# Patient Record
Sex: Male | Born: 1960 | Race: Black or African American | Hispanic: No | Marital: Single | Smoking: Current every day smoker
Health system: Southern US, Community
[De-identification: ages and names within clinical notes are randomized; demographics above are authoritative.]

## PROBLEM LIST (undated history)

## (undated) ENCOUNTER — Emergency Department (HOSPITAL_COMMUNITY): Payer: Medicare Other | Source: Home / Self Care

## (undated) DIAGNOSIS — D75829 Heparin-induced thrombocytopenia, unspecified: Secondary | ICD-10-CM

## (undated) DIAGNOSIS — M623 Immobility syndrome (paraplegic): Secondary | ICD-10-CM

## (undated) DIAGNOSIS — N319 Neuromuscular dysfunction of bladder, unspecified: Secondary | ICD-10-CM

## (undated) DIAGNOSIS — J1520 Pneumonia due to staphylococcus, unspecified: Secondary | ICD-10-CM

## (undated) DIAGNOSIS — F29 Unspecified psychosis not due to a substance or known physiological condition: Secondary | ICD-10-CM

## (undated) DIAGNOSIS — D61818 Other pancytopenia: Secondary | ICD-10-CM

## (undated) DIAGNOSIS — G839 Paralytic syndrome, unspecified: Secondary | ICD-10-CM

## (undated) DIAGNOSIS — D7582 Heparin induced thrombocytopenia (HIT): Secondary | ICD-10-CM

## (undated) DIAGNOSIS — L89159 Pressure ulcer of sacral region, unspecified stage: Secondary | ICD-10-CM

## (undated) DIAGNOSIS — M869 Osteomyelitis, unspecified: Secondary | ICD-10-CM

## (undated) HISTORY — PX: SMALL INTESTINE SURGERY: SHX150

## (undated) HISTORY — PX: HIP ARTHROTOMY: SUR105

---

## 2011-01-24 ENCOUNTER — Inpatient Hospital Stay (HOSPITAL_COMMUNITY)
Admission: EM | Admit: 2011-01-24 | Discharge: 2011-01-27 | DRG: 592 | Disposition: A | Discharge status: Home Health | Payer: Medicare Other | Attending: Internal Medicine | Admitting: Internal Medicine

## 2011-01-24 ENCOUNTER — Emergency Department (HOSPITAL_COMMUNITY): Payer: Medicare Other

## 2011-01-24 ENCOUNTER — Encounter: Payer: Self-pay | Admitting: Internal Medicine

## 2011-01-24 DIAGNOSIS — L8995 Pressure ulcer of unspecified site, unstageable: Secondary | ICD-10-CM | POA: Diagnosis present

## 2011-01-24 DIAGNOSIS — Z87828 Personal history of other (healed) physical injury and trauma: Secondary | ICD-10-CM

## 2011-01-24 DIAGNOSIS — G8929 Other chronic pain: Secondary | ICD-10-CM | POA: Diagnosis present

## 2011-01-24 DIAGNOSIS — Z228 Carrier of other infectious diseases: Secondary | ICD-10-CM

## 2011-01-24 DIAGNOSIS — M62838 Other muscle spasm: Secondary | ICD-10-CM | POA: Diagnosis present

## 2011-01-24 DIAGNOSIS — T45515A Adverse effect of anticoagulants, initial encounter: Secondary | ICD-10-CM | POA: Diagnosis present

## 2011-01-24 DIAGNOSIS — L89609 Pressure ulcer of unspecified heel, unspecified stage: Secondary | ICD-10-CM | POA: Diagnosis present

## 2011-01-24 DIAGNOSIS — F29 Unspecified psychosis not due to a substance or known physiological condition: Secondary | ICD-10-CM | POA: Diagnosis present

## 2011-01-24 DIAGNOSIS — L8994 Pressure ulcer of unspecified site, stage 4: Secondary | ICD-10-CM | POA: Diagnosis present

## 2011-01-24 DIAGNOSIS — G8220 Paraplegia, unspecified: Secondary | ICD-10-CM | POA: Diagnosis present

## 2011-01-24 DIAGNOSIS — L89109 Pressure ulcer of unspecified part of back, unspecified stage: Principal | ICD-10-CM | POA: Diagnosis present

## 2011-01-24 DIAGNOSIS — L89209 Pressure ulcer of unspecified hip, unspecified stage: Secondary | ICD-10-CM | POA: Diagnosis present

## 2011-01-24 DIAGNOSIS — N390 Urinary tract infection, site not specified: Secondary | ICD-10-CM

## 2011-01-24 LAB — DIFFERENTIAL
Basophils Absolute: 0 10*3/uL (ref 0.0–0.1)
Basophils Relative: 0 % (ref 0–1)
Eosinophils Absolute: 0.1 10*3/uL (ref 0.0–0.7)
Eosinophils Relative: 2 % (ref 0–5)
Lymphs Abs: 1.1 10*3/uL (ref 0.7–4.0)
Neutrophils Relative %: 66 % (ref 43–77)

## 2011-01-24 LAB — URINALYSIS, ROUTINE W REFLEX MICROSCOPIC
Glucose, UA: NEGATIVE mg/dL
Nitrite: POSITIVE — AB
Protein, ur: NEGATIVE mg/dL
pH: 7 (ref 5.0–8.0)

## 2011-01-24 LAB — CBC
Platelets: 111 10*3/uL — ABNORMAL LOW (ref 150–400)
RBC: 3.61 MIL/uL — ABNORMAL LOW (ref 4.22–5.81)
RDW: 18.4 % — ABNORMAL HIGH (ref 11.5–15.5)
WBC: 4.3 10*3/uL (ref 4.0–10.5)

## 2011-01-24 LAB — BASIC METABOLIC PANEL
CO2: 25 mEq/L (ref 19–32)
Chloride: 105 mEq/L (ref 96–112)
Potassium: 3.3 mEq/L — ABNORMAL LOW (ref 3.5–5.1)
Sodium: 139 mEq/L (ref 135–145)

## 2011-01-24 LAB — URINE MICROSCOPIC-ADD ON
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
## 2011-01-25 DIAGNOSIS — L89609 Pressure ulcer of unspecified heel, unspecified stage: Secondary | ICD-10-CM

## 2011-01-25 DIAGNOSIS — L89209 Pressure ulcer of unspecified hip, unspecified stage: Secondary | ICD-10-CM

## 2011-01-25 DIAGNOSIS — L8990 Pressure ulcer of unspecified site, unspecified stage: Secondary | ICD-10-CM

## 2011-01-25 DIAGNOSIS — R509 Fever, unspecified: Secondary | ICD-10-CM

## 2011-01-26 ENCOUNTER — Inpatient Hospital Stay (HOSPITAL_COMMUNITY): Payer: Medicare Other

## 2011-01-26 LAB — COMPREHENSIVE METABOLIC PANEL
ALT: 10 U/L (ref 0–53)
AST: 15 U/L (ref 0–37)
Albumin: 2.3 g/dL — ABNORMAL LOW (ref 3.5–5.2)
Alkaline Phosphatase: 131 U/L — ABNORMAL HIGH (ref 39–117)
Chloride: 105 mEq/L (ref 96–112)
Potassium: 4.5 mEq/L (ref 3.5–5.1)
Sodium: 137 mEq/L (ref 135–145)
Total Bilirubin: 0.3 mg/dL (ref 0.3–1.2)
Total Protein: 6.6 g/dL (ref 6.0–8.3)

## 2011-01-26 LAB — CBC
HCT: 28.7 % — ABNORMAL LOW (ref 39.0–52.0)
MCHC: 32.8 g/dL (ref 30.0–36.0)
Platelets: 101 10*3/uL — ABNORMAL LOW (ref 150–400)
RDW: 18.8 % — ABNORMAL HIGH (ref 11.5–15.5)
WBC: 4.8 10*3/uL (ref 4.0–10.5)

## 2011-01-26 LAB — PREALBUMIN: Prealbumin: 12.2 mg/dL — ABNORMAL LOW (ref 17.0–34.0)

## 2011-01-26 LAB — URINE CULTURE: Colony Count: >=100000

## 2011-01-27 ENCOUNTER — Inpatient Hospital Stay (HOSPITAL_COMMUNITY): Payer: Medicare Other

## 2011-01-27 DIAGNOSIS — N390 Urinary tract infection, site not specified: Secondary | ICD-10-CM

## 2011-01-27 LAB — BASIC METABOLIC PANEL
BUN: 14 mg/dL (ref 6–23)
Calcium: 9 mg/dL (ref 8.4–10.5)
Chloride: 104 mEq/L (ref 96–112)
Creatinine, Ser: <0.47 mg/dL — ABNORMAL LOW (ref 0.50–1.35)

## 2011-01-27 LAB — CBC
HCT: 28.6 % — ABNORMAL LOW (ref 39.0–52.0)
MCH: 26.5 pg (ref 26.0–34.0)
MCHC: 32.5 g/dL (ref 30.0–36.0)
MCV: 81.5 fL (ref 78.0–100.0)
Platelets: 133 10*3/uL — ABNORMAL LOW (ref 150–400)
RDW: 18.6 % — ABNORMAL HIGH (ref 11.5–15.5)
WBC: 4.6 10*3/uL (ref 4.0–10.5)

## 2011-01-30 LAB — CULTURE, BLOOD (ROUTINE X 2)
Culture  Setup Time: 201208101639
Culture  Setup Time: 201208101639
Culture: NO GROWTH
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
## 2013-04-09 ENCOUNTER — Encounter (HOSPITAL_COMMUNITY): Payer: Self-pay | Admitting: Emergency Medicine

## 2013-04-09 ENCOUNTER — Emergency Department (HOSPITAL_COMMUNITY)
Admission: EM | Admit: 2013-04-09 | Discharge: 2013-04-09 | Disposition: A | Discharge status: Skilled Nursing Facility | Payer: Medicare Other | Attending: Emergency Medicine | Admitting: Emergency Medicine

## 2013-04-09 DIAGNOSIS — T83010A Breakdown (mechanical) of cystostomy catheter, initial encounter: Secondary | ICD-10-CM

## 2013-04-09 DIAGNOSIS — Z880 Allergy status to penicillin: Secondary | ICD-10-CM | POA: Insufficient documentation

## 2013-04-09 DIAGNOSIS — F17200 Nicotine dependence, unspecified, uncomplicated: Secondary | ICD-10-CM | POA: Insufficient documentation

## 2013-04-09 DIAGNOSIS — Z466 Encounter for fitting and adjustment of urinary device: Secondary | ICD-10-CM | POA: Insufficient documentation

## 2013-04-09 DIAGNOSIS — M6289 Other specified disorders of muscle: Secondary | ICD-10-CM | POA: Insufficient documentation

## 2013-04-09 HISTORY — DX: Immobility syndrome (paraplegic): M62.3

## 2013-04-09 NOTE — ED Notes (Signed)
PT[facility] called  

## 2013-04-09 NOTE — ED Notes (Signed)
Pt SP catheter replaced by Dr Judd Lien. ([id number]).

## 2013-04-09 NOTE — ED Notes (Signed)
Pt from [facility], c/o needing his SP catheter changed. Pt reports that he has a UTI that he is being tx for at this time. Pt adds that he is having L side pain from the UTI. Pt requests that his SP catheter be changed. Pt is A&O and in NAD

## 2013-04-09 NOTE — ED Notes (Signed)
Per [facility], pt from [facility], c/o urination from penis, with suprapubic catheter in place. Pt currently being tx for UTI. Pt is a paraplegic pt. Pt  Is A&O and in NAD

## 2013-04-09 NOTE — ED Notes (Signed)
Bed: WA21 Expected date:  Expected time:  Means of arrival:  Comments: EMS Flank Pain with sp catheter
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
## 2014-05-23 ENCOUNTER — Encounter (HOSPITAL_COMMUNITY): Payer: Self-pay | Admitting: Emergency Medicine

## 2014-05-23 ENCOUNTER — Emergency Department (HOSPITAL_COMMUNITY)
Admission: EM | Admit: 2014-05-23 | Discharge: 2014-05-24 | Disposition: A | Discharge status: Home / Self Care | Payer: Medicare Other | Attending: Emergency Medicine | Admitting: Emergency Medicine

## 2014-05-23 DIAGNOSIS — Z880 Allergy status to penicillin: Secondary | ICD-10-CM | POA: Insufficient documentation

## 2014-05-23 DIAGNOSIS — Z8669 Personal history of other diseases of the nervous system and sense organs: Secondary | ICD-10-CM | POA: Diagnosis not present

## 2014-05-23 DIAGNOSIS — R109 Unspecified abdominal pain: Secondary | ICD-10-CM

## 2014-05-23 DIAGNOSIS — Z720 Tobacco use: Secondary | ICD-10-CM | POA: Diagnosis not present

## 2014-05-23 DIAGNOSIS — K922 Gastrointestinal hemorrhage, unspecified: Secondary | ICD-10-CM | POA: Insufficient documentation

## 2014-05-23 DIAGNOSIS — Z792 Long term (current) use of antibiotics: Secondary | ICD-10-CM | POA: Diagnosis not present

## 2014-05-23 DIAGNOSIS — Z79899 Other long term (current) drug therapy: Secondary | ICD-10-CM | POA: Insufficient documentation

## 2014-05-23 HISTORY — DX: Paralytic syndrome, unspecified: G83.9

## 2014-05-23 LAB — COMPREHENSIVE METABOLIC PANEL
ALT: 15 U/L (ref 0–53)
AST: 18 U/L (ref 0–37)
Albumin: 3.2 g/dL — ABNORMAL LOW (ref 3.5–5.2)
Alkaline Phosphatase: 110 U/L (ref 39–117)
Anion gap: 16 — ABNORMAL HIGH (ref 5–15)
BUN: 14 mg/dL (ref 6–23)
CALCIUM: 9.8 mg/dL (ref 8.4–10.5)
CO2: 22 meq/L (ref 19–32)
CREATININE: 0.58 mg/dL (ref 0.50–1.35)
Chloride: 97 mEq/L (ref 96–112)
GFR calc Af Amer: >90 mL/min (ref >90)
GLUCOSE: 99 mg/dL (ref 70–99)
Potassium: 3.5 mEq/L — ABNORMAL LOW (ref 3.7–5.3)
Sodium: 135 mEq/L — ABNORMAL LOW (ref 137–147)
Total Bilirubin: 0.6 mg/dL (ref 0.3–1.2)
Total Protein: 7.7 g/dL (ref 6.0–8.3)

## 2014-05-23 LAB — CBC WITH DIFFERENTIAL/PLATELET
Basophils Absolute: 0 10*3/uL (ref 0.0–0.1)
Basophils Relative: 0 % (ref 0–1)
EOS PCT: 1 % (ref 0–5)
Eosinophils Absolute: 0.1 10*3/uL (ref 0.0–0.7)
HEMATOCRIT: 38.3 % — AB (ref 39.0–52.0)
Hemoglobin: 12.6 g/dL — ABNORMAL LOW (ref 13.0–17.0)
LYMPHS ABS: 0.9 10*3/uL (ref 0.7–4.0)
Lymphocytes Relative: 11 % — ABNORMAL LOW (ref 12–46)
MCH: 26.6 pg (ref 26.0–34.0)
MCHC: 32.9 g/dL (ref 30.0–36.0)
MCV: 80.8 fL (ref 78.0–100.0)
MONOS PCT: 5 % (ref 3–12)
Monocytes Absolute: 0.4 10*3/uL (ref 0.1–1.0)
Neutro Abs: 6.7 10*3/uL (ref 1.7–7.7)
Neutrophils Relative %: 83 % — ABNORMAL HIGH (ref 43–77)
Platelets: 122 10*3/uL — ABNORMAL LOW (ref 150–400)
RBC: 4.74 MIL/uL (ref 4.22–5.81)
RDW: 15.8 % — ABNORMAL HIGH (ref 11.5–15.5)
WBC: 8.1 10*3/uL (ref 4.0–10.5)

## 2014-05-23 LAB — URINALYSIS, ROUTINE W REFLEX MICROSCOPIC
Bilirubin Urine: NEGATIVE
Glucose, UA: NEGATIVE mg/dL
Ketones, ur: NEGATIVE mg/dL
Nitrite: POSITIVE — AB
PH: 5.5 (ref 5.0–8.0)
PROTEIN: NEGATIVE mg/dL
Specific Gravity, Urine: 1.013 (ref 1.005–1.030)
Urobilinogen, UA: 0.2 mg/dL (ref 0.0–1.0)

## 2014-05-23 LAB — I-STAT CG4 LACTIC ACID, ED: LACTIC ACID, VENOUS: 1.15 mmol/L (ref 0.5–2.2)

## 2014-05-23 LAB — URINE MICROSCOPIC-ADD ON

## 2014-05-23 LAB — TYPE AND SCREEN
ABO/RH(D): A NEG
Antibody Screen: NEGATIVE

## 2014-05-23 MED ORDER — FENTANYL CITRATE 0.05 MG/ML IJ SOLN
50.0000 ug | Freq: Once | INTRAMUSCULAR | Status: AC
  Administered 2014-05-23: 50 ug via INTRAVENOUS
  Filled 2014-05-23: qty 2

## 2014-05-23 MED ORDER — SODIUM CHLORIDE 0.9 % IV SOLN
1000.0000 mL | Freq: Once | INTRAVENOUS | Status: AC
  Administered 2014-05-23: 1000 mL via INTRAVENOUS

## 2014-05-23 MED ORDER — FENTANYL CITRATE 0.05 MG/ML IJ SOLN
50.0000 ug | Freq: Once | INTRAMUSCULAR | Status: AC
  Administered 2014-05-23: 50 ug via INTRAVENOUS

## 2014-05-23 MED ORDER — ONDANSETRON HCL 4 MG/2ML IJ SOLN
4.0000 mg | Freq: Once | INTRAMUSCULAR | Status: AC
  Administered 2014-05-23: 4 mg via INTRAVENOUS
  Filled 2014-05-23: qty 2

## 2014-05-23 MED ORDER — FENTANYL CITRATE 0.05 MG/ML IJ SOLN
100.0000 ug | Freq: Once | INTRAMUSCULAR | Status: DC
  Filled 2014-05-23: qty 2

## 2014-05-23 NOTE — ED Notes (Signed)
Report to tiffany, rn.  Pt care transferred 

## 2014-05-23 NOTE — ED Notes (Signed)
Per ems, called to patient facility for multiple episodes of coffee ground emesis since 5pm. With abdominal pain.  Pt arrives awake, alert, oriented, c/o abdominal pain
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
## 2014-05-24 ENCOUNTER — Telehealth: Payer: Self-pay | Admitting: *Deleted

## 2014-05-24 ENCOUNTER — Encounter (HOSPITAL_COMMUNITY): Payer: Self-pay

## 2014-05-24 ENCOUNTER — Emergency Department (HOSPITAL_COMMUNITY): Payer: Medicare Other

## 2014-05-24 DIAGNOSIS — K922 Gastrointestinal hemorrhage, unspecified: Secondary | ICD-10-CM | POA: Diagnosis not present

## 2014-05-24 LAB — ABO/RH: ABO/RH(D): A NEG

## 2014-05-24 MED ORDER — SODIUM CHLORIDE 0.9 % IV SOLN
8.0000 mg/h | INTRAVENOUS | Status: DC
  Administered 2014-05-24: 8 mg/h via INTRAVENOUS
  Filled 2014-05-24 (×2): qty 80

## 2014-05-24 MED ORDER — PANTOPRAZOLE SODIUM 40 MG IV SOLR: 40.0000 mg | Freq: Two times a day (BID) | INTRAVENOUS | Status: DC

## 2014-05-24 MED ORDER — MORPHINE SULFATE 4 MG/ML IJ SOLN
4.0000 mg | Freq: Once | INTRAMUSCULAR | Status: AC
  Administered 2014-05-24: 4 mg via INTRAVENOUS
  Filled 2014-05-24: qty 1

## 2014-05-24 MED ORDER — IOHEXOL 300 MG/ML  SOLN
25.0000 mL | Freq: Once | INTRAMUSCULAR | Status: AC | PRN
  Administered 2014-05-24: 25 mL via ORAL

## 2014-05-24 MED ORDER — SODIUM CHLORIDE 0.9 % IV SOLN
80.0000 mg | Freq: Once | INTRAVENOUS | Status: AC
  Administered 2014-05-24: 80 mg via INTRAVENOUS
  Filled 2014-05-24: qty 80

## 2014-05-24 MED ORDER — IOHEXOL 300 MG/ML  SOLN
100.0000 mL | Freq: Once | INTRAMUSCULAR | Status: AC | PRN
  Administered 2014-05-24: 100 mL via INTRAVENOUS

## 2014-05-24 MED ORDER — ONDANSETRON HCL 4 MG/2ML IJ SOLN
4.0000 mg | Freq: Once | INTRAMUSCULAR | Status: AC
  Administered 2014-05-24: 4 mg via INTRAVENOUS
  Filled 2014-05-24: qty 2

## 2014-05-24 MED ORDER — POTASSIUM CHLORIDE IN NACL 20-0.9 MEQ/L-% IV SOLN
Freq: Once | INTRAVENOUS | Status: AC
  Administered 2014-05-24: 01:00:00 via INTRAVENOUS
  Filled 2014-05-24: qty 1000

## 2014-05-24 NOTE — ED Notes (Signed)
Dr. Glick at bedside.  

## 2014-05-24 NOTE — Discharge Instructions (Signed)
Return if symptoms are getting worse.   Abdominal Pain Many things can cause abdominal pain. Usually, abdominal pain is not caused by a disease and will improve without treatment. It can often be observed and treated at home. Your health care provider will do a physical exam and possibly order blood tests and X-rays to help determine the seriousness of your pain. However, in many cases, more time must pass before a clear cause of the pain can be found. Before that point, your health care provider may not know if you need more testing or further treatment. HOME CARE INSTRUCTIONS  Monitor your abdominal pain for any changes. The following actions may help to alleviate any discomfort you are experiencing:  Only take over-the-counter or prescription medicines as directed by your health care provider.  Do not take laxatives unless directed to do so by your health care provider.  Try a clear liquid diet (broth, tea, or water) as directed by your health care provider. Slowly move to a bland diet as tolerated. SEEK MEDICAL CARE IF:  You have unexplained abdominal pain.  You have abdominal pain associated with nausea or diarrhea.  You have pain when you urinate or have a bowel movement.  You experience abdominal pain that wakes you in the night.  You have abdominal pain that is worsened or improved by eating food.  You have abdominal pain that is worsened with eating fatty foods.  You have a fever. SEEK IMMEDIATE MEDICAL CARE IF:   Your pain does not go away within 2 hours.  You keep throwing up (vomiting).  Your pain is felt only in portions of the abdomen, such as the right side or the left lower portion of the abdomen.  You pass bloody or black tarry stools. MAKE SURE YOU:  Understand these instructions.   Will watch your condition.   Will get help right away if you are not doing well or get worse.  Document Released: 03/12/2005 Document Revised: 06/07/2013 Document Reviewed:  02/09/2013 [facility] Patient Information 2015 [employer], [place]. This information is not intended to replace advice given to you by your health care provider. Make sure you discuss any questions you have with your health care provider.

## 2014-05-24 NOTE — Telephone Encounter (Signed)
LPN requesting results of CT exam and medication changes if any.  NCM reviewed chart, relayed CT exam unremarkable and no medication changes noted.

## 2014-10-05 ENCOUNTER — Inpatient Hospital Stay (HOSPITAL_COMMUNITY)
Admission: EM | Admit: 2014-10-05 | Discharge: 2014-10-10 | DRG: 178 | Disposition: A | Discharge status: Skilled Nursing Facility | Payer: Medicare Other | Attending: Family Medicine | Admitting: Family Medicine

## 2014-10-05 ENCOUNTER — Emergency Department (HOSPITAL_COMMUNITY): Payer: Medicare Other

## 2014-10-05 ENCOUNTER — Encounter (HOSPITAL_COMMUNITY): Payer: Self-pay | Admitting: Emergency Medicine

## 2014-10-05 ENCOUNTER — Other Ambulatory Visit (HOSPITAL_COMMUNITY): Payer: Self-pay

## 2014-10-05 DIAGNOSIS — Y95 Nosocomial condition: Secondary | ICD-10-CM | POA: Diagnosis present

## 2014-10-05 DIAGNOSIS — G8220 Paraplegia, unspecified: Secondary | ICD-10-CM | POA: Diagnosis present

## 2014-10-05 DIAGNOSIS — R05 Cough: Secondary | ICD-10-CM | POA: Diagnosis present

## 2014-10-05 DIAGNOSIS — R042 Hemoptysis: Secondary | ICD-10-CM | POA: Diagnosis not present

## 2014-10-05 DIAGNOSIS — L89159 Pressure ulcer of sacral region, unspecified stage: Secondary | ICD-10-CM | POA: Diagnosis not present

## 2014-10-05 DIAGNOSIS — Z79899 Other long term (current) drug therapy: Secondary | ICD-10-CM

## 2014-10-05 DIAGNOSIS — J80 Acute respiratory distress syndrome: Secondary | ICD-10-CM | POA: Diagnosis present

## 2014-10-05 DIAGNOSIS — E876 Hypokalemia: Secondary | ICD-10-CM | POA: Diagnosis present

## 2014-10-05 DIAGNOSIS — F17210 Nicotine dependence, cigarettes, uncomplicated: Secondary | ICD-10-CM | POA: Diagnosis present

## 2014-10-05 DIAGNOSIS — E860 Dehydration: Secondary | ICD-10-CM | POA: Diagnosis present

## 2014-10-05 DIAGNOSIS — G8929 Other chronic pain: Secondary | ICD-10-CM | POA: Diagnosis present

## 2014-10-05 DIAGNOSIS — J1520 Pneumonia due to staphylococcus, unspecified: Secondary | ICD-10-CM | POA: Diagnosis not present

## 2014-10-05 DIAGNOSIS — J189 Pneumonia, unspecified organism: Secondary | ICD-10-CM | POA: Diagnosis present

## 2014-10-05 DIAGNOSIS — R0600 Dyspnea, unspecified: Secondary | ICD-10-CM

## 2014-10-05 DIAGNOSIS — R059 Cough, unspecified: Secondary | ICD-10-CM | POA: Diagnosis present

## 2014-10-05 DIAGNOSIS — J15212 Pneumonia due to Methicillin resistant Staphylococcus aureus: Principal | ICD-10-CM | POA: Diagnosis present

## 2014-10-05 HISTORY — DX: Pressure ulcer of sacral region, unspecified stage: L89.159

## 2014-10-05 LAB — CBC
HCT: 37.5 % — ABNORMAL LOW (ref 39.0–52.0)
Hemoglobin: 12.6 g/dL — ABNORMAL LOW (ref 13.0–17.0)
MCH: 27.3 pg (ref 26.0–34.0)
MCHC: 33.6 g/dL (ref 30.0–36.0)
MCV: 81.2 fL (ref 78.0–100.0)
PLATELETS: 116 10*3/uL — AB (ref 150–400)
RBC: 4.62 MIL/uL (ref 4.22–5.81)
RDW: 14.1 % (ref 11.5–15.5)
WBC: 4.7 10*3/uL (ref 4.0–10.5)

## 2014-10-05 LAB — BASIC METABOLIC PANEL
ANION GAP: 8 (ref 5–15)
BUN: 5 mg/dL — ABNORMAL LOW (ref 6–23)
CALCIUM: 8.2 mg/dL — AB (ref 8.4–10.5)
CO2: 21 mmol/L (ref 19–32)
CREATININE: 0.43 mg/dL — AB (ref 0.50–1.35)
Chloride: 106 mmol/L (ref 96–112)
GFR calc Af Amer: >90 mL/min (ref >90)
Glucose, Bld: 111 mg/dL — ABNORMAL HIGH (ref 70–99)
POTASSIUM: 3.5 mmol/L (ref 3.5–5.1)
Sodium: 135 mmol/L (ref 135–145)

## 2014-10-05 LAB — URINALYSIS, ROUTINE W REFLEX MICROSCOPIC
GLUCOSE, UA: NEGATIVE mg/dL
Ketones, ur: 15 mg/dL — AB
Nitrite: POSITIVE — AB
PROTEIN: NEGATIVE mg/dL
SPECIFIC GRAVITY, URINE: 1.015 (ref 1.005–1.030)
pH: 6.5 (ref 5.0–8.0)

## 2014-10-05 LAB — URINE MICROSCOPIC-ADD ON

## 2014-10-05 LAB — I-STAT TROPONIN, ED: Troponin i, poc: 0 ng/mL (ref 0.00–0.08)

## 2014-10-05 LAB — I-STAT CG4 LACTIC ACID, ED: Lactic Acid, Venous: 0.71 mmol/L (ref 0.5–2.0)

## 2014-10-05 MED ORDER — ACETAMINOPHEN 325 MG PO TABS: 650.0000 mg | ORAL_TABLET | ORAL | Status: DC | PRN

## 2014-10-05 MED ORDER — FENTANYL 50 MCG/HR TD PT72
50.0000 ug | MEDICATED_PATCH | TRANSDERMAL | Status: DC
  Administered 2014-10-06 – 2014-10-09 (×2): 50 ug via TRANSDERMAL
  Filled 2014-10-05 (×2): qty 1

## 2014-10-05 MED ORDER — OXYBUTYNIN CHLORIDE 5 MG PO TABS
5.0000 mg | ORAL_TABLET | Freq: Four times a day (QID) | ORAL | Status: DC
  Administered 2014-10-05 – 2014-10-07 (×8): 5 mg via ORAL
  Filled 2014-10-05 (×16): qty 1

## 2014-10-05 MED ORDER — LORAZEPAM 0.5 MG PO TABS
0.5000 mg | ORAL_TABLET | Freq: Four times a day (QID) | ORAL | Status: DC | PRN
  Administered 2014-10-06 – 2014-10-08 (×2): 0.5 mg via ORAL
  Filled 2014-10-05 (×3): qty 1

## 2014-10-05 MED ORDER — LORATADINE 10 MG PO TABS
10.0000 mg | ORAL_TABLET | Freq: Every day | ORAL | Status: DC
  Administered 2014-10-05 – 2014-10-07 (×3): 10 mg via ORAL
  Filled 2014-10-05 (×4): qty 1

## 2014-10-05 MED ORDER — LINACLOTIDE 145 MCG PO CAPS
145.0000 ug | ORAL_CAPSULE | Freq: Every day | ORAL | Status: DC
  Administered 2014-10-06 – 2014-10-10 (×5): 145 ug via ORAL
  Filled 2014-10-05 (×6): qty 1

## 2014-10-05 MED ORDER — DEXTROSE 5 % IV SOLN
1.0000 g | Freq: Three times a day (TID) | INTRAVENOUS | Status: DC
  Administered 2014-10-05 – 2014-10-10 (×15): 1 g via INTRAVENOUS
  Filled 2014-10-05 (×17): qty 1

## 2014-10-05 MED ORDER — FLEET ENEMA 7-19 GM/118ML RE ENEM: 1.0000 | ENEMA | RECTAL | Status: DC | PRN

## 2014-10-05 MED ORDER — OXYCODONE HCL 5 MG PO TABS
15.0000 mg | ORAL_TABLET | ORAL | Status: DC | PRN
  Administered 2014-10-05: 15 mg via ORAL
  Filled 2014-10-05: qty 3

## 2014-10-05 MED ORDER — SODIUM CHLORIDE 0.9 % IV SOLN
INTRAVENOUS | Status: AC
  Administered 2014-10-05 – 2014-10-06 (×2): via INTRAVENOUS

## 2014-10-05 MED ORDER — PREGABALIN 100 MG PO CAPS
100.0000 mg | ORAL_CAPSULE | Freq: Two times a day (BID) | ORAL | Status: DC
  Administered 2014-10-05 – 2014-10-07 (×5): 100 mg via ORAL
  Filled 2014-10-05 (×2): qty 1
  Filled 2014-10-05: qty 2
  Filled 2014-10-05 (×3): qty 1

## 2014-10-05 MED ORDER — OXYCODONE HCL 5 MG PO TABS
15.0000 mg | ORAL_TABLET | ORAL | Status: DC | PRN
  Administered 2014-10-05 – 2014-10-10 (×17): 15 mg via ORAL
  Filled 2014-10-05 (×18): qty 3

## 2014-10-05 MED ORDER — POLYETHYLENE GLYCOL 3350 17 G PO PACK
17.0000 g | PACK | Freq: Every day | ORAL | Status: DC | PRN
Start: 2014-10-05 — End: 2014-10-10

## 2014-10-05 MED ORDER — ROPINIROLE HCL 0.5 MG PO TABS
0.5000 mg | ORAL_TABLET | Freq: Every day | ORAL | Status: DC
  Administered 2014-10-05 – 2014-10-09 (×5): 0.5 mg via ORAL
  Filled 2014-10-05 (×6): qty 1

## 2014-10-05 MED ORDER — BACLOFEN 10 MG PO TABS
10.0000 mg | ORAL_TABLET | Freq: Three times a day (TID) | ORAL | Status: DC
  Administered 2014-10-05 – 2014-10-07 (×8): 10 mg via ORAL
  Filled 2014-10-05 (×12): qty 1

## 2014-10-05 MED ORDER — POTASSIUM CHLORIDE CRYS ER 10 MEQ PO TBCR
10.0000 meq | EXTENDED_RELEASE_TABLET | Freq: Two times a day (BID) | ORAL | Status: DC
  Administered 2014-10-05 – 2014-10-07 (×5): 10 meq via ORAL
  Filled 2014-10-05 (×8): qty 1

## 2014-10-05 MED ORDER — DOXYCYCLINE HYCLATE 100 MG IV SOLR
100.0000 mg | Freq: Two times a day (BID) | INTRAVENOUS | Status: DC
  Administered 2014-10-05 – 2014-10-06 (×3): 100 mg via INTRAVENOUS
  Filled 2014-10-05 (×3): qty 100

## 2014-10-05 MED ORDER — BISACODYL 10 MG RE SUPP: 10.0000 mg | Freq: Every day | RECTAL | Status: DC | PRN

## 2014-10-05 MED ORDER — ONDANSETRON HCL 4 MG/2ML IJ SOLN: 4.0000 mg | Freq: Four times a day (QID) | INTRAMUSCULAR | Status: DC | PRN

## 2014-10-05 MED ORDER — FENTANYL 75 MCG/HR TD PT72
75.0000 ug | MEDICATED_PATCH | TRANSDERMAL | Status: DC
  Administered 2014-10-06 – 2014-10-09 (×2): 75 ug via TRANSDERMAL
  Filled 2014-10-05 (×2): qty 1

## 2014-10-05 MED ORDER — DIPHENHYDRAMINE HCL 25 MG PO CAPS
25.0000 mg | ORAL_CAPSULE | ORAL | Status: DC | PRN
Start: 2014-10-05 — End: 2014-10-10
  Filled 2014-10-05: qty 1

## 2014-10-05 MED ORDER — ENOXAPARIN SODIUM 40 MG/0.4ML ~~LOC~~ SOLN
40.0000 mg | SUBCUTANEOUS | Status: DC
  Administered 2014-10-05 – 2014-10-09 (×5): 40 mg via SUBCUTANEOUS
  Filled 2014-10-05 (×6): qty 0.4

## 2014-10-05 MED ORDER — ONDANSETRON HCL 4 MG PO TABS: 4.0000 mg | ORAL_TABLET | Freq: Four times a day (QID) | ORAL | Status: DC | PRN

## 2014-10-05 NOTE — ED Notes (Signed)
Hospitalist at bedside. Will reassess pt vital signs with completion of exam.
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
## 2014-10-05 NOTE — ED Notes (Signed)
Alan Warner at bedside attempt IV insertion and second set of blood cultures.

## 2014-10-05 NOTE — ED Notes (Signed)
Two unsuccessful IV attempts by this RN; able to draw one set of blood cultures from a site; otherwise unsuccessful.

## 2014-10-05 NOTE — ED Notes (Signed)
Patient has suprapubic cath. Will get urine from that
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
## 2014-10-05 NOTE — ED Notes (Signed)
Per EMS pt complaint of SOB for a week; chest DG result 2 days ago unremarkable per staff.

## 2014-10-05 NOTE — ED Notes (Signed)
Upon further assessment pt reports productive cough and worsening SOB. Pt reports ONLY pain at present time is chronic right hip pain related to not receiving morning pain medication yet.

## 2014-10-05 NOTE — ED Notes (Signed)
Pt transported to DG.  

## 2014-10-05 NOTE — ED Notes (Signed)
Bed: [id number] Expected date:  Expected time:  Means of arrival:  Comments: Ems nursing home sob

## 2014-10-05 NOTE — ED Notes (Signed)
Pt resting with eyes closed at present time

## 2014-10-05 NOTE — H&P (Signed)
[facility] History and Physical  MARISSA LOWREY DXI:[id number] DOB: [birth year] DOA: 10/05/2014   PCP: Alan Brothers, MD  Specialists: Wound care center  Chief Complaint: Cough for the past few days  HPI: Alan Warner is a 54 y.o. male with a past medical history of paraplegia, chronic pain, sacral decubitus, which has healed, who comes into the hospital with complaints of cough for the last 2-3 days. He had a chest x-ray a few days ago, which did not show any pneumonia. He has been coughing up greenish brownish expectoration. He lives in a skilled nursing facility. He denies any fever, chills. This morning he was placed on oxygen. And he was then set sent over to the hospital for further management. He denies any nausea, vomiting, but has had dry heaves. No abdominal pain. No diarrhea. Denies any nasal drainage. He denies any chest pain. Has had a few episodes of shortness of breath, but none currently. His cough appears to be improved over the course of the last fewhours. He had a low-grade fever in the hospital. He was saturating 90-92 percent on 2 L by nasal cannula.  Home Medications: Prior to Admission medications   Medication Sig Start Date End Date Taking? Authorizing Provider  acetaminophen (TYLENOL) 325 MG tablet Take 650 mg by mouth every 4 (four) hours as needed for pain or fever.   Yes Historical Provider, MD  baclofen (LIORESAL) 10 MG tablet Take 10 mg by mouth 3 (three) times daily.   Yes Historical Provider, MD  bisacodyl (DULCOLAX) 10 MG suppository Place 10 mg rectally daily as needed for constipation.   Yes Historical Provider, MD  diphenhydrAMINE (BENADRYL) 25 MG tablet Take 25 mg by mouth every 4 (four) hours as needed for itching.   Yes Historical Provider, MD  fentaNYL (DURAGESIC - DOSED MCG/HR) 50 MCG/HR Place 1 patch onto the skin every 3 (three) days. APPLY WITH 75 MCG PATCH FOR A TOTAL OF 125 MCG   Yes Historical Provider, MD  fentaNYL (DURAGESIC - DOSED MCG/HR) 75  MCG/HR Place 1 patch onto the skin every 3 (three) days. APPLY WITH 50 MCG PATCH FOR A TOTAL OF 125 MCG   Yes Historical Provider, MD  Linaclotide (LINZESS) 145 MCG CAPS capsule Take 145 mcg by mouth daily with breakfast.   Yes Historical Provider, MD  loratadine (CLARITIN) 10 MG tablet Take 10 mg by mouth daily.   Yes Historical Provider, MD  LORazepam (ATIVAN) 0.5 MG tablet Take 0.5 mg by mouth every 6 (six) hours as needed for anxiety.   Yes Historical Provider, MD  oxybutynin (DITROPAN) 5 MG tablet Take 5 mg by mouth 4 (four) times daily.   Yes Historical Provider, MD  oxyCODONE (ROXICODONE) 15 MG immediate release tablet Take 15 mg by mouth every 4 (four) hours as needed for pain.   Yes Historical Provider, MD  polyethylene glycol (MIRALAX / GLYCOLAX) packet Take 17 g by mouth daily as needed for mild constipation. Takes scheduled every day, may also have 1 more packet a day if needed   Yes Historical Provider, MD  potassium chloride (K-DUR,KLOR-CON) 10 MEQ tablet Take 10 mEq by mouth 2 (two) times daily.   Yes Historical Provider, MD  pregabalin (LYRICA) 100 MG capsule Take 100 mg by mouth 2 (two) times daily.   Yes Historical Provider, MD  pseudoephedrine (SUDAFED) 60 MG tablet Take 60 mg by mouth every 6 (six) hours as needed for congestion.   Yes Historical Provider, MD  rOPINIRole (REQUIP) 0.5  MG tablet Take 0.5 mg by mouth at bedtime.   Yes Historical Provider, MD  sodium phosphate (FLEET) enema Place 1 enema rectally as needed (for constipation). follow package directions   Yes Historical Provider, MD    Allergies:  Allergies  Allergen Reactions  . Levaquin [Levofloxacin In D5w]     PER MEDICATION MAR  . Penicillins     PER MEDICATION MAR  . Vancomycin Rash    PER MEDICATION MAR    Past Medical History: Past Medical History  Diagnosis Date  . Immobile, syndrome paraplegic   . 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 Hip arthrotomy Right    . Small intestine surgery      Social History: Lives in a skilled nursing facility. Smokes 3-4 cigarettes every day. No alcohol use. No illicit drug use. He is paraplegic  Family History:  Family History  Problem Relation Age of Onset  . Sinusitis Sister      Review of Systems - History obtained from the patient General ROS: positive for  - fatigue Psychological ROS: negative Ophthalmic ROS: negative ENT ROS: negative Allergy and Immunology ROS: negative Hematological and Lymphatic ROS: negative Endocrine ROS: negative Respiratory ROS: as in hpi Cardiovascular ROS: as in hpi Gastrointestinal ROS: no abdominal pain, change in bowel habits, or black or bloody stools Genito-Urinary ROS: chronic suprapubic catheter Musculoskeletal ROS: negative Neurological ROS: paraplegic Dermatological ROS: negative  Physical Examination  Filed Vitals:   10/05/14 1130 10/05/14 1200 10/05/14 1322 10/05/14 1428  BP: 142/97 141/85 130/77 98/54  Pulse: 70 79 83 80  Temp:    99.6 F (37.6 C)  TempSrc:   Rectal Oral  Resp: _0 Height:    _1  (1.88 m)  Weight:    79.379 kg (175 lb)  SpO2: 97% 96% 97% 98%    BP 98/54 mmHg  Pulse 80  Temp(Src) 99.6 F (37.6 C) (Oral)  Resp 16  Ht _2  (1.88 m)  Wt 79.379 kg (175 lb)  BMI 22.46 kg/m2  SpO2 98%  General appearance: alert, cooperative, appears stated age and no distress Head: Normocephalic, without obvious abnormality, atraumatic Eyes: conjunctivae/corneas clear. PERRL, EOM's intact.  Throat: Dry mucous membrane. No obvious lesions. Neck: no adenopathy, no carotid bruit, no JVD, supple, symmetrical, trachea midline and thyroid not enlarged, symmetric, no tenderness/mass/nodules Back: symmetric, no curvature. ROM normal. No CVA tenderness. Resp: Decreased air entry at the bases with coarse breath sounds. No obvious wheezing or rhonchi. No definite crackles. Cardio: regular rate and rhythm, S1, S2 normal, no murmur, click, rub  or gallop GI: soft, non-tender; bowel sounds normal; no masses,  no organomegaly Extremities: extremities normal, atraumatic, no cyanosis or edema Pulses: 2+ and symmetric Skin: Healed sacral decubitus Lymph nodes: Cervical, supraclavicular, and axillary nodes normal. Neurologic: Awake and alert. Oriented 3. Paraplegic.  Laboratory Data: Results for orders placed or performed during the hospital encounter of 10/05/14 (from the past 48 hour(s))  Basic metabolic panel    (if pt has PMH of COPD)     Status: Abnormal   Collection Time: 10/05/14  9:56 AM  Result Value Ref Range   Sodium 135 135 - 145 mmol/L   Potassium 3.5 3.5 - 5.1 mmol/L   Chloride 106 96 - 112 mmol/L   CO2 21 19 - 32 mmol/L   Glucose, Bld 111 (H) 70 - 99 mg/dL   BUN 5 (L) 6 - 23 mg/dL  Creatinine, Ser 0.43 (L) 0.50 - 1.35 mg/dL   Calcium 8.2 (L) 8.4 - 10.5 mg/dL   GFR calc non Af Amer >90 >90 mL/min   GFR calc Af Amer >90 >90 mL/min    Comment: (NOTE) The eGFR has been calculated using the CKD EPI equation. This calculation has not been validated in all clinical situations. eGFR's persistently <90 mL/min signify possible Chronic Kidney Disease.    Anion gap 8 5 - 15  CBC     (if pt has PMH of COPD)     Status: Abnormal   Collection Time: 10/05/14  9:56 AM  Result Value Ref Range   WBC 4.7 4.0 - 10.5 K/uL   RBC 4.62 4.22 - 5.81 MIL/uL   Hemoglobin 12.6 (L) 13.0 - 17.0 g/dL   HCT 37.5 (L) 39.0 - 52.0 %   MCV 81.2 78.0 - 100.0 fL   MCH 27.3 26.0 - 34.0 pg   MCHC 33.6 30.0 - 36.0 g/dL   RDW 14.1 11.5 - 15.5 %   Platelets 116 (L) 150 - 400 K/uL    Comment: SPECIMEN CHECKED FOR CLOTS REPEATED TO VERIFY PLATELET COUNT CONFIRMED BY SMEAR   I-Stat CG4 Lactic Acid, ED     Status: None   Collection Time: 10/05/14 10:03 AM  Result Value Ref Range   Lactic Acid, Venous 0.71 0.5 - 2.0 mmol/L  I-stat troponin, ED (if patient has history of COPD)     Status: None   Collection Time: 10/05/14 10:04 AM  Result Value  Ref Range   Troponin i, poc 0.00 0.00 - 0.08 ng/mL   Comment 3            Comment: Due to the release kinetics of cTnI, a negative result within the first hours of the onset of symptoms does not rule out myocardial infarction with certainty. If myocardial infarction is still suspected, repeat the test at appropriate intervals.   Urinalysis, Routine w reflex microscopic     Status: Abnormal   Collection Time: 10/05/14 11:50 AM  Result Value Ref Range   Color, Urine AMBER (A) YELLOW    Comment: BIOCHEMICALS MAY BE AFFECTED BY COLOR   APPearance TURBID (A) CLEAR   Specific Gravity, Urine 1.015 1.005 - 1.030   pH 6.5 5.0 - 8.0   Glucose, UA NEGATIVE NEGATIVE mg/dL   Hgb urine dipstick SMALL (A) NEGATIVE   Bilirubin Urine SMALL (A) NEGATIVE   Ketones, ur 15 (A) NEGATIVE mg/dL   Protein, ur NEGATIVE NEGATIVE mg/dL   Urobilinogen, UA >8.0 (H) 0.0 - 1.0 mg/dL   Nitrite POSITIVE (A) NEGATIVE   Leukocytes, UA LARGE (A) NEGATIVE  Urine microscopic-add on     Status: Abnormal   Collection Time: 10/05/14 11:50 AM  Result Value Ref Range   Squamous Epithelial / LPF FEW (A) RARE   WBC, UA 21-50 <3 WBC/hpf   RBC / HPF 3-6 <3 RBC/hpf   Bacteria, UA MANY (A) RARE    Radiology Reports: Dg Chest 2 View (if Patient Has Fever And/or Copd)  10/05/2014   CLINICAL DATA:  Shortness of breath, productive cough  EXAM: CHEST  2 VIEW  COMPARISON:  06/26/2012  FINDINGS: Cardiomediastinal silhouette is unremarkable. Mild thoracic dextroscoliosis. No acute infiltrate or pleural effusion. No pulmonary edema.  IMPRESSION: No active cardiopulmonary disease.   Electronically Signed   By: Lahoma Crocker M.D.   On: 10/05/2014 11:32    Electrocardiogram: Sinus rhythm at 87 bpm. Normal axis. Intervals are normal. No concerning  ST or T-wave changes. Poor quality EKG.  Problem List  Principal Problem:   HCAP (healthcare-associated pneumonia) Active Problems:   Cough   Paraplegia   Chronic pain   Sacral decubitus  ulcer   Assessment: This is a 54 year old African-American male who is paraplegic and lives in a skilled nursing facility. Comes in with cough with greenish expectoration for the past few days. Has had low-grade fever. He usually does not use oxygen at home but was placed on oxygen this morning. X-ray does not show any pneumonia. Patient appears to be slightly dehydrated. This is either pneumonia, which is not yet evident on x-ray or upper respiratory tract infection.  Plan: #1 Cough with the green expectoration/possible pneumonia versus upper respiratory tract infection: Patient is allergic to vancomycin, penicillin and Levaquin. Degree of allergic reaction is not entirely clear. It does not appear, based on history, that he had any anaphylactic reaction to penicillin. For now, we will place him on doxycycline and cefepime. Follow-up on blood cultures. Consideration was also given to putting him on azithromycin, but doxycycline should cover atypicals. Pulmonary toilet. He is not wheezing currently, so hold off on nebulizer treatments. Continue oxygen.  #2 history of paraplegia: Continue with his home medications  #3. Chronic pain: Continue with fentanyl patch and OxyIR as needed.  #4 Sacral decubitus ulcer: He is followed at the wound care center. The ulcer seems to be healing. No active drainage is noted. He's also has a wound in the right lower leg. This is also healing as per the patient.  #5 abnormal UA: He has previous colonization. He has a chronic suprapubic catheter. He's had Pseudomonas in the past according to cultures from 2012. Urine cultures have been sent. His symptoms and presentation do not support UTI.  #6 Dehydration: Give IV fluids   DVT Prophylaxis: Lovenox Code Status: Full code Family Communication: Discussed with the patient  Disposition Plan: Admit to MedSurg   Further management decisions will depend on results of further testing and patient's response to  treatment.   [facility]  [facility] Pager [phone]  If 7PM-7AM, please contact night-coverage [email or web address] Password [facility]  10/05/2014, 3:27 PM

## 2014-10-05 NOTE — Plan of Care (Signed)
Problem: Phase I Progression Outcomes Goal: Voiding-avoid urinary catheter unless indicated Outcome: Completed/Met Date Met:  10/05/14 Suprapubic cath

## 2014-10-06 ENCOUNTER — Inpatient Hospital Stay (HOSPITAL_COMMUNITY): Payer: Medicare Other

## 2014-10-06 LAB — COMPREHENSIVE METABOLIC PANEL
ALBUMIN: 2.8 g/dL — AB (ref 3.5–5.2)
ALK PHOS: 69 U/L (ref 39–117)
AST: 23 U/L (ref 0–37)
Anion gap: 7 (ref 5–15)
BILIRUBIN TOTAL: 2.5 mg/dL — AB (ref 0.3–1.2)
BUN: 7 mg/dL (ref 6–23)
CHLORIDE: 105 mmol/L (ref 96–112)
CO2: 22 mmol/L (ref 19–32)
Calcium: 8.1 mg/dL — ABNORMAL LOW (ref 8.4–10.5)
Creatinine, Ser: 0.45 mg/dL — ABNORMAL LOW (ref 0.50–1.35)
GFR calc Af Amer: >90 mL/min (ref >90)
GFR calc non Af Amer: >90 mL/min (ref >90)
Glucose, Bld: 140 mg/dL — ABNORMAL HIGH (ref 70–99)
POTASSIUM: 4 mmol/L (ref 3.5–5.1)
SODIUM: 134 mmol/L — AB (ref 135–145)
TOTAL PROTEIN: 6.8 g/dL (ref 6.0–8.3)

## 2014-10-06 LAB — URINE CULTURE

## 2014-10-06 LAB — LACTIC ACID, PLASMA: Lactic Acid, Venous: 3.6 mmol/L (ref 0.5–2.0)

## 2014-10-06 LAB — LEGIONELLA ANTIGEN, URINE

## 2014-10-06 LAB — BRAIN NATRIURETIC PEPTIDE: B NATRIURETIC PEPTIDE 5: 97.5 pg/mL (ref 0.0–100.0)

## 2014-10-06 LAB — CBC
HEMATOCRIT: 34.5 % — AB (ref 39.0–52.0)
Hemoglobin: 11.9 g/dL — ABNORMAL LOW (ref 13.0–17.0)
MCH: 28.1 pg (ref 26.0–34.0)
MCHC: 34.5 g/dL (ref 30.0–36.0)
MCV: 81.4 fL (ref 78.0–100.0)
PLATELETS: 116 10*3/uL — AB (ref 150–400)
RBC: 4.24 MIL/uL (ref 4.22–5.81)
RDW: 14.1 % (ref 11.5–15.5)
WBC: 4.7 10*3/uL (ref 4.0–10.5)

## 2014-10-06 LAB — EXPECTORATED SPUTUM ASSESSMENT W GRAM STAIN, RFLX TO RESP C

## 2014-10-06 LAB — HIV ANTIBODY (ROUTINE TESTING W REFLEX): HIV Screen 4th Generation wRfx: NONREACTIVE

## 2014-10-06 LAB — STREP PNEUMONIAE URINARY ANTIGEN: Strep Pneumo Urinary Antigen: NEGATIVE

## 2014-10-06 LAB — EXPECTORATED SPUTUM ASSESSMENT W REFEX TO RESP CULTURE

## 2014-10-06 MED ORDER — LINEZOLID 600 MG/300ML IV SOLN
600.0000 mg | Freq: Two times a day (BID) | INTRAVENOUS | Status: DC
  Administered 2014-10-06 – 2014-10-10 (×8): 600 mg via INTRAVENOUS
  Filled 2014-10-06 (×8): qty 300

## 2014-10-06 MED ORDER — ENSURE ENLIVE PO LIQD
237.0000 mL | Freq: Two times a day (BID) | ORAL | Status: DC
  Administered 2014-10-06 – 2014-10-10 (×6): 237 mL via ORAL

## 2014-10-06 MED ORDER — IPRATROPIUM-ALBUTEROL 0.5-2.5 (3) MG/3ML IN SOLN
3.0000 mL | Freq: Four times a day (QID) | RESPIRATORY_TRACT | Status: DC
  Administered 2014-10-06 – 2014-10-07 (×3): 3 mL via RESPIRATORY_TRACT
  Filled 2014-10-06 (×3): qty 3

## 2014-10-06 MED ORDER — FUROSEMIDE 10 MG/ML IJ SOLN
20.0000 mg | Freq: Once | INTRAMUSCULAR | Status: AC
  Administered 2014-10-06: 20 mg via INTRAVENOUS
  Filled 2014-10-06 (×2): qty 2

## 2014-10-06 MED ORDER — BENZONATATE 100 MG PO CAPS
100.0000 mg | ORAL_CAPSULE | Freq: Three times a day (TID) | ORAL | Status: DC | PRN
  Administered 2014-10-06 – 2014-10-08 (×2): 100 mg via ORAL
  Filled 2014-10-06 (×2): qty 1

## 2014-10-06 MED ORDER — SODIUM CHLORIDE 0.9 % IV SOLN
INTRAVENOUS | Status: DC
  Administered 2014-10-07: 06:00:00 via INTRAVENOUS

## 2014-10-06 NOTE — Progress Notes (Signed)
CSW attempted to meet with Pt to complete assessment but he was not able to respond verbally to Clinical research associatewriter; Clinical research associatewriter will follow-up with Pt family listed in chart. Pt gave verbal consent to contact family members.   Chad CordialLauren Carter, LCSWA 10/06/2014 5:08 PM [phone]

## 2014-10-06 NOTE — Progress Notes (Addendum)
[facility] PROGRESS NOTE  Alan Warner FAO:[id number] DOB: [birth year] DOA: 10/05/2014 PCP: Eloisa NorthernAMIN, [name], MD  Assessment/Plan: 1. Dyspnea- likely from pneumonia versus pulmonary edema. Patient is currently on cefepime and doxycycline. Will check x-ray of the chest portable, give 1 dose of Lasix 20 mg IV 1. Check BNP and echocardiogram.  2. History of paraplegia- stable 3. Chronic pain-continue fentanyl patch and OxyIR when necessary 4. Sacral decubitus ulcer- no active issues patient follows wound center as outpatient 5. Abnormal UA- patient has a history of previous colonization, urine culture has been sent. Will await urine culture results.  Code Status: Full code Family Communication: No family at bedside Disposition Plan: Home when stable   Consultants:  None  Procedures:  None  Antibiotics:  Cefepime  Doxycycline  HPI/Subjective: 54 y.o. male with a past medical history of paraplegia, chronic pain, sacral decubitus, which has healed, who comes into the hospital with complaints of cough for the last 2-3 days. He had a chest x-ray a few days ago, which did not show any pneumonia. He has been coughing up greenish brownish expectoration. He lives in a skilled nursing facility. He denies any fever, chills. This morning he was placed on oxygen. And he was then set sent over to the hospital for further management. He denies any nausea, vomiting, but has had dry heaves. No abdominal pain. No diarrhea. Denies any nasal drainage. He denies any chest pain. Has had a few episodes of shortness of breath, but none currently. His cough appears to be improved over the course of the last fewhours. He had a low-grade fever in the hospital. He was saturating 90-92 percent on 2 L by nasal cannula.  This morning patient was short of breath using accessory muscles of breathing  Objective: Filed Vitals:   10/06/14 1238  BP: 98/57  Pulse: 83  Temp:   Resp: 19    Intake/Output Summary  (Last 24 hours) at 10/06/14 1423 Last data filed at 10/06/14 0600  Gross per 24 hour  Intake   2360 ml  Output    100 ml  Net   2260 ml   Filed Weights   10/05/14 1428  Weight: 79.379 kg (175 lb)    Exam:   General:  Mild respiratory distress  Cardiovascular: S1-S2 regular  Respiratory: Decreased breath sounds bilaterally  Abdomen: Soft, nontender, no organomegaly  Musculoskeletal: No edema noted in the lower extremities   Data Reviewed: Basic Metabolic Panel:  Recent Labs Lab 10/05/14 0956 10/06/14 0454  NA 135 134*  K 3.5 4.0  CL 106 105  CO2 21 22  GLUCOSE 111* 140*  BUN 5* 7  CREATININE 0.43* 0.45*  CALCIUM 8.2* 8.1*   Liver Function Tests:  Recent Labs Lab 10/06/14 0454  AST 23  ALT <5  ALKPHOS 69  BILITOT 2.5*  PROT 6.8  ALBUMIN 2.8*   No results for input(s): LIPASE, AMYLASE in the last 168 hours. No results for input(s): AMMONIA in the last 168 hours. CBC:  Recent Labs Lab 10/05/14 0956 10/06/14 0454  WBC 4.7 4.7  HGB 12.6* 11.9*  HCT 37.5* 34.5*  MCV 81.2 81.4  PLT 116* 116*   Cardiac Enzymes: No results for input(s): CKTOTAL, CKMB, CKMBINDEX, TROPONINI in the last 168 hours. BNP (last 3 results)  Recent Labs  10/06/14 0454  BNP 97.5    ProBNP (last 3 results) No results for input(s): PROBNP in the last 8760 hours.  CBG: No results for input(s): GLUCAP in the last 168  hours.  Recent Results (from the past 240 hour(s))  Blood culture (routine x 2)     Status: None (Preliminary result)   Collection Time: 10/05/14 10:16 AM  Result Value Ref Range Status   Specimen Description BLOOD LEFT HAND  Final   Special Requests BOTTLES DRAWN AEROBIC AND ANAEROBIC  Final   Culture   Final           BLOOD CULTURE RECEIVED NO GROWTH TO DATE CULTURE WILL BE HELD FOR 5 DAYS BEFORE ISSUING A FINAL NEGATIVE REPORT Performed at [facility]    Report Status PENDING  Incomplete  Blood culture (routine x 2)     Status: None  (Preliminary result)   Collection Time: 10/05/14 10:45 AM  Result Value Ref Range Status   Specimen Description BLOOD RIGHT ANTECUBITAL  Final   Special Requests BOTTLES DRAWN AEROBIC AND ANAEROBIC  Final   Culture   Final           BLOOD CULTURE RECEIVED NO GROWTH TO DATE CULTURE WILL BE HELD FOR 5 DAYS BEFORE ISSUING A FINAL NEGATIVE REPORT Performed at [facility]    Report Status PENDING  Incomplete     Studies: Dg Chest 2 View (if Patient Has Fever And/or Copd)  10/05/2014   CLINICAL DATA:  Shortness of breath, productive cough  EXAM: CHEST  2 VIEW  COMPARISON:  06/26/2012  FINDINGS: Cardiomediastinal silhouette is unremarkable. Mild thoracic dextroscoliosis. No acute infiltrate or pleural effusion. No pulmonary edema.  IMPRESSION: No active cardiopulmonary disease.   Electronically Signed   By: Natasha Mead M.D.   On: 10/05/2014 11:32   Dg Chest Port 1 View  10/06/2014   CLINICAL DATA:  Dyspnea.  Cough last 3-4 days.  Smoker.  EXAM: PORTABLE CHEST - 1 VIEW  COMPARISON:  10/05/2014 and 06/26/2012  FINDINGS: Lungs are adequately inflated and demonstrate hazy opacification of the right base. No definite effusion. Minimal prominence of the perihilar markings are present. Cardiomediastinal silhouette is within normal. Moderate stable curvature of the thoracic spine convex right. Hardware over the lower cervical spine.  IMPRESSION: Hazy airspace density over the right base likely a pneumonia.   Electronically Signed   By: Elberta Fortis M.D.   On: 10/06/2014 12:00    Scheduled Meds: . baclofen  10 mg Oral TID  . ceFEPime (MAXIPIME) IV  1 g Intravenous Q8H  . doxycycline (VIBRAMYCIN) IV  100 mg Intravenous Q12H  . enoxaparin (LOVENOX) injection  40 mg Subcutaneous Q24H  . fentaNYL  50 mcg Transdermal Q72H  . fentaNYL  75 mcg Transdermal Q72H  . ipratropium-albuterol  3 mL Nebulization Q6H  . Linaclotide  145 mcg Oral Q breakfast  . loratadine  10 mg Oral Daily  . oxybutynin  5  mg Oral QID  . potassium chloride  10 mEq Oral BID  . pregabalin  100 mg Oral BID  . rOPINIRole  0.5 mg Oral QHS   Continuous Infusions:   Principal Problem:   HCAP (healthcare-associated pneumonia) Active Problems:   Cough   Paraplegia   Chronic pain   Sacral decubitus ulcer    Time spent: 25 min    [facility]  [facility] Pager [phone]. If 7PM-7AM, please contact night-coverage at [email or web address], password [facility] 10/06/2014, 2:23 PM  LOS: 1 day

## 2014-10-06 NOTE — Progress Notes (Signed)
INITIAL NUTRITION ASSESSMENT   DOCUMENTATION CODES Per approved criteria  -Not Applicable   INTERVENTION: - Continue current diet - Will order Ensure Enlive po BID, each supplement provides 350 kcal and 20 grams of protein  NUTRITION DIAGNOSIS: Increased protein needs related to biological demands for wound healing as evidenced by stage 3 sacral ulcer.   Goal: Pt to meet >/= 90% estimated nutrition needs  Monitor:  Meal and supplement intakes, weight trends, labs  Reason for Assessment: Low Braden  54 y.o. male  Admitting Dx: HCAP (healthcare-associated pneumonia)  ASSESSMENT: Pt is a 54 year old with hx of paraplegia, chronic pain, chronic sacral decubitus ulcer. He is from a SNF and was noted to be slightly dehydrated on admission.  Pt seen for low Braden. BMI indicates normal weight status. Pt reports he did not eat anything for breakfast or lunch today. He states that usually he has a very good appetite and will eat all day long. He states that since 2 days PTA he has had no appetite and has not been eating/drinking during that time.  He states UBW of 175 lbs and feels that he has lost weight recently; current weight consistent with UBW. Physical assessment shows no signs of muscle or fat wasting. He denies drinking nutrition supplements at facility as he has a very good appetite, but would like Ensure ordered here. He denies any chewing/swallowing issues.  Not currently meeting needs. Labs reviewed; Na: 134 mmol/L, creatinine low.  Height: Ht Readings from Last 1 Encounters:  10/05/14 6\' 2"  (1.88 m)    Weight: Wt Readings from Last 1 Encounters:  10/05/14 175 lb (79.379 kg)    Ideal Body Weight: 169 lbs (76.82 kg)  % Ideal Body Weight: 103%  Wt Readings from Last 10 Encounters:  10/05/14 175 lb (79.379 kg)  05/23/14 175 lb (79.379 kg)    Usual Body Weight: 175 lbs (79.38 kg)  % Usual Body Weight: 100%  BMI:  Body mass index is 22.46  kg/(m^2).  Estimated Nutritional Needs: Kcal: 1700 kcal-1900 Protein: 80-100 grams Fluid: 2-2.5L/day  Skin:  - Stage 3 sacral ulcer - Unstageable ulcers to heel and ankle (side not documented)  Diet Order: Diet regular Room service appropriate?: Yes; Fluid consistency:: Thin  EDUCATION NEEDS: -No education needs identified at this time   Intake/Output Summary (Last 24 hours) at 10/06/14 1441 Last data filed at 10/06/14 0600  Gross per 24 hour  Intake   2360 ml  Output    100 ml  Net   2260 ml    Last BM: 4/21   Labs:   Recent Labs Lab 10/05/14 0956 10/06/14 0454  NA 135 134*  K 3.5 4.0  CL 106 105  CO2 21 22  BUN 5* 7  CREATININE 0.43* 0.45*  CALCIUM 8.2* 8.1*  GLUCOSE 111* 140*    CBG (last 3)  No results for input(s): GLUCAP in the last 72 hours.  Scheduled Meds: . baclofen  10 mg Oral TID  . ceFEPime (MAXIPIME) IV  1 g Intravenous Q8H  . doxycycline (VIBRAMYCIN) IV  100 mg Intravenous Q12H  . enoxaparin (LOVENOX) injection  40 mg Subcutaneous Q24H  . fentaNYL  50 mcg Transdermal Q72H  . fentaNYL  75 mcg Transdermal Q72H  . ipratropium-albuterol  3 mL Nebulization Q6H  . Linaclotide  145 mcg Oral Q breakfast  . loratadine  10 mg Oral Daily  . oxybutynin  5 mg Oral QID  . potassium chloride  10 mEq Oral BID  .  pregabalin  100 mg Oral BID  . rOPINIRole  0.5 mg Oral QHS    Continuous Infusions:   Past Medical History  Diagnosis Date  . Immobile, syndrome paraplegic   . 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 Hip arthrotomy Right   . Small intestine surgery      Trenton Gammon, RD, LDN Inpatient Clinical Dietitian Pager # [phone] After hours/weekend pager # [phone]

## 2014-10-06 NOTE — Progress Notes (Signed)
CRITICAL VALUE ALERT  Critical value received:  Lactic Acid 3.6  Date of notification:  10/06/14  Time of notification:  2216  Critical value read back:Yes.    Nurse who received alert:  Eugene GarnetSarah M Jung Yurchak RN, BSN  MD notified (1st page):  Elray McgregorMary Lynch NP  Time of first page:  2216  Responding MD:  Elray McgregorMary Lynch NP  Time MD responded:  2218   Orders received

## 2014-10-06 NOTE — Plan of Care (Signed)
Problem: Phase I Progression Outcomes Goal: OOB as tolerated unless otherwise ordered Outcome: Adequate for Discharge Pt with hx of paraplegia below the waist

## 2014-10-06 NOTE — Progress Notes (Signed)
Pt HR tachy 100-115, respirations elevated. Pt coughing up copious amounts of brown thick sputum, now with small amount of blood. Pt anxious. Elray McgregorMary Lynch NP notified, orders received.

## 2014-10-06 NOTE — Plan of Care (Signed)
Problem: Phase I Progression Outcomes Goal: Hemodynamically stable Outcome: Progressing Fluids restarted for signs of sepsis

## 2014-10-07 DIAGNOSIS — R042 Hemoptysis: Secondary | ICD-10-CM

## 2014-10-07 DIAGNOSIS — R0600 Dyspnea, unspecified: Secondary | ICD-10-CM

## 2014-10-07 LAB — CBC
HCT: 32.7 % — ABNORMAL LOW (ref 39.0–52.0)
Hemoglobin: 10.8 g/dL — ABNORMAL LOW (ref 13.0–17.0)
MCH: 27 pg (ref 26.0–34.0)
MCHC: 33 g/dL (ref 30.0–36.0)
MCV: 81.8 fL (ref 78.0–100.0)
Platelets: 122 10*3/uL — ABNORMAL LOW (ref 150–400)
RBC: 4 MIL/uL — ABNORMAL LOW (ref 4.22–5.81)
RDW: 14.2 % (ref 11.5–15.5)
WBC: 6 10*3/uL (ref 4.0–10.5)

## 2014-10-07 LAB — COMPREHENSIVE METABOLIC PANEL
ALK PHOS: 68 U/L (ref 39–117)
ALT: 8 U/L (ref 0–53)
ANION GAP: 7 (ref 5–15)
AST: 11 U/L (ref 0–37)
Albumin: 2.7 g/dL — ABNORMAL LOW (ref 3.5–5.2)
BILIRUBIN TOTAL: 1.7 mg/dL — AB (ref 0.3–1.2)
BUN: 8 mg/dL (ref 6–23)
CHLORIDE: 104 mmol/L (ref 96–112)
CO2: 25 mmol/L (ref 19–32)
CREATININE: 0.58 mg/dL (ref 0.50–1.35)
Calcium: 8 mg/dL — ABNORMAL LOW (ref 8.4–10.5)
GFR calc Af Amer: >90 mL/min (ref >90)
GLUCOSE: 140 mg/dL — AB (ref 70–99)
Potassium: 3.2 mmol/L — ABNORMAL LOW (ref 3.5–5.1)
Sodium: 136 mmol/L (ref 135–145)
Total Protein: 6.7 g/dL (ref 6.0–8.3)

## 2014-10-07 LAB — LACTIC ACID, PLASMA: Lactic Acid, Venous: 1.2 mmol/L (ref 0.5–2.0)

## 2014-10-07 LAB — MRSA PCR SCREENING: MRSA BY PCR: POSITIVE — AB

## 2014-10-07 MED ORDER — PREGABALIN 50 MG PO CAPS
100.0000 mg | ORAL_CAPSULE | Freq: Two times a day (BID) | ORAL | Status: DC
  Administered 2014-10-07 – 2014-10-10 (×6): 100 mg via ORAL
  Filled 2014-10-07 (×3): qty 2
  Filled 2014-10-07 (×2): qty 1
  Filled 2014-10-07: qty 2

## 2014-10-07 MED ORDER — POTASSIUM CHLORIDE CRYS ER 20 MEQ PO TBCR
40.0000 meq | EXTENDED_RELEASE_TABLET | Freq: Two times a day (BID) | ORAL | Status: AC
  Administered 2014-10-07: 40 meq via ORAL
  Filled 2014-10-07: qty 2

## 2014-10-07 MED ORDER — OXYBUTYNIN CHLORIDE 5 MG PO TABS
5.0000 mg | ORAL_TABLET | Freq: Four times a day (QID) | ORAL | Status: DC | PRN
  Administered 2014-10-07 – 2014-10-10 (×4): 5 mg via ORAL
  Filled 2014-10-07 (×5): qty 1

## 2014-10-07 MED ORDER — CHLORHEXIDINE GLUCONATE CLOTH 2 % EX PADS
6.0000 | MEDICATED_PAD | Freq: Every day | CUTANEOUS | Status: DC
  Administered 2014-10-08 – 2014-10-09 (×2): 6 via TOPICAL

## 2014-10-07 MED ORDER — OXYBUTYNIN CHLORIDE 5 MG PO TABS
5.0000 mg | ORAL_TABLET | ORAL | Status: DC
  Administered 2014-10-07: 5 mg via ORAL

## 2014-10-07 MED ORDER — IPRATROPIUM-ALBUTEROL 0.5-2.5 (3) MG/3ML IN SOLN
3.0000 mL | RESPIRATORY_TRACT | Status: DC
  Administered 2014-10-07: 3 mL via RESPIRATORY_TRACT
  Filled 2014-10-07: qty 3

## 2014-10-07 MED ORDER — POTASSIUM CHLORIDE 10 MEQ/100ML IV SOLN
10.0000 meq | INTRAVENOUS | Status: DC
  Administered 2014-10-07: 10 meq via INTRAVENOUS
  Filled 2014-10-07: qty 100

## 2014-10-07 MED ORDER — IPRATROPIUM-ALBUTEROL 0.5-2.5 (3) MG/3ML IN SOLN
3.0000 mL | Freq: Four times a day (QID) | RESPIRATORY_TRACT | Status: DC
  Administered 2014-10-07 – 2014-10-09 (×9): 3 mL via RESPIRATORY_TRACT
  Filled 2014-10-07 (×11): qty 3

## 2014-10-07 MED ORDER — POTASSIUM CHLORIDE IN NACL 20-0.9 MEQ/L-% IV SOLN
INTRAVENOUS | Status: DC
  Administered 2014-10-07: 100 mL/h via INTRAVENOUS
  Administered 2014-10-08: 05:00:00 via INTRAVENOUS
  Filled 2014-10-07 (×2): qty 1000

## 2014-10-07 MED ORDER — BACLOFEN 10 MG PO TABS
10.0000 mg | ORAL_TABLET | Freq: Four times a day (QID) | ORAL | Status: DC
  Administered 2014-10-07 – 2014-10-10 (×9): 10 mg via ORAL
  Filled 2014-10-07 (×12): qty 1

## 2014-10-07 MED ORDER — MUPIROCIN 2 % EX OINT
TOPICAL_OINTMENT | CUTANEOUS | Status: AC
  Filled 2014-10-07: qty 22

## 2014-10-07 MED ORDER — MUPIROCIN 2 % EX OINT
1.0000 "application " | TOPICAL_OINTMENT | Freq: Two times a day (BID) | CUTANEOUS | Status: DC
  Administered 2014-10-07 – 2014-10-10 (×7): 1 via NASAL
  Filled 2014-10-07 (×2): qty 22

## 2014-10-07 NOTE — Consult Note (Signed)
PULMONARY/CCM CONSULT NOTE  Requesting MD/Service: Lama/[facility] Date of admission: 4/21 Date of consult: 4/23 Reason for consultation:  hemoptysis  Pt Profile:  [age] M paraplegic [facility], purulent sputum, minimal dyspnea. Transferred to ICU/SDU 4/23 with increasing dyspnea, hemoptysis   MAJOR EVENTS/TEST RESULTS:   INDWELLING DEVICES::   MICRO DATA: Urine 4/21 >> multiple organisms Resp 4/21 >>  Blood 4/21 >>    ANTIMICROBIALS:  Cefepime 4/21 >>  Linezolid 4/23 >>   HPI:  I have reviewed pt's initial presentation, consultants notes and hospital database in detail.  The above assessment and plan was formulated under my direction. Presently, pt denies dyspnea, chest pain. His cough has improved. He has had no N/V/D or dysuria. His abx were expanded this AM by Dr Sharl MaLama   Past Medical History  Diagnosis Date  . Immobile, syndrome paraplegic   . 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 Marital Status: Single    Spouse Name: N/A  . Number of Children: N/A  . Years of Education: N/A   Occupational History  . Not on file.   Social History Main Topics  . Smoking status: Current Every Day Smoker -- 0.50 packs/day    Types: Cigarettes  . Smokeless tobacco: Not on file  . Alcohol Use: No  . Drug Use: Yes    Special: Marijuana  . Sexual Activity: Not on file   Other Topics Concern  . Not on file   Social History Narrative    Family History  Problem Relation Age of Onset  . Sinusitis Sister     ROS - Per HPI. Otherwise negative  Filed Vitals:   10/07/14 0811 10/07/14 0915 10/07/14 1130 10/07/14 1200  BP: 95/53 104/61    Pulse: 95 103 92   Temp: 97.3 F (36.3 C) 98.1 F (36.7 C)  98.4 F (36.9 C)  TempSrc: Axillary Oral  Oral  Resp: 20 38 21   Height:  6\' 2"  (1.88 m)    Weight:  75.7 kg (166 lb 14.2 oz)    SpO2: 96% 97% 96%     EXAM:  Gen: NAD, mildly anxious HEENT: NCAT, WNL Neck: no  JVD Lungs: slightly coarse BS throughout without wheezing or consolidation Cardiovascular: reg, no M Abdomen: mildly distended, soft, NT, diminished BS Ext: LE atrophy, no edema Neuro: CNs intact, R hand contractures, paraplegia  DATA:  CBC    Component Value Date/Time   WBC 6.0 10/07/2014 0035   RBC 4.00* 10/07/2014 0035   HGB 10.8* 10/07/2014 0035   HCT 32.7* 10/07/2014 0035   PLT 122* 10/07/2014 0035   MCV 81.8 10/07/2014 0035   MCH 27.0 10/07/2014 0035   MCHC 33.0 10/07/2014 0035   RDW 14.2 10/07/2014 0035   LYMPHSABS 0.9 05/23/2014 2308   MONOABS 0.4 05/23/2014 2308   EOSABS 0.1 05/23/2014 2308   BASOSABS 0.0 05/23/2014 2308    BMET    Component Value Date/Time   NA 136 10/07/2014 0035   K 3.2* 10/07/2014 0035   CL 104 10/07/2014 0035   CO2 25 10/07/2014 0035   GLUCOSE 140* 10/07/2014 0035   BUN 8 10/07/2014 0035   CREATININE 0.58 10/07/2014 0035   CALCIUM 8.0* 10/07/2014 0035   GFRNONAA >90 10/07/2014 0035   GFRAA >90 10/07/2014 0035      CXR (4/22): vague RLL opacity  IMPRESSION:   Paraplegia RLL PNA Hemoptysis - likely due to PNA and acute airway inflammation  Resp distress - improved  PLAN:  Agree with monitoring in SDU No indication for FOB at present Agree with current abx  Narrow as able Repeat CXR AM 4/24   Billy Fischer, MD ; [facility] service Mobile [phone].  After 5:30 PM or weekends, call [phone]

## 2014-10-07 NOTE — Progress Notes (Signed)
Dr Sharl MaLama called concerning patient with labored breathing and copious amounts of bloody sputum. This is a great change from his normal baseline, patient very distressed. Will continue to monitor, awaiting call back from MD.

## 2014-10-07 NOTE — Progress Notes (Signed)
[facility] PROGRESS NOTE  Alan Warner ZOX:[id number] DOB: [birth year] DOA: 10/05/2014 PCP: Eloisa Northern, MD  Assessment/Plan: 1. Pneumonia - patient continues to have hemoptysis with brown thick sputum. Also has tachypnea. We'll transfer the patient to stepdown unit of called and discussed with our neurologist on call Dr. Sung Amabile, we will see the patient in the stepdown unit.  2. History of paraplegia- stable 3. Hypokalemia- will replace potassium and check BMP in a.m. 4. Chronic pain-continue fentanyl patch and OxyIR when necessary 5. Sacral decubitus ulcer- no active issues patient follows wound center as outpatient 6. Abnormal UA- patient has a history of previous colonization, urine culture has been sent. Urine culture growing contaminants.  Code Status: Full code Family Communication: No family at bedside Disposition Plan: Home when stable   Consultants:  None  Procedures:  None  Antibiotics:  Cefepime  Doxycycline stopped for 10/06/14  Zyvox 10/06/14   HPI/Subjective: 54 y.o. male with a past medical history of paraplegia, chronic pain, sacral decubitus, which has healed, who comes into the hospital with complaints of cough for the last 2-3 days. He had a chest x-ray a few days ago, which did not show any pneumonia. He has been coughing up greenish brownish expectoration. He lives in a skilled nursing facility. He denies any fever, chills. This morning he was placed on oxygen. And he was then set sent over to the hospital for further management. He denies any nausea, vomiting, but has had dry heaves. No abdominal pain. No diarrhea. Denies any nasal drainage. He denies any chest pain. Has had a few episodes of shortness of breath, but none currently. His cough appears to be improved over the course of the last fewhours. He had a low-grade fever in the hospital. He was saturating 90-92 percent on 2 L by nasal cannula.  Patient seen and examined, continues to be tachypneic  with coughing up brown thick sputum. Yesterday the antibiotics were changed from doxycycline to Zyvox after discussion with infectious disease.  Objective: Filed Vitals:   10/07/14 0915  BP: 104/61  Pulse: 103  Temp: 98.1 F (36.7 C)  Resp: 38    Intake/Output Summary (Last 24 hours) at 10/07/14 1117 Last data filed at 10/07/14 1100  Gross per 24 hour  Intake   1485 ml  Output    750 ml  Net    735 ml   Filed Weights   10/05/14 1428  Weight: 79.379 kg (175 lb)    Exam:   General:  Mild respiratory distress  Cardiovascular: S1-S2 regular  Respiratory: Decreased breath sounds bilaterally  Abdomen: Soft, nontender, no organomegaly  Musculoskeletal: No edema noted in the lower extremities   Data Reviewed: Basic Metabolic Panel:  Recent Labs Lab 10/05/14 0956 10/06/14 0454 10/07/14 0035  NA 135 134* 136  K 3.5 4.0 3.2*  CL 106 105 104  CO2 GLUCOSE 111* 140* 140*  BUN 5* 7 8  CREATININE 0.43* 0.45* 0.58  CALCIUM 8.2* 8.1* 8.0*   Liver Function Tests:  Recent Labs Lab 10/06/14 0454 10/07/14 0035  AST 23 11  ALT <5 8  ALKPHOS 69 68  BILITOT 2.5* 1.7*  PROT 6.8 6.7  ALBUMIN 2.8* 2.7*   No results for input(s): LIPASE, AMYLASE in the last 168 hours. No results for input(s): AMMONIA in the last 168 hours. CBC:  Recent Labs Lab 10/05/14 0956 10/06/14 0454 10/07/14 0035  WBC 4.7 4.7 6.0  HGB 12.6* 11.9* 10.8*  HCT 37.5* 34.5* 32.7*  MCV 81.2 81.4 81.8  PLT 116* 116* 122*   Cardiac Enzymes: No results for input(s): CKTOTAL, CKMB, CKMBINDEX, TROPONINI in the last 168 hours. BNP (last 3 results)  Recent Labs  10/06/14 0454  BNP 97.5    ProBNP (last 3 results) No results for input(s): PROBNP in the last 8760 hours.  CBG: No results for input(s): GLUCAP in the last 168 hours.  Recent Results (from the past 240 hour(s))  Blood culture (routine x 2)     Status: None (Preliminary result)   Collection Time: 10/05/14 10:16 AM   Result Value Ref Range Status   Specimen Description BLOOD LEFT HAND  Final   Special Requests BOTTLES DRAWN AEROBIC AND ANAEROBIC  Final   Culture   Final           BLOOD CULTURE RECEIVED NO GROWTH TO DATE CULTURE WILL BE HELD FOR 5 DAYS BEFORE ISSUING A FINAL NEGATIVE REPORT Performed at [facility]    Report Status PENDING  Incomplete  Blood culture (routine x 2)     Status: None (Preliminary result)   Collection Time: 10/05/14 10:45 AM  Result Value Ref Range Status   Specimen Description BLOOD RIGHT ANTECUBITAL  Final   Special Requests BOTTLES DRAWN AEROBIC AND ANAEROBIC  Final   Culture   Final           BLOOD CULTURE RECEIVED NO GROWTH TO DATE CULTURE WILL BE HELD FOR 5 DAYS BEFORE ISSUING A FINAL NEGATIVE REPORT Performed at [facility]    Report Status PENDING  Incomplete  Urine culture     Status: None   Collection Time: 10/05/14 12:51 PM  Result Value Ref Range Status   Specimen Description URINE, CATHETERIZED  Final   Special Requests NONE  Final   Colony Count   Final    >=100,000 COLONIES/ML Performed at [facility]    Culture   Final    Multiple bacterial morphotypes present, none predominant. Suggest appropriate recollection if clinically indicated. Performed at [facility]    Report Status 10/06/2014 FINAL  Final  Culture, sputum-assessment     Status: None   Collection Time: 10/06/14  8:58 PM  Result Value Ref Range Status   Specimen Description SPUTUM  Final   Special Requests NONE  Final   Sputum evaluation   Final    THIS SPECIMEN IS ACCEPTABLE. RESPIRATORY CULTURE REPORT TO FOLLOW.   Report Status 10/06/2014 FINAL  Final     Studies: Dg Chest Port 1 View  10/06/2014   CLINICAL DATA:  Dyspnea.  Cough last 3-4 days.  Smoker.  EXAM: PORTABLE CHEST - 1 VIEW  COMPARISON:  10/05/2014 and 06/26/2012  FINDINGS: Lungs are adequately inflated and demonstrate hazy opacification of the right base. No definite  effusion. Minimal prominence of the perihilar markings are present. Cardiomediastinal silhouette is within normal. Moderate stable curvature of the thoracic spine convex right. Hardware over the lower cervical spine.  IMPRESSION: Hazy airspace density over the right base likely a pneumonia.   Electronically Signed   By: Elberta Fortis M.D.   On: 10/06/2014 12:00    Scheduled Meds: . baclofen  10 mg Oral TID  . ceFEPime (MAXIPIME) IV  1 g Intravenous Q8H  . enoxaparin (LOVENOX) injection  40 mg Subcutaneous Q24H  . feeding supplement (ENSURE ENLIVE)  237 mL Oral BID BM  . fentaNYL  50 mcg Transdermal Q72H  . fentaNYL  75 mcg Transdermal Q72H  . ipratropium-albuterol  3 mL Nebulization Q6H  . Linaclotide  145 mcg Oral Q breakfast  . linezolid  600 mg Intravenous Q12H  . loratadine  10 mg Oral Daily  . oxybutynin  5 mg Oral QID  . potassium chloride  10 mEq Oral BID  . pregabalin  100 mg Oral BID  . rOPINIRole  0.5 mg Oral QHS   Continuous Infusions: . sodium chloride 100 mL/hr at 10/07/14 [id number]    Principal Problem:   HCAP (healthcare-associated pneumonia) Active Problems:   Cough   Paraplegia   Chronic pain   Sacral decubitus ulcer    Time spent: 25 min    [facility] Center[facility],Madgie Dhaliwal S  [facility] Pager [phone]. If 7PM-7AM, please contact night-coverage at [email or web address], password [email or web address] 10/07/2014, 11:17 AM  LOS: 2 days

## 2014-10-07 NOTE — Progress Notes (Signed)
Pt stated that he finished eating recently and does not want cpt at this time.  Also, that he is going to bed after he gets his bedtime meds and does not want to be woken up for cpt or breathing treatment tonight.

## 2014-10-07 NOTE — Progress Notes (Signed)
  Echocardiogram 2D Echocardiogram has been performed.  Alan Warner, Alan Warner A 10/07/2014, 12:40 PM

## 2014-10-08 ENCOUNTER — Inpatient Hospital Stay (HOSPITAL_COMMUNITY): Payer: Medicare Other

## 2014-10-08 DIAGNOSIS — R042 Hemoptysis: Secondary | ICD-10-CM | POA: Insufficient documentation

## 2014-10-08 DIAGNOSIS — J1520 Pneumonia due to staphylococcus, unspecified: Secondary | ICD-10-CM

## 2014-10-08 LAB — CBC
HCT: 30 % — ABNORMAL LOW (ref 39.0–52.0)
HEMOGLOBIN: 10.1 g/dL — AB (ref 13.0–17.0)
MCH: 27.4 pg (ref 26.0–34.0)
MCHC: 33.7 g/dL (ref 30.0–36.0)
MCV: 81.3 fL (ref 78.0–100.0)
PLATELETS: 136 10*3/uL — AB (ref 150–400)
RBC: 3.69 MIL/uL — AB (ref 4.22–5.81)
RDW: 14.2 % (ref 11.5–15.5)
WBC: 5.1 10*3/uL (ref 4.0–10.5)

## 2014-10-08 LAB — COMPREHENSIVE METABOLIC PANEL
ALK PHOS: 61 U/L (ref 39–117)
ALT: 9 U/L (ref 0–53)
ANION GAP: 8 (ref 5–15)
AST: 24 U/L (ref 0–37)
Albumin: 2.5 g/dL — ABNORMAL LOW (ref 3.5–5.2)
BUN: 5 mg/dL — AB (ref 6–23)
CALCIUM: 8.3 mg/dL — AB (ref 8.4–10.5)
CO2: 22 mmol/L (ref 19–32)
Chloride: 108 mmol/L (ref 96–112)
Creatinine, Ser: 0.44 mg/dL — ABNORMAL LOW (ref 0.50–1.35)
GFR calc Af Amer: >90 mL/min (ref >90)
GFR calc non Af Amer: >90 mL/min (ref >90)
GLUCOSE: 109 mg/dL — AB (ref 70–99)
Potassium: 4.1 mmol/L (ref 3.5–5.1)
Sodium: 138 mmol/L (ref 135–145)
Total Bilirubin: 1.6 mg/dL — ABNORMAL HIGH (ref 0.3–1.2)
Total Protein: 6.3 g/dL (ref 6.0–8.3)

## 2014-10-08 LAB — PROCALCITONIN: Procalcitonin: 0.38 ng/mL

## 2014-10-08 MED ORDER — SODIUM CHLORIDE 0.9 % IV SOLN
INTRAVENOUS | Status: DC
  Administered 2014-10-08: 17:00:00 via INTRAVENOUS

## 2014-10-08 MED ORDER — CETYLPYRIDINIUM CHLORIDE 0.05 % MT LIQD
7.0000 mL | Freq: Two times a day (BID) | OROMUCOSAL | Status: DC
  Administered 2014-10-09 – 2014-10-10 (×3): 7 mL via OROMUCOSAL

## 2014-10-08 NOTE — Progress Notes (Signed)
10/08/14 1450 Patient coughing up bloody mucous.
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
## 2014-10-08 NOTE — Progress Notes (Signed)
[facility] PROGRESS NOTE  Glenice LaineJerry W Kersting WJX:[id number] DOB: 05/21/1961 DOA: 10/05/2014 PCP: Eloisa NorthernAMIN, [name], MD  Assessment/Plan: 1. Pneumonia - Improved, cxr this morning shows persistent right  hazy opacity. Continue zyvox and cefepime. Pulmonary following. 2. History of paraplegia- stable 3. Hypokalemia-  Replete, potassium is 4.1 today 4. Chronic pain-continue fentanyl patch and oxyIR when necessary 5. Sacral decubitus ulcer- no active issues patient follows wound center as outpatient 6. Abnormal UA- patient has a history of previous colonization, urine culture has been sent. Urine culture growing contaminants.  Code Status: Full code Family Communication: No family at bedside Disposition Plan: Home when stable   Consultants:  None  Procedures:  None  Antibiotics:  Cefepime 10/05/14- present  Doxycycline stopped for 10/06/14  Zyvox  Started on 10/06/14   HPI/Subjective: 54 y.o. male with a past medical history of paraplegia, chronic pain, sacral decubitus, which has healed, who comes into the hospital with complaints of cough for the last 2-3 days. He had a chest x-ray a few days ago, which did not show any pneumonia. He has been coughing up greenish brownish expectoration. He lives in a skilled nursing facility. He denies any fever, chills. This morning he was placed on oxygen. And he was then set sent over to the hospital for further management. He denies any nausea, vomiting, but has had dry heaves. No abdominal pain. No diarrhea. Denies any nasal drainage. He denies any chest pain. Has had a few episodes of shortness of breath, but none currently. His cough appears to be improved over the course of the last fewhours. He had a low-grade fever in the hospital. He was saturating 90-92 percent on 2 L by nasal cannula.  Patient seen and examined, breathing has improved and hemoptysis is better. Was transferred to step down yesterday, and was seen by  pulmonology.  Objective: Filed Vitals:   10/08/14 0800  BP:   Pulse:   Temp: 98.4 F (36.9 C)  Resp: 28    Intake/Output Summary (Last 24 hours) at 10/08/14 1100 Last data filed at 10/08/14 [id number]  Gross per 24 hour  Intake   2660 ml  Output   1450 ml  Net   1210 ml   Filed Weights   10/05/14 1428 10/07/14 0915  Weight: 79.379 kg (175 lb) 75.7 kg (166 lb 14.2 oz)    Exam:   General:  Mild respiratory distress  Cardiovascular: S1-S2 regular  Respiratory: Decreased breath sounds bilaterally  Abdomen: Soft, nontender, no organomegaly  Musculoskeletal: No edema noted in the lower extremities   Data Reviewed: Basic Metabolic Panel:  Recent Labs Lab 10/05/14 0956 10/06/14 0454 10/07/14 0035 10/08/14 0713  NA 135 134* 136 138  K 3.5 4.0 3.2* 4.1  CL 106 105 104 108  CO2 21 22 25 22   GLUCOSE 111* 140* 140* 109*  BUN 5* 7 8 5*  CREATININE 0.43* 0.45* 0.58 0.44*  CALCIUM 8.2* 8.1* 8.0* 8.3*   Liver Function Tests:  Recent Labs Lab 10/06/14 0454 10/07/14 0035 10/08/14 0713  AST 23 11 24   ALT 5 8 9   ALKPHOS 69 68 61  BILITOT 2.5* 1.7* 1.6*  PROT 6.8 6.7 6.3  ALBUMIN 2.8* 2.7* 2.5*   No results for input(s): LIPASE, AMYLASE in the last 168 hours. No results for input(s): AMMONIA in the last 168 hours. CBC:  Recent Labs Lab 10/05/14 0956 10/06/14 0454 10/07/14 0035 10/08/14 0713  WBC 4.7 4.7 6.0 5.1  HGB 12.6* 11.9* 10.8* 10.1*  HCT 37.5* 34.5*  32.7* 30.0*  MCV 81.2 81.4 81.8 81.3  PLT 116* 116* 122* 136*   Cardiac Enzymes: No results for input(s): CKTOTAL, CKMB, CKMBINDEX, TROPONINI in the last 168 hours. BNP (last 3 results)  Recent Labs  10/06/14 0454  BNP 97.5    ProBNP (last 3 results) No results for input(s): PROBNP in the last 8760 hours.  CBG: No results for input(s): GLUCAP in the last 168 hours.  Recent Results (from the past 240 hour(s))  Blood culture (routine x 2)     Status: None (Preliminary result)   Collection  Time: 10/05/14 10:16 AM  Result Value Ref Range Status   Specimen Description BLOOD LEFT HAND  Final   Special Requests BOTTLES DRAWN AEROBIC AND ANAEROBIC  Final   Culture   Final           BLOOD CULTURE RECEIVED NO GROWTH TO DATE CULTURE WILL BE HELD FOR 5 DAYS BEFORE ISSUING A FINAL NEGATIVE REPORT Performed at [facility]    Report Status PENDING  Incomplete  Blood culture (routine x 2)     Status: None (Preliminary result)   Collection Time: 10/05/14 10:45 AM  Result Value Ref Range Status   Specimen Description BLOOD RIGHT ANTECUBITAL  Final   Special Requests BOTTLES DRAWN AEROBIC AND ANAEROBIC  Final   Culture   Final           BLOOD CULTURE RECEIVED NO GROWTH TO DATE CULTURE WILL BE HELD FOR 5 DAYS BEFORE ISSUING A FINAL NEGATIVE REPORT Performed at [facility]    Report Status PENDING  Incomplete  Urine culture     Status: None   Collection Time: 10/05/14 12:51 PM  Result Value Ref Range Status   Specimen Description URINE, CATHETERIZED  Final   Special Requests NONE  Final   Colony Count   Final    >=100,000 COLONIES/ML Performed at [facility]    Culture   Final    Multiple bacterial morphotypes present, none predominant. Suggest appropriate recollection if clinically indicated. Performed at [facility]    Report Status 10/06/2014 FINAL  Final  Culture, sputum-assessment     Status: None   Collection Time: 10/06/14  8:58 PM  Result Value Ref Range Status   Specimen Description SPUTUM  Final   Special Requests NONE  Final   Sputum evaluation   Final    THIS SPECIMEN IS ACCEPTABLE. RESPIRATORY CULTURE REPORT TO FOLLOW.   Report Status 10/06/2014 FINAL  Final  MRSA PCR Screening     Status: Abnormal   Collection Time: 10/07/14  9:10 AM  Result Value Ref Range Status   MRSA by PCR POSITIVE (A) NEGATIVE Final    Comment:        The GeneXpert MRSA Assay (FDA approved for NASAL specimens only), is one component of  a comprehensive MRSA colonization surveillance program. It is not intended to diagnose MRSA infection nor to guide or monitor treatment for MRSA infections. RESULT CALLED TO, READ BACK BY AND VERIFIED WITH: S DILLON AT 1358 ON 04.23.2016 BY NBROOKS      Studies: Dg Chest Port 1 View  10/08/2014   CLINICAL DATA:  Shortness of breath, history of pneumonia  EXAM: PORTABLE CHEST - 1 VIEW  COMPARISON:  10/06/2014  FINDINGS: Cervical fusion hardware partly visualized. Severe rightward curvature of the thoracic spine reidentified centered at T6. Hazy right basilar airspace opacity is reidentified without significant interval change. Bilateral trace pleural fluid or thickening. Mild  enlargement of the cardiac silhouette reidentified without evidence for edema.  IMPRESSION: Stable patchy hazy right basilar airspace opacity. This could represent pneumonia although other alveolar filling processes could appear similar.   Electronically Signed   By: Christiana Pellant M.D.   On: 10/08/2014 10:36    Scheduled Meds: . antiseptic oral rinse  7 mL Mouth Rinse BID  . baclofen  10 mg Oral QID  . ceFEPime (MAXIPIME) IV  1 g Intravenous Q8H  . Chlorhexidine Gluconate Cloth  6 each Topical Q0600  . enoxaparin (LOVENOX) injection  40 mg Subcutaneous Q24H  . feeding supplement (ENSURE ENLIVE)  237 mL Oral BID BM  . fentaNYL  50 mcg Transdermal Q72H  . fentaNYL  75 mcg Transdermal Q72H  . ipratropium-albuterol  3 mL Nebulization Q6H  . Linaclotide  145 mcg Oral Q breakfast  . linezolid  600 mg Intravenous Q12H  . mupirocin ointment  1 application Nasal BID  . pregabalin  100 mg Oral BID  . rOPINIRole  0.5 mg Oral QHS   Continuous Infusions: . sodium chloride 100 mL/hr at 10/07/14 0609  . 0.9 % NaCl with KCl 20 mEq / L 100 mL/hr at 10/08/14 1610    Principal Problem:   HCAP (healthcare-associated pneumonia) Active Problems:   Cough   Paraplegia   Chronic pain   Sacral decubitus ulcer    Time  spent: 25 min    [facility]  [facility] Pager [phone]. If 7PM-7AM, please contact night-coverage at [email or web address], password [facility] 10/08/2014, 11:00 AM  LOS: 3 days

## 2014-10-08 NOTE — Progress Notes (Signed)
No new complaints. Feels better. No frank hemoptysis  Filed Vitals:   10/08/14 1433 10/08/14 1753 10/08/14 2016 10/08/14 2109  BP:  134/78 138/102   Pulse:    106  Temp:      TempSrc:      Resp:  23 26 24   Height:      Weight:      SpO2: 98% 99% 95% 96%   NAD HEENT WNL Scattered rhonchi Reg, no M Abd soft, +BS Ext warm without edema  BMET    Component Value Date/Time   NA 138 10/08/2014 0713   K 4.1 10/08/2014 0713   CL 108 10/08/2014 0713   CO2 22 10/08/2014 0713   GLUCOSE 109* 10/08/2014 0713   BUN 5* 10/08/2014 0713   CREATININE 0.44* 10/08/2014 0713   CALCIUM 8.3* 10/08/2014 0713   GFRNONAA >90 10/08/2014 0713   GFRAA >90 10/08/2014 0713    CBC    Component Value Date/Time   WBC 5.1 10/08/2014 0713   RBC 3.69* 10/08/2014 0713   HGB 10.1* 10/08/2014 0713   HCT 30.0* 10/08/2014 0713   PLT 136* 10/08/2014 0713   MCV 81.3 10/08/2014 0713   MCH 27.4 10/08/2014 0713   MCHC 33.7 10/08/2014 0713   RDW 14.2 10/08/2014 0713   LYMPHSABS 0.9 05/23/2014 2308   MONOABS 0.4 05/23/2014 2308   EOSABS 0.1 05/23/2014 2308   BASOSABS 0.0 05/23/2014 2308    CXR: NSC  IMPRESSION: Paraplegia RLL PNA, resp cx positive for staph aereus Hemoptysis - likely due to PNA. Essentially resolved Resp distress - improved  PLAN/REC: Transfer to med-surg if OK with Dr Sharl MaLama Complete 10 days abx  Narrow coverage based on sensitivities Cont airway hygiene   PCCM will sign off. Please call if we can be of further assistance   Billy Fischeravid Almalik Weissberg, MD ; [facility] Mobile [phone].  After 5:30 PM or weekends, call [phone]

## 2014-10-09 LAB — CULTURE, RESPIRATORY W GRAM STAIN

## 2014-10-09 LAB — CULTURE, RESPIRATORY

## 2014-10-09 MED ORDER — IPRATROPIUM-ALBUTEROL 0.5-2.5 (3) MG/3ML IN SOLN
3.0000 mL | Freq: Three times a day (TID) | RESPIRATORY_TRACT | Status: DC
  Administered 2014-10-10: 3 mL via RESPIRATORY_TRACT
  Filled 2014-10-09: qty 3
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
## 2014-10-09 NOTE — Care Management Note (Signed)
    Page 1 of 1   10/09/2014     1:43:21 PM CARE MANAGEMENT NOTE 10/09/2014  Patient:  Alan Warner,Alan Warner   Account Number:  [id number]  Date Initiated:  10/09/2014  Documentation initiated by:  Lorenda IshiharaPEELE,Taquisha Phung  Subjective/Objective Assessment:   54 yo male admitted with PNA. [facility]     Action/Plan:   Return to SNF when stable   Anticipated DC Date:  10/11/2014   Anticipated DC Plan:  SKILLED NURSING FACILITY  In-house referral  Clinical Social Worker      DC Planning Services  CM consult      Choice offered to / List presented to:             Status of service:  Completed, signed off Medicare Important Message given?  YES (If response is "NO", the following Medicare IM given date fields will be blank) Date Medicare IM given:  10/09/2014 Medicare IM given by:  [facility]Yotam Rhine Date Additional Medicare IM given:   Additional Medicare IM given by:    Discharge Disposition:  SKILLED NURSING FACILITY  Per UR Regulation:  Reviewed for med. necessity/level of care/duration of stay  If discussed at Long Length of Stay Meetings, dates discussed:    Comments:

## 2014-10-09 NOTE — Progress Notes (Signed)
[facility] PROGRESS NOTE  Alan DAUPHINAIS ZOX:[id number] DOB: June 08, 1961 DOA: 10/05/2014 PCP: Eloisa Northern, MD  Assessment/Plan: 1. Pneumonia - Improved, cxr this morning shows persistent right  hazy opacity. Continue zyvox and cefepime. Will change to po antibiotics in am. 2. History of paraplegia- stable 3. Hypokalemia-  Replete, potassium is 4.1 as of 4/24 4. Chronic pain- stable, continue fentanyl patch and oxyIR when necessary 5. Sacral decubitus ulcer- no active issues patient follows wound center as outpatient. 6. Abnormal UA- patient has a history of previous colonization, urine culture has been sent. Urine culture growing contaminants.  Code Status: Full code Family Communication: No family at bedside Disposition Plan: SNF in 24 - 48 hrs   Consultants:  None  Procedures:  None  Antibiotics:  Cefepime 10/05/14- present  Doxycycline stopped for 10/06/14  Zyvox  Started on 10/06/14   HPI/Subjective: 54 y.o. male with a past medical history of paraplegia, chronic pain, sacral decubitus, which has healed, who comes into the hospital with complaints of cough for the last 2-3 days. He had a chest x-ray a few days ago, which did not show any pneumonia. He has been coughing up greenish brownish expectoration. He lives in a skilled nursing facility. He denies any fever, chills. This morning he was placed on oxygen. And he was then set sent over to the hospital for further management. He denies any nausea, vomiting, but has had dry heaves. No abdominal pain. No diarrhea. Denies any nasal drainage. He denies any chest pain. Has had a few episodes of shortness of breath, but none currently. His cough appears to be improved over the course of the last fewhours. He had a low-grade fever in the hospital. He was saturating 90-92 percent on 2 L by nasal cannula.  Patient seen and examined, was transferred back to the floor. Breathing has improved. Sputum culture growing  MRSA  Objective: Filed Vitals:   10/09/14 1400  BP: 91/49  Pulse: 90  Temp: 98.6 F (37 C)  Resp: 16    Intake/Output Summary (Last 24 hours) at 10/09/14 1652 Last data filed at 10/09/14 1400  Gross per 24 hour  Intake    644 ml  Output   2025 ml  Net  -1381 ml   Filed Weights   10/05/14 1428 10/07/14 0915  Weight: 79.379 kg (175 lb) 75.7 kg (166 lb 14.2 oz)    Exam:   General:  Mild respiratory distress  Cardiovascular: S1-S2 regular  Respiratory: Decreased breath sounds bilaterally  Abdomen: Soft, nontender, no organomegaly  Musculoskeletal: No edema noted in the lower extremities   Data Reviewed: Basic Metabolic Panel:  Recent Labs Lab 10/05/14 0956 10/06/14 0454 10/07/14 0035 10/08/14 0713  NA 135 134* 136 138  K 3.5 4.0 3.2* 4.1  CL 106 105 104 108  CO2 GLUCOSE 111* 140* 140* 109*  BUN 5* 7 8 5*  CREATININE 0.43* 0.45* 0.58 0.44*  CALCIUM 8.2* 8.1* 8.0* 8.3*   Liver Function Tests:  Recent Labs Lab 10/06/14 0454 10/07/14 0035 10/08/14 0713  AST ALT 5 8 9   ALKPHOS 69 68 61  BILITOT 2.5* 1.7* 1.6*  PROT 6.8 6.7 6.3  ALBUMIN 2.8* 2.7* 2.5*   No results for input(s): LIPASE, AMYLASE in the last 168 hours. No results for input(s): AMMONIA in the last 168 hours. CBC:  Recent Labs Lab 10/05/14 0956 10/06/14 0454 10/07/14 0035 10/08/14 0713  WBC 4.7 4.7 6.0 5.1  HGB 12.6*  11.9* 10.8* 10.1*  HCT 37.5* 34.5* 32.7* 30.0*  MCV 81.2 81.4 81.8 81.3  PLT 116* 116* 122* 136*   Cardiac Enzymes: No results for input(s): CKTOTAL, CKMB, CKMBINDEX, TROPONINI in the last 168 hours. BNP (last 3 results)  Recent Labs  10/06/14 0454  BNP 97.5    ProBNP (last 3 results) No results for input(s): PROBNP in the last 8760 hours.  CBG: No results for input(s): GLUCAP in the last 168 hours.  Recent Results (from the past 240 hour(s))  Blood culture (routine x 2)     Status: None (Preliminary result)   Collection Time:  10/05/14 10:16 AM  Result Value Ref Range Status   Specimen Description BLOOD LEFT HAND  Final   Special Requests BOTTLES DRAWN AEROBIC AND ANAEROBIC 2ML  Final   Culture   Final           BLOOD CULTURE RECEIVED NO GROWTH TO DATE CULTURE WILL BE HELD FOR 5 DAYS BEFORE ISSUING A FINAL NEGATIVE REPORT [facility]    Report Status PENDING  Incomplete  Blood culture (routine x 2)     Status: None (Preliminary result)   Collection Time: 10/05/14 10:45 AM  Result Value Ref Range Status   Specimen Description BLOOD RIGHT ANTECUBITAL  Final   Special Requests BOTTLES DRAWN AEROBIC AND ANAEROBIC 3ML  Final   Culture   Final           BLOOD CULTURE RECEIVED NO GROWTH TO DATE CULTURE WILL BE HELD FOR 5 DAYS BEFORE ISSUING A FINAL NEGATIVE REPORT Performed at [facility]    Report Status PENDING  Incomplete  Urine culture     Status: None   Collection Time: 10/05/14 12:51 PM  Result Value Ref Range Status   Specimen Description URINE, CATHETERIZED  Final   Special Requests NONE  Final   Colony Count   Final    >=100,000 COLONIES/ML Performed at [facility]    Culture   Final    Multiple bacterial morphotypes present, none predominant. Suggest appropriate recollection if clinically indicated. Performed at [facility]    Report Status 10/06/2014 FINAL  Final  Culture, respiratory (NON-Expectorated)     Status: None   Collection Time: 10/06/14 10:58 AM  Result Value Ref Range Status   Specimen Description SPUTUM  Final   Special Requests NONE  Final   Gram Stain   Final    ABUNDANT WBC PRESENT, PREDOMINANTLY PMN FEW SQUAMOUS EPITHELIAL CELLS PRESENT RARE GRAM POSITIVE COCCI IN CLUSTERS RARE YEAST WITH PSEUDOHYPHAE Performed at [facility]    Culture   Final    FEW METHICILLIN RESISTANT STAPHYLOCOCCUS AUREUS Note: RIFAMPIN AND GENTAMICIN SHOULD NOT BE USED AS SINGLE DRUGS FOR TREATMENT OF STAPH INFECTIONS. CRITICAL RESULT CALLED  TO, READ BACK BY AND VERIFIED WITH: 945AM 10/09/14 GUSTK Performed at [facility]    Report Status 10/09/2014 FINAL  Final   Organism ID, Bacteria METHICILLIN RESISTANT STAPHYLOCOCCUS AUREUS  Final      Susceptibility   Methicillin resistant staphylococcus aureus - MIC*    CLINDAMYCIN <=0.25 SENSITIVE Sensitive     ERYTHROMYCIN <=0.25 SENSITIVE Sensitive     GENTAMICIN <=0.5 SENSITIVE Sensitive     LEVOFLOXACIN 4 INTERMEDIATE Intermediate     OXACILLIN >=4 RESISTANT Resistant     PENICILLIN >=0.5 RESISTANT Resistant     RIFAMPIN <=0.5 SENSITIVE Sensitive     TRIMETH/SULFA <=10 SENSITIVE Sensitive     VANCOMYCIN 1 SENSITIVE Sensitive  TETRACYCLINE <=1 SENSITIVE Sensitive     * FEW METHICILLIN RESISTANT STAPHYLOCOCCUS AUREUS  Culture, sputum-assessment     Status: None   Collection Time: 10/06/14  8:58 PM  Result Value Ref Range Status   Specimen Description SPUTUM  Final   Special Requests NONE  Final   Sputum evaluation   Final    THIS SPECIMEN IS ACCEPTABLE. RESPIRATORY CULTURE REPORT TO FOLLOW.   Report Status 10/06/2014 FINAL  Final  MRSA PCR Screening     Status: Abnormal   Collection Time: 10/07/14  9:10 AM  Result Value Ref Range Status   MRSA by PCR POSITIVE (A) NEGATIVE Final    Comment:        The GeneXpert MRSA Assay (FDA approved for NASAL specimens only), is one component of a comprehensive MRSA colonization surveillance program. It is not intended to diagnose MRSA infection nor to guide or monitor treatment for MRSA infections. RESULT CALLED TO, READ BACK BY AND VERIFIED WITH: S DILLON AT 1358 ON 04.23.2016 BY NBROOKS      Studies: Dg Chest Port 1 View  10/08/2014   CLINICAL DATA:  Shortness of breath, history of pneumonia  EXAM: PORTABLE CHEST - 1 VIEW  COMPARISON:  10/06/2014  FINDINGS: Cervical fusion hardware partly visualized. Severe rightward curvature of the thoracic spine reidentified centered at T6. Hazy right basilar airspace opacity is  reidentified without significant interval change. Bilateral trace pleural fluid or thickening. Mild enlargement of the cardiac silhouette reidentified without evidence for edema.  IMPRESSION: Stable patchy hazy right basilar airspace opacity. This could represent pneumonia although other alveolar filling processes could appear similar.   Electronically Signed   By: Christiana Pellant M.D.   On: 10/08/2014 10:36    Scheduled Meds: . antiseptic oral rinse  7 mL Mouth Rinse BID  . baclofen  10 mg Oral QID  . ceFEPime (MAXIPIME) IV  1 g Intravenous Q8H  . Chlorhexidine Gluconate Cloth  6 each Topical Q0600  . enoxaparin (LOVENOX) injection  40 mg Subcutaneous Q24H  . feeding supplement (ENSURE ENLIVE)  237 mL Oral BID BM  . fentaNYL  50 mcg Transdermal Q72H  . fentaNYL  75 mcg Transdermal Q72H  . ipratropium-albuterol  3 mL Nebulization Q6H  . Linaclotide  145 mcg Oral Q breakfast  . linezolid  600 mg Intravenous Q12H  . mupirocin ointment  1 application Nasal BID  . pregabalin  100 mg Oral BID  . rOPINIRole  0.5 mg Oral QHS   Continuous Infusions: . sodium chloride 10 mL/hr at 10/08/14 1636    Principal Problem:   HCAP (healthcare-associated pneumonia) Active Problems:   Cough   Paraplegia   Chronic pain   Sacral decubitus ulcer   Hemoptysis   Staphylococcal pneumonia    Time spent: 25 min    [facility]  [facility] Pager [phone]. If 7PM-7AM, please contact night-coverage at [email or web address], password [facility] 10/09/2014, 4:52 PM  LOS: 4 days

## 2014-10-09 NOTE — Progress Notes (Signed)
Pt refusing to take medications until he can receive his pain medication. I educated him that the medications are on a schedule and should be given at this time. Pt stated "I'm not trying to be hard, but I'm not going to take my medication until I can get my oxy."

## 2014-10-10 MED ORDER — FENTANYL 75 MCG/HR TD PT72: 75.0000 ug | MEDICATED_PATCH | TRANSDERMAL | Status: DC

## 2014-10-10 MED ORDER — FENTANYL 50 MCG/HR TD PT72: 50.0000 ug | MEDICATED_PATCH | TRANSDERMAL | Status: DC

## 2014-10-10 MED ORDER — DOXYCYCLINE HYCLATE 100 MG PO CAPS: 100.0000 mg | ORAL_CAPSULE | Freq: Two times a day (BID) | ORAL | Status: AC

## 2014-10-10 MED ORDER — IPRATROPIUM-ALBUTEROL 0.5-2.5 (3) MG/3ML IN SOLN: 3.0000 mL | Freq: Three times a day (TID) | RESPIRATORY_TRACT | Status: AC

## 2014-10-10 MED ORDER — BENZONATATE 100 MG PO CAPS: 100.0000 mg | ORAL_CAPSULE | Freq: Three times a day (TID) | ORAL | Status: DC | PRN

## 2014-10-10 MED ORDER — OXYCODONE HCL 15 MG PO TABS: 15.0000 mg | ORAL_TABLET | ORAL | Status: DC | PRN

## 2014-10-10 MED ORDER — ENSURE ENLIVE PO LIQD: 237.0000 mL | Freq: Two times a day (BID) | ORAL | Status: DC

## 2014-10-10 NOTE — Discharge Summary (Signed)
Physician Discharge Summary  Alan Warner:[id number] DOB: [birth year] DOA: 10/05/2014  PCP: Eloisa Northern, MD  Admit date: 10/05/2014 Discharge date: 10/10/2014  Time spent: 25* minutes  Recommendations for Outpatient Follow-up:  1. *Follow up PCP in 2 weeks  Discharge Diagnoses:  Principal Problem:   HCAP (healthcare-associated pneumonia) Active Problems:   Cough   Paraplegia   Chronic pain   Sacral decubitus ulcer   Hemoptysis   Staphylococcal pneumonia   Discharge Condition: *Stable  Diet recommendation: Regular diet  Filed Weights   10/05/14 1428 10/07/14 0915  Weight: 79.379 kg (175 lb) 75.7 kg (166 lb 14.2 oz)    History of present illness:  54 y.o. male with a past medical history of paraplegia, chronic pain, sacral decubitus, which has healed, who comes into the hospital with complaints of cough for the last 2-3 days. He had a chest x-ray a few days ago, which did not show any pneumonia. He has been coughing up greenish brownish expectoration. He lives in a skilled nursing facility. He denies any fever, chills. This morning he was placed on oxygen. And he was then set sent over to the hospital for further management. He denies any nausea, vomiting, but has had dry heaves. No abdominal pain. No diarrhea. Denies any nasal drainage. He denies any chest pain. Has had a few episodes of shortness of breath, but none currently. His cough appears to be improved over the course of the last fewhours. He had a low-grade fever in the hospital.  Hospital Course:  1. Pneumonia - patient was admitted with pneumonia, started having dark brown phelgm, with possible hemoptysis. He also developed acute respiratory distress sos he was transferred to step down unit. He was seen by pulmonary as consult and was transferred back to floor after he improved. Initially he was started on Doxycycline but due to worsening respiratory status he was switched to Zyvox. He has completed five days of  antibiotic therapy in the hospital. Sputum culture is growing MRSA sensitive to tetracycline, he will be discharged on Doxycycline 100 mg po bid for five more days. Continue the Duoneb tid. Blood cultures are negative so far. Patient's respiratory status has improved. 2. History of paraplegia- stable 3. Hypokalemia- Replete, potassium is 4.1 as of 4/24 4. Chronic pain- stable, continue fentanyl patch and oxyIR when necessary 5. Sacral decubitus ulcer- no active issues patient follows wound center as outpatient. 6. Abnormal UA- patient has a history of previous colonization, urine culture has been sent. Urine culture growing contaminants.  Procedures:  *None  Consultations:  Pulmonary  Discharge Exam: Filed Vitals:   10/10/14 0550  BP: 118/73  Pulse: 62  Temp: 98.3 F (36.8 C)  Resp: 16    General: Appear in no acute distress Cardiovascular: S1s2 RRR Respiratory: Clear bilaterally  Discharge Instructions   Discharge Instructions    Diet - low sodium heart healthy    Complete by:  As directed      Increase activity slowly    Complete by:  As directed           Current Discharge Medication List    START taking these medications   Details  benzonatate (TESSALON) 100 MG capsule Take 1 capsule (100 mg total) by mouth 3 (three) times daily as needed for cough. Qty: 20 capsule, Refills: 0    doxycycline (VIBRAMYCIN) 100 MG capsule Take 1 capsule (100 mg total) by mouth 2 (two) times daily. Qty: 10 capsule, Refills: 0    feeding supplement,  ENSURE ENLIVE, (ENSURE ENLIVE) LIQD Take 237 mLs by mouth 2 (two) times daily between meals. Qty: 237 mL, Refills: 12    ipratropium-albuterol (DUONEB) 0.5-2.5 (3) MG/3ML SOLN Take 3 mLs by nebulization 3 (three) times daily. Qty: 360 mL, Refills: 0      CONTINUE these medications which have CHANGED   Details  fentaNYL (DURAGESIC - DOSED MCG/HR) 50 MCG/HR Place 1 patch (50 mcg total) onto the skin every 3 (three) days. APPLY WITH  75 MCG PATCH FOR A TOTAL OF 125 MCG Qty: 5 patch, Refills: 0    fentaNYL (DURAGESIC - DOSED MCG/HR) 75 MCG/HR Place 1 patch (75 mcg total) onto the skin every 3 (three) days. APPLY WITH 50 MCG PATCH FOR A TOTAL OF 125 MCG Qty: 5 patch, Refills: 0    oxyCODONE (ROXICODONE) 15 MG immediate release tablet Take 1 tablet (15 mg total) by mouth every 4 (four) hours as needed for pain. Qty: 30 tablet, Refills: 0      CONTINUE these medications which have NOT CHANGED   Details  acetaminophen (TYLENOL) 325 MG tablet Take 650 mg by mouth every 4 (four) hours as needed for pain or fever.    baclofen (LIORESAL) 10 MG tablet Take 10 mg by mouth 4 (four) times daily.     bisacodyl (DULCOLAX) 10 MG suppository Place 10 mg rectally daily as needed for constipation.    diphenhydrAMINE (BENADRYL) 25 MG tablet Take 25 mg by mouth every 4 (four) hours as needed for itching.    Linaclotide (LINZESS) 145 MCG CAPS capsule Take 145 mcg by mouth daily with breakfast.    loratadine (CLARITIN) 10 MG tablet Take 10 mg by mouth daily.    LORazepam (ATIVAN) 0.5 MG tablet Take 0.5 mg by mouth every 6 (six) hours as needed for anxiety.    oxybutynin (DITROPAN) 5 MG tablet Take 5 mg by mouth 4 (four) times daily.    polyethylene glycol (MIRALAX / GLYCOLAX) packet Take 17 g by mouth daily as needed for mild constipation. Takes scheduled every day, may also have 1 more packet a day if needed    potassium chloride (K-DUR,KLOR-CON) 10 MEQ tablet Take 10 mEq by mouth 2 (two) times daily.    pregabalin (LYRICA) 100 MG capsule Take 100 mg by mouth 2 (two) times daily.    pseudoephedrine (SUDAFED) 60 MG tablet Take 60 mg by mouth every 6 (six) hours as needed for congestion.    rOPINIRole (REQUIP) 0.5 MG tablet Take 0.5 mg by mouth at bedtime.    sodium phosphate (FLEET) enema Place 1 enema rectally as needed (for constipation). follow package directions       Allergies  Allergen Reactions  . Levaquin  [Levofloxacin In D5w]     PER MEDICATION MAR  . Penicillins     PER MEDICATION MAR  . Vancomycin Rash    PER MEDICATION MAR      The results of significant diagnostics from this hospitalization (including imaging, microbiology, ancillary and laboratory) are listed below for reference.    Significant Diagnostic Studies: Dg Chest 2 View (if Patient Has Fever And/or Copd)  10/05/2014   CLINICAL DATA:  Shortness of breath, productive cough  EXAM: CHEST  2 VIEW  COMPARISON:  06/26/2012  FINDINGS: Cardiomediastinal silhouette is unremarkable. Mild thoracic dextroscoliosis. No acute infiltrate or pleural effusion. No pulmonary edema.  IMPRESSION: No active cardiopulmonary disease.   Electronically Signed   By: Natasha Mead M.D.   On: 10/05/2014 11:32   Dg Chest  Port 1 View  10/08/2014   CLINICAL DATA:  Shortness of breath, history of pneumonia  EXAM: PORTABLE CHEST - 1 VIEW  COMPARISON:  10/06/2014  FINDINGS: Cervical fusion hardware partly visualized. Severe rightward curvature of the thoracic spine reidentified centered at T6. Hazy right basilar airspace opacity is reidentified without significant interval change. Bilateral trace pleural fluid or thickening. Mild enlargement of the cardiac silhouette reidentified without evidence for edema.  IMPRESSION: Stable patchy hazy right basilar airspace opacity. This could represent pneumonia although other alveolar filling processes could appear similar.   Electronically Signed   By: Christiana Pellant M.D.   On: 10/08/2014 10:36   Dg Chest Port 1 View  10/06/2014   CLINICAL DATA:  Dyspnea.  Cough last 3-4 days.  Smoker.  EXAM: PORTABLE CHEST - 1 VIEW  COMPARISON:  10/05/2014 and 06/26/2012  FINDINGS: Lungs are adequately inflated and demonstrate hazy opacification of the right base. No definite effusion. Minimal prominence of the perihilar markings are present. Cardiomediastinal silhouette is within normal. Moderate stable curvature of the thoracic spine convex  right. Hardware over the lower cervical spine.  IMPRESSION: Hazy airspace density over the right base likely a pneumonia.   Electronically Signed   By: Elberta Fortis M.D.   On: 10/06/2014 12:00    Microbiology: Recent Results (from the past 240 hour(s))  Blood culture (routine x 2)     Status: None (Preliminary result)   Collection Time: 10/05/14 10:16 AM  Result Value Ref Range Status   Specimen Description BLOOD LEFT HAND  Final   Special Requests BOTTLES DRAWN AEROBIC AND ANAEROBIC  Final   Culture   Final           BLOOD CULTURE RECEIVED NO GROWTH TO DATE CULTURE WILL BE HELD FOR 5 DAYS BEFORE ISSUING A FINAL NEGATIVE REPORT Performed at [facility]    Report Status PENDING  Incomplete  Blood culture (routine x 2)     Status: None (Preliminary result)   Collection Time: 10/05/14 10:45 AM  Result Value Ref Range Status   Specimen Description BLOOD RIGHT ANTECUBITAL  Final   Special Requests BOTTLES DRAWN AEROBIC AND ANAEROBIC  Final   Culture   Final           BLOOD CULTURE RECEIVED NO GROWTH TO DATE CULTURE WILL BE HELD FOR 5 DAYS BEFORE ISSUING A FINAL NEGATIVE REPORT Performed at [facility]    Report Status PENDING  Incomplete  Urine culture     Status: None   Collection Time: 10/05/14 12:51 PM  Result Value Ref Range Status   Specimen Description URINE, CATHETERIZED  Final   Special Requests NONE  Final   Colony Count   Final    >=100,000 COLONIES/ML Performed at [facility]    Culture   Final    Multiple bacterial morphotypes present, none predominant. Suggest appropriate recollection if clinically indicated. Performed at [facility]    Report Status 10/06/2014 FINAL  Final  Culture, respiratory (NON-Expectorated)     Status: None   Collection Time: 10/06/14 10:58 AM  Result Value Ref Range Status   Specimen Description SPUTUM  Final   Special Requests NONE  Final   Gram Stain   Final    ABUNDANT WBC PRESENT,  PREDOMINANTLY PMN FEW SQUAMOUS EPITHELIAL CELLS PRESENT RARE GRAM POSITIVE COCCI IN CLUSTERS RARE YEAST WITH PSEUDOHYPHAE Performed at [facility]    Culture   Final    FEW METHICILLIN RESISTANT  STAPHYLOCOCCUS AUREUS Note: RIFAMPIN AND GENTAMICIN SHOULD NOT BE USED AS SINGLE DRUGS FOR TREATMENT OF STAPH INFECTIONS. CRITICAL RESULT CALLED TO, READ BACK BY AND VERIFIED WITH: 945AM 10/09/14 GUSTK Performed at [facility]    Report Status 10/09/2014 FINAL  Final   Organism ID, Bacteria METHICILLIN RESISTANT STAPHYLOCOCCUS AUREUS  Final      Susceptibility   Methicillin resistant staphylococcus aureus - MIC*    CLINDAMYCIN <=0.25 SENSITIVE Sensitive     ERYTHROMYCIN <=0.25 SENSITIVE Sensitive     GENTAMICIN <=0.5 SENSITIVE Sensitive     LEVOFLOXACIN 4 INTERMEDIATE Intermediate     OXACILLIN >=4 RESISTANT Resistant     PENICILLIN >=0.5 RESISTANT Resistant     RIFAMPIN <=0.5 SENSITIVE Sensitive     TRIMETH/SULFA <=10 SENSITIVE Sensitive     VANCOMYCIN 1 SENSITIVE Sensitive     TETRACYCLINE <=1 SENSITIVE Sensitive     * FEW METHICILLIN RESISTANT STAPHYLOCOCCUS AUREUS  Culture, sputum-assessment     Status: None   Collection Time: 10/06/14  8:58 PM  Result Value Ref Range Status   Specimen Description SPUTUM  Final   Special Requests NONE  Final   Sputum evaluation   Final    THIS SPECIMEN IS ACCEPTABLE. RESPIRATORY CULTURE REPORT TO FOLLOW.   Report Status 10/06/2014 FINAL  Final  MRSA PCR Screening     Status: Abnormal   Collection Time: 10/07/14  9:10 AM  Result Value Ref Range Status   MRSA by PCR POSITIVE (A) NEGATIVE Final    Comment:        The GeneXpert MRSA Assay (FDA approved for NASAL specimens only), is one component of a comprehensive MRSA colonization surveillance program. It is not intended to diagnose MRSA infection nor to guide or monitor treatment for MRSA infections. RESULT CALLED TO, READ BACK BY AND VERIFIED WITH: S DILLON AT 1358 ON  04.23.2016 BY NBROOKS      Labs: Basic Metabolic Panel:  Recent Labs Lab 10/05/14 0956 10/06/14 0454 10/07/14 0035 10/08/14 0713  NA 135 134* 136 138  K 3.5 4.0 3.2* 4.1  CL 106 105 104 108  CO2 21 22 25 22   GLUCOSE 111* 140* 140* 109*  BUN 5* 7 8 5*  CREATININE 0.43* 0.45* 0.58 0.44*  CALCIUM 8.2* 8.1* 8.0* 8.3*   Liver Function Tests:  Recent Labs Lab 10/06/14 0454 10/07/14 0035 10/08/14 0713  AST 23 11 24   ALT 5 8 9   ALKPHOS 69 68 61  BILITOT 2.5* 1.7* 1.6*  PROT 6.8 6.7 6.3  ALBUMIN 2.8* 2.7* 2.5*   No results for input(s): LIPASE, AMYLASE in the last 168 hours. No results for input(s): AMMONIA in the last 168 hours. CBC:  Recent Labs Lab 10/05/14 0956 10/06/14 0454 10/07/14 0035 10/08/14 0713  WBC 4.7 4.7 6.0 5.1  HGB 12.6* 11.9* 10.8* 10.1*  HCT 37.5* 34.5* 32.7* 30.0*  MCV 81.2 81.4 81.8 81.3  PLT 116* 116* 122* 136*   Cardiac Enzymes: No results for input(s): CKTOTAL, CKMB, CKMBINDEX, TROPONINI in the last 168 hours. BNP: BNP (last 3 results)  Recent Labs  10/06/14 0454  BNP 97.5    ProBNP (last 3 results) No results for input(s): PROBNP in the last 8760 hours.  CBG: No results for input(s): GLUCAP in the last 168 hours.     SignedMauro Kaufmann:  Achille Xiang S  [facility] 10/10/2014, 10:29 AM

## 2014-10-10 NOTE — Progress Notes (Signed)
CSW assisting with d/c planning . Pt in agreement with plan to return to SNF today. [facility] transport required. Pt's mother contacted at pt's request and updated [facility]. NSG reviewed d/c summary, scripts, avs. Scripts included in d/c packet. D/C summary sent to SNF for review prior to d/c.  Cori RazorJamie Kerrianne Jeng LCSW [phone]

## 2014-10-10 NOTE — Progress Notes (Signed)
Report [facility]  [facility]. Marland Kitchen.[place] Citizen.Caroline.  Patient aware and anxious to return.

## 2014-10-11 LAB — CULTURE, BLOOD (ROUTINE X 2)
Culture: NO GROWTH
Culture: NO GROWTH

## 2014-11-02 ENCOUNTER — Emergency Department (HOSPITAL_COMMUNITY)
Admission: EM | Admit: 2014-11-02 | Discharge: 2014-11-02 | Disposition: A | Discharge status: Home / Self Care | Payer: Medicare Other | Attending: Emergency Medicine | Admitting: Emergency Medicine

## 2014-11-02 ENCOUNTER — Encounter (HOSPITAL_COMMUNITY): Payer: Self-pay | Admitting: Emergency Medicine

## 2014-11-02 ENCOUNTER — Emergency Department (HOSPITAL_COMMUNITY): Payer: Medicare Other

## 2014-11-02 DIAGNOSIS — Z720 Tobacco use: Secondary | ICD-10-CM | POA: Insufficient documentation

## 2014-11-02 DIAGNOSIS — Z8739 Personal history of other diseases of the musculoskeletal system and connective tissue: Secondary | ICD-10-CM | POA: Diagnosis not present

## 2014-11-02 DIAGNOSIS — R630 Anorexia: Secondary | ICD-10-CM | POA: Insufficient documentation

## 2014-11-02 DIAGNOSIS — Z880 Allergy status to penicillin: Secondary | ICD-10-CM | POA: Insufficient documentation

## 2014-11-02 DIAGNOSIS — N390 Urinary tract infection, site not specified: Secondary | ICD-10-CM | POA: Insufficient documentation

## 2014-11-02 DIAGNOSIS — R109 Unspecified abdominal pain: Secondary | ICD-10-CM | POA: Diagnosis present

## 2014-11-02 DIAGNOSIS — Z8669 Personal history of other diseases of the nervous system and sense organs: Secondary | ICD-10-CM | POA: Insufficient documentation

## 2014-11-02 DIAGNOSIS — Z79899 Other long term (current) drug therapy: Secondary | ICD-10-CM | POA: Diagnosis not present

## 2014-11-02 LAB — URINE MICROSCOPIC-ADD ON

## 2014-11-02 LAB — CBC WITH DIFFERENTIAL/PLATELET
BASOS PCT: 0 % (ref 0–1)
Basophils Absolute: 0 10*3/uL (ref 0.0–0.1)
EOS ABS: 0 10*3/uL (ref 0.0–0.7)
Eosinophils Relative: 0 % (ref 0–5)
HEMATOCRIT: 33.7 % — AB (ref 39.0–52.0)
Hemoglobin: 11.5 g/dL — ABNORMAL LOW (ref 13.0–17.0)
Lymphocytes Relative: 12 % (ref 12–46)
Lymphs Abs: 0.6 10*3/uL — ABNORMAL LOW (ref 0.7–4.0)
MCH: 28.3 pg (ref 26.0–34.0)
MCHC: 34.1 g/dL (ref 30.0–36.0)
MCV: 82.8 fL (ref 78.0–100.0)
MONO ABS: 0.4 10*3/uL (ref 0.1–1.0)
Monocytes Relative: 9 % (ref 3–12)
Neutro Abs: 3.6 10*3/uL (ref 1.7–7.7)
Neutrophils Relative %: 79 % — ABNORMAL HIGH (ref 43–77)
PLATELETS: 78 10*3/uL — AB (ref 150–400)
RBC: 4.07 MIL/uL — ABNORMAL LOW (ref 4.22–5.81)
RDW: 17.5 % — AB (ref 11.5–15.5)
WBC: 4.6 10*3/uL (ref 4.0–10.5)

## 2014-11-02 LAB — COMPREHENSIVE METABOLIC PANEL
ALT: 67 U/L — AB (ref 17–63)
AST: 41 U/L (ref 15–41)
Albumin: 2.7 g/dL — ABNORMAL LOW (ref 3.5–5.0)
Alkaline Phosphatase: 213 U/L — ABNORMAL HIGH (ref 38–126)
Anion gap: 7 (ref 5–15)
BUN: 8 mg/dL (ref 6–20)
CO2: 22 mmol/L (ref 22–32)
Calcium: 8.6 mg/dL — ABNORMAL LOW (ref 8.9–10.3)
Chloride: 109 mmol/L (ref 101–111)
Creatinine, Ser: 0.59 mg/dL — ABNORMAL LOW (ref 0.61–1.24)
GFR calc Af Amer: >60 mL/min (ref >60)
GFR calc non Af Amer: >60 mL/min (ref >60)
Glucose, Bld: 107 mg/dL — ABNORMAL HIGH (ref 65–99)
Potassium: 3.2 mmol/L — ABNORMAL LOW (ref 3.5–5.1)
SODIUM: 138 mmol/L (ref 135–145)
TOTAL PROTEIN: 6.9 g/dL (ref 6.5–8.1)
Total Bilirubin: 1.3 mg/dL — ABNORMAL HIGH (ref 0.3–1.2)

## 2014-11-02 LAB — URINALYSIS, ROUTINE W REFLEX MICROSCOPIC
Bilirubin Urine: NEGATIVE
Glucose, UA: NEGATIVE mg/dL
Ketones, ur: NEGATIVE mg/dL
Nitrite: POSITIVE — AB
Protein, ur: NEGATIVE mg/dL
Specific Gravity, Urine: 1.009 (ref 1.005–1.030)
Urobilinogen, UA: 1 mg/dL (ref 0.0–1.0)
pH: 8 (ref 5.0–8.0)

## 2014-11-02 LAB — LIPASE, BLOOD: LIPASE: 16 U/L — AB (ref 22–51)

## 2014-11-02 MED ORDER — IOHEXOL 300 MG/ML  SOLN
25.0000 mL | Freq: Once | INTRAMUSCULAR | Status: AC | PRN
  Administered 2014-11-02: 25 mL via ORAL

## 2014-11-02 MED ORDER — HYDROMORPHONE HCL 1 MG/ML IJ SOLN
1.0000 mg | Freq: Once | INTRAMUSCULAR | Status: AC
  Administered 2014-11-02: 1 mg via INTRAVENOUS
  Filled 2014-11-02: qty 1

## 2014-11-02 MED ORDER — IOHEXOL 300 MG/ML  SOLN
100.0000 mL | Freq: Once | INTRAMUSCULAR | Status: DC | PRN
Start: 2014-11-02 — End: 2014-11-03

## 2014-11-02 MED ORDER — CEPHALEXIN 500 MG PO CAPS: 500.0000 mg | ORAL_CAPSULE | Freq: Four times a day (QID) | ORAL | Status: DC

## 2014-11-02 MED ORDER — OXYCODONE HCL 5 MG PO TABS
10.0000 mg | ORAL_TABLET | Freq: Once | ORAL | Status: AC
  Administered 2014-11-02: 10 mg via ORAL
  Filled 2014-11-02: qty 2

## 2014-11-02 MED ORDER — DEXTROSE 5 % IV SOLN
1.0000 g | Freq: Once | INTRAVENOUS | Status: AC
  Administered 2014-11-02: 1 g via INTRAVENOUS
  Filled 2014-11-02: qty 10

## 2014-11-02 MED ORDER — SODIUM CHLORIDE 0.9 % IV BOLUS (SEPSIS)
500.0000 mL | Freq: Once | INTRAVENOUS | Status: AC
  Administered 2014-11-02: 500 mL via INTRAVENOUS

## 2014-11-02 NOTE — ED Notes (Signed)
Pt reports bp is normally low. At current time 92/69

## 2014-11-02 NOTE — Discharge Instructions (Signed)
Catheter-Associated Urinary Tract Infection FAQs °WHAT IS "CATHETER-ASSOCIATED" URINARY TRACT INFECTION? °A urinary tract infection (also called "UTI") is an infection in the urinary system, which includes the bladder (which stores the urine) and the kidneys (which filter the blood to make urine). Germs (for example, bacteria or yeasts) do not normally live in these areas; but if germs are introduced, an infection can occur. If you have a urinary catheter, germs can travel along the catheter and cause an infection in your bladder or your kidney; in that case it is called a catheter-associated urinary tract infection (or "CA-UTI").  °WHAT IS A URINARY CATHETER? °A urinary catheter is a thin tube placed in the bladder to drain urine. Urine drains through the tube into a bag that collects the urine. A urinary  °catheter may be used: °· If you are not able to urinate on your own. °· To measure the amount of urine that you make, for example, during intensive care. °· During and after some types of surgery. °· During some tests of the kidneys and bladder . °People with urinary catheters have a much higher chance of getting a urinary tract infection than people who don't have a catheter. °HOW DO I GET A CATHETER-ASSOCIATED URINARY TRACT INFECTION (CA-UTI)? °If germs enter the urinary tract, they may cause an infection. Many of the germs that cause a catheter-associated urinary tract infection are common germs found in your intestines that do not usually cause an infection there. Germs can enter the urinary tract when the catheter is being put in or while the catheter remains in the bladder.  °WHAT ARE THE SYMPTOMS OF A URINARY TRACT INFECTION?  °Some of the common symptoms of a urinary tract infection are: °· Burning or pain in the lower abdomen (that is, below the stomach). °· Fever. °· Bloody urine may be a sign of infection, but is also caused by other problems . °· Burning during urination or an increase in the  frequency of urination after the catheter is removed. °Sometimes people with catheter-associated urinary tract infections do not have these symptoms of infection. °CAN CATHETER-ASSOCIATED URINARY TRACT INFECTIONS BE TREATED? °Yes, most catheter-associated urinary tract infections can be treated with antibiotics and removal or change of the catheter. Your doctor will determine which antibiotic is best for you.  °WHAT ARE SOME OF THE THINGS THAT HOSPITALS ARE DOING TO PREVENT CATHETER-ASSOCIATED URINARY TRACT INFECTIONS? °To prevent urinary tract infections, doctors and nurses take the following actions.  °Catheter insertion °· Catheters are put in only when necessary and they are removed as soon as possible. °· Only properly trained persons insert catheters using sterile ("clean") technique. °· The skin in the area where the catheter will be inserted is cleaned before inserting the catheter. °· Other methods to drain the urine are sometimes used, such as: °¨ External catheters in men (these look like condoms and are placed over the penis rather than into the penis) °¨ Putting a temporary catheter in to drain the urine and removing it right away. This is called intermittent urethral catheterization. °Catheter care °· Healthcare providers clean their hands by washing them with soap and water or using an alcohol-based hand rub before and after touching your catheter. °¨ If you do not see your providers clean their hands, please ask them to do so. °· Avoid disconnecting the catheter and drain tube. This helps to prevent germs from getting into the catheter tube. °· The catheter is secured to the leg to prevent pulling on the   catheter. °· Avoid twisting or kinking the catheter. °· Keep the bag lower than the bladder to prevent urine from backflowing to the bladder. °· Empty the bag regularly. The drainage spout should not touch anything while emptying the bag. °WHAT CAN I DO TO HELP PREVENT CATHETER-ASSOCIATED URINARY  TRACT INFECTIONS IF I HAVE A CATHETER? °· Always clean your hands before and after doing catheter care. °· Always keep your urine bag below the level of your bladder. °· Do not tug or pull on the tubing. °· Do not twist or kink the catheter tubing. °· Ask your healthcare provider each day if you still need the catheter. °WHAT DO I NEED TO DO WHEN I GO HOME FROM THE HOSPITAL? °· If you will be going home with a catheter, your doctor or nurse should explain everything you need to know about taking care of the catheter. Make sure you understand how to care for it before you leave the hospital. °· If you develop any of the symptoms of a urinary tract infection, such as burning or pain in the lower abdomen, fever, or an increase in the frequency of urination, contact your doctor or nurse immediately. °· Before you go home, make sure you know who to contact if you have questions or problems after you get home. °If you have questions, please ask your doctor or nurse. °Developed and co-sponsored by The Society for Healthcare Epidemiology of America (SHEA); Infectious Diseases Society of America (IDSA); The American Hospital Association; Association for Professionals in Infection Control and Epidemiology (APIC); Center for Disease Control (CDC); and The Joint Commission °Document Released: 02/25/2012 Document Reviewed: 02/25/2012 °ExitCare® Patient Information ©2015 [employer]. This information is not intended to replace advice given to you by your health care provider. Make sure you discuss any questions you have with your health care provider. ° °

## 2014-11-02 NOTE — ED Notes (Signed)
Per pt, catheter was changed 10/15/14.

## 2014-11-02 NOTE — ED Provider Notes (Signed)
CT results showed marked increased stool in the distal colon however on physical exam patient does not have stool obstruction and had a small amount of stool when evaluated. Also CT showed chronic wall thickening of the rectum, chronic biliary dilation and questionable chronic duodenitis. Patient has no upper abdominal tenderness on my exam and has some suprapubic tenderness with palpation. This is most likely from a urinary tract infection. Patient's suprapubic catheter is draining.  Patient last had urine cultures that grew Pseudomonas 3 years ago. He has no signs of sepsis with a normal white blood cell count, normal vital signs and no nausea or vomiting. Patient once to go home at this time feel be reasonable to do a dose of Rocephin and oral antibiotics to return if symptoms worsen.  Gwyneth SproutWhitney Jezabel Lecker, MD 11/02/14 [phone]

## 2014-11-02 NOTE — ED Provider Notes (Signed)
CSN: [id number]     Arrival date & time 11/02/14  1424 History   First MD Initiated Contact with Patient 11/02/14 1437     Chief Complaint  Patient presents with  . Abdominal Pain     (Consider location/radiation/quality/duration/timing/severity/associated sxs/prior Treatment) Patient is a 54 y.o. male presenting with abdominal pain. The history is provided by the patient.  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 Has had some abdominal pain for last couple days. He states he has been passing gas but not as much as normally. He has had nausea little bit of vomiting. He states the pain is diffuse. He does have somewhat limited sensation below his neck so it is somewhat difficult history. He denies fevers. He denies dysuria. He said previous abdominal surgery after a fish bone reportedly punctured him.  Past Medical History  Diagnosis Date  . Immobile, syndrome paraplegic   . 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 Hip arthrotomy Right   . Small intestine surgery     Family History  Problem Relation Age of Onset  . Sinusitis Sister    History  Substance Use Topics  . Smoking status: Current Every Day Smoker -- 0.50 packs/day    Types: Cigarettes  . Smokeless tobacco: Not on file  . Alcohol Use: No    Review of Systems  Constitutional: Positive for appetite change and fatigue.  Respiratory: Negative for choking.   Gastrointestinal: Positive for abdominal pain.      Allergies  Levaquin; Penicillins; and Vancomycin  Home Medications   Prior to Admission medications   Medication Sig Start Date End Date Taking? Authorizing Provider  acetaminophen (TYLENOL) 325 MG tablet Take 650 mg by mouth every 4 (four) hours as needed for pain or fever.    Historical Provider, MD  baclofen (LIORESAL) 10 MG tablet Take 10 mg by mouth 4 (four) times daily.     Historical Provider, MD  benzonatate  (TESSALON) 100 MG capsule Take 1 capsule (100 mg total) by mouth 3 (three) times daily as needed for cough. 10/10/14   Meredeth IdeGagan S Lama, MD  bisacodyl (DULCOLAX) 10 MG suppository Place 10 mg rectally daily as needed for constipation.    Historical Provider, MD  diphenhydrAMINE (BENADRYL) 25 MG tablet Take 25 mg by mouth every 4 (four) hours as needed for itching.    Historical Provider, MD  feeding supplement, ENSURE ENLIVE, (ENSURE ENLIVE) LIQD Take 237 mLs by mouth 2 (two) times daily between meals. 10/10/14   Meredeth IdeGagan S Lama, MD  fentaNYL (DURAGESIC - DOSED MCG/HR) 50 MCG/HR Place 1 patch (50 mcg total) onto the skin every 3 (three) days. APPLY WITH 75 MCG PATCH FOR A TOTAL OF 125 MCG 10/10/14   Meredeth IdeGagan S Lama, MD  fentaNYL (DURAGESIC - DOSED MCG/HR) 75 MCG/HR Place 1 patch (75 mcg total) onto the skin every 3 (three) days. APPLY WITH 50 MCG PATCH FOR A TOTAL OF 125 MCG 10/10/14   Meredeth IdeGagan S Lama, MD  ipratropium-albuterol (DUONEB) 0.5-2.5 (3) MG/3ML SOLN Take 3 mLs by nebulization 3 (three) times daily. 10/10/14   Meredeth IdeGagan S Lama, MD  Linaclotide (LINZESS) 145 MCG CAPS capsule Take 145 mcg by mouth daily with breakfast.    Historical Provider, MD  loratadine (CLARITIN) 10 MG tablet Take 10 mg by mouth daily.    Historical Provider, MD  LORazepam (ATIVAN) 0.5 MG tablet Take 0.5 mg by mouth every 6 (six)  hours as needed for anxiety.    Historical Provider, MD  oxybutynin (DITROPAN) 5 MG tablet Take 5 mg by mouth 4 (four) times daily.    Historical Provider, MD  oxyCODONE (ROXICODONE) 15 MG immediate release tablet Take 1 tablet (15 mg total) by mouth every 4 (four) hours as needed for pain. 10/10/14   Meredeth IdeGagan S Lama, MD  polyethylene glycol (MIRALAX / GLYCOLAX) packet Take 17 g by mouth daily as needed for mild constipation. Takes scheduled every day, may also have 1 more packet a day if needed    Historical Provider, MD  potassium chloride (K-DUR,KLOR-CON) 10 MEQ tablet Take 10 mEq by mouth 2 (two) times daily.     Historical Provider, MD  pregabalin (LYRICA) 100 MG capsule Take 100 mg by mouth 2 (two) times daily.    Historical Provider, MD  pseudoephedrine (SUDAFED) 60 MG tablet Take 60 mg by mouth every 6 (six) hours as needed for congestion.    Historical Provider, MD  rOPINIRole (REQUIP) 0.5 MG tablet Take 0.5 mg by mouth at bedtime.    Historical Provider, MD  sodium phosphate (FLEET) enema Place 1 enema rectally as needed (for constipation). follow package directions    Historical Provider, MD   BP 108/61 mmHg  Pulse 58  Temp(Src) 98.3 F (36.8 C) (Oral)  Resp 18  Ht 6\' 4"  (1.93 m)  Wt 175 lb (79.379 kg)  BMI 21.31 kg/m2  SpO2 100% Physical Exam  Constitutional: He appears well-developed.  HENT:  Head: Normocephalic.  Neck: Neck supple.  Cardiovascular: Normal rate and regular rhythm.   Pulmonary/Chest: Effort normal. He has no rales.  Abdominal: He exhibits distension.  Distention with somewhat diffuse tenderness. Midline surgical scar.  Musculoskeletal:  Some wasting of all extremities. Has some slight movement of his upper extremities.  Neurological: He is alert.  Patient is a C6 quadriplegic.  Skin: Skin is warm.    ED Course  Procedures (including critical care time) Labs Review Labs Reviewed  COMPREHENSIVE METABOLIC PANEL  LIPASE, BLOOD  URINALYSIS, ROUTINE W REFLEX MICROSCOPIC  CBC WITH DIFFERENTIAL/PLATELET    Imaging Review No results found.   EKG Interpretation None      MDM   Final diagnoses:  Abdominal pain, unspecified abdominal location    Patient with abdominal pain. Will get CT scan.   Benjiman CoreNathan Kema Santaella, MD 11/02/14 [phone]

## 2014-11-02 NOTE — ED Notes (Signed)
Per ems, pt txfr here from nursing home for abdominal pain x 2 weeks.

## 2014-11-04 LAB — URINE CULTURE

## 2015-01-06 ENCOUNTER — Encounter (HOSPITAL_COMMUNITY): Payer: Self-pay | Admitting: Emergency Medicine

## 2015-01-06 ENCOUNTER — Emergency Department (HOSPITAL_COMMUNITY)
Admission: EM | Admit: 2015-01-06 | Discharge: 2015-01-06 | Disposition: A | Discharge status: Home / Self Care | Payer: Medicare Other | Attending: Emergency Medicine | Admitting: Emergency Medicine

## 2015-01-06 DIAGNOSIS — Z720 Tobacco use: Secondary | ICD-10-CM | POA: Diagnosis not present

## 2015-01-06 DIAGNOSIS — Z880 Allergy status to penicillin: Secondary | ICD-10-CM | POA: Insufficient documentation

## 2015-01-06 DIAGNOSIS — Z79899 Other long term (current) drug therapy: Secondary | ICD-10-CM | POA: Diagnosis not present

## 2015-01-06 DIAGNOSIS — T83098A Other mechanical complication of other indwelling urethral catheter, initial encounter: Secondary | ICD-10-CM | POA: Diagnosis not present

## 2015-01-06 DIAGNOSIS — Y846 Urinary catheterization as the cause of abnormal reaction of the patient, or of later complication, without mention of misadventure at the time of the procedure: Secondary | ICD-10-CM | POA: Insufficient documentation

## 2015-01-06 DIAGNOSIS — Z8739 Personal history of other diseases of the musculoskeletal system and connective tissue: Secondary | ICD-10-CM | POA: Diagnosis not present

## 2015-01-06 DIAGNOSIS — Z8669 Personal history of other diseases of the nervous system and sense organs: Secondary | ICD-10-CM | POA: Insufficient documentation

## 2015-01-06 DIAGNOSIS — Z79891 Long term (current) use of opiate analgesic: Secondary | ICD-10-CM | POA: Diagnosis not present

## 2015-01-06 DIAGNOSIS — R140 Abdominal distension (gaseous): Secondary | ICD-10-CM | POA: Insufficient documentation

## 2015-01-06 DIAGNOSIS — T83010A Breakdown (mechanical) of cystostomy catheter, initial encounter: Secondary | ICD-10-CM

## 2015-01-06 LAB — BASIC METABOLIC PANEL
ANION GAP: 6 (ref 5–15)
BUN: 11 mg/dL (ref 6–20)
CO2: 25 mmol/L (ref 22–32)
Calcium: 8.5 mg/dL — ABNORMAL LOW (ref 8.9–10.3)
Chloride: 107 mmol/L (ref 101–111)
Creatinine, Ser: 0.71 mg/dL (ref 0.61–1.24)
GFR calc non Af Amer: >60 mL/min (ref >60)
Glucose, Bld: 67 mg/dL (ref 65–99)
POTASSIUM: 3.5 mmol/L (ref 3.5–5.1)
SODIUM: 138 mmol/L (ref 135–145)

## 2015-01-06 LAB — CBC
HCT: 37.9 % — ABNORMAL LOW (ref 39.0–52.0)
Hemoglobin: 12.4 g/dL — ABNORMAL LOW (ref 13.0–17.0)
MCH: 27.6 pg (ref 26.0–34.0)
MCHC: 32.7 g/dL (ref 30.0–36.0)
MCV: 84.2 fL (ref 78.0–100.0)
Platelets: 133 10*3/uL — ABNORMAL LOW (ref 150–400)
RBC: 4.5 MIL/uL (ref 4.22–5.81)
RDW: 15.6 % — AB (ref 11.5–15.5)
WBC: 6.4 10*3/uL (ref 4.0–10.5)

## 2015-01-06 NOTE — ED Notes (Signed)
MD at bedside. 

## 2015-01-06 NOTE — ED Provider Notes (Signed)
CSN: [id number]     Arrival date & time 01/06/15  0449 History   First MD Initiated Contact with Patient 01/06/15 0554     Chief Complaint  Patient presents with  . Suprapubic catheter problem      (Consider location/radiation/quality/duration/timing/severity/associated sxs/prior Treatment) HPI Comments: Pt comes in with suprapubic catheter problems. The catheter got clogged, and 2 hours ago it was removed. Pt has some abdominal distention. No other complains.   The history is provided by the patient.    Past Medical History  Diagnosis Date  . Immobile, syndrome paraplegic   . 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 Hip arthrotomy Right   . Small intestine surgery     Family History  Problem Relation Age of Onset  . Sinusitis Sister    History  Substance Use Topics  . Smoking status: Current Every Day Smoker -- 0.50 packs/day    Types: Cigarettes  . Smokeless tobacco: Not on file  . Alcohol Use: No    Review of Systems  Constitutional: Negative for activity change and appetite change.  Respiratory: Negative for cough and shortness of breath.   Cardiovascular: Negative for chest pain.  Gastrointestinal: Positive for abdominal distention. Negative for abdominal pain.      Allergies  Levaquin; Penicillins; and Vancomycin  Home Medications   Prior to Admission medications   Medication Sig Start Date End Date Taking? Authorizing Provider  acetaminophen (TYLENOL) 325 MG tablet Take 650 mg by mouth every 4 (four) hours as needed for pain or fever.   Yes Historical Provider, MD  baclofen (LIORESAL) 10 MG tablet Take 10 mg by mouth 4 (four) times daily.    Yes Historical Provider, MD  bisacodyl (DULCOLAX) 10 MG suppository Place 10 mg rectally daily as needed for moderate constipation.   Yes Historical Provider, MD  diphenhydrAMINE (BENADRYL) 25 MG tablet Take 25 mg by mouth every 4 (four) hours as needed for itching.   Yes  Historical Provider, MD  fentaNYL (DURAGESIC - DOSED MCG/HR) 50 MCG/HR Place 1 patch (50 mcg total) onto the skin every 3 (three) days. APPLY WITH 75 MCG PATCH FOR A TOTAL OF 125 MCG 10/10/14  Yes Meredeth Ide, MD  fentaNYL (DURAGESIC - DOSED MCG/HR) 75 MCG/HR Place 1 patch (75 mcg total) onto the skin every 3 (three) days. APPLY WITH 50 MCG PATCH FOR A TOTAL OF 125 MCG 10/10/14  Yes Meredeth Ide, MD  ipratropium-albuterol (DUONEB) 0.5-2.5 (3) MG/3ML SOLN Take 3 mLs by nebulization 3 (three) times daily. Patient taking differently: Take 3 mLs by nebulization every 6 (six) hours as needed (for shortness of breath).  10/10/14  Yes Meredeth Ide, MD  Linaclotide (LINZESS) 145 MCG CAPS capsule Take 145 mcg by mouth daily with breakfast.   Yes Historical Provider, MD  loratadine (CLARITIN) 10 MG tablet Take 10 mg by mouth daily.   Yes Historical Provider, MD  LORazepam (ATIVAN) 0.5 MG tablet Take 0.5 mg by mouth every 6 (six) hours as needed for anxiety.   Yes Historical Provider, MD  naloxegol oxalate (MOVANTIK) 25 MG TABS tablet Take 25 mg by mouth daily.   Yes Historical Provider, MD  oxybutynin (DITROPAN) 5 MG tablet Take 5 mg by mouth 4 (four) times daily.   Yes Historical Provider, MD  oxyCODONE (ROXICODONE) 15 MG immediate release tablet Take 1 tablet (15 mg total) by mouth every 4 (four) hours as needed for pain. 10/10/14  Yes Sarina Ill  Lama, MD  polyethylene glycol (MIRALAX / GLYCOLAX) packet Take 17 g by mouth daily. Takes scheduled every day, may also have 1 more packet a day if needed   Yes Historical Provider, MD  potassium chloride (K-DUR,KLOR-CON) 10 MEQ tablet Take 10 mEq by mouth 2 (two) times daily.   Yes Historical Provider, MD  pregabalin (LYRICA) 100 MG capsule Take 100 mg by mouth 2 (two) times daily.   Yes Historical Provider, MD  pseudoephedrine (SUDAFED) 60 MG tablet Take 60 mg by mouth every 6 (six) hours as needed for congestion.   Yes Historical Provider, MD  rOPINIRole (REQUIP) 0.5 MG  tablet Take 0.5 mg by mouth at bedtime.   Yes Historical Provider, MD  simethicone (MYLICON) 125 MG chewable tablet Chew 125 mg by mouth every 6 (six) hours as needed for flatulence.   Yes Historical Provider, MD  tiZANidine (ZANAFLEX) 4 MG tablet Take 4 mg by mouth every 12 (twelve) hours as needed for muscle spasms.   Yes Historical Provider, MD   BP 107/83 mmHg  Pulse 50  Temp(Src) 98.1 F (36.7 C) (Oral)  Resp 18  SpO2 97% Physical Exam  Constitutional: He appears well-developed.  HENT:  Head: Normocephalic and atraumatic.  Eyes: Conjunctivae and EOM are normal. Pupils are equal, round, and reactive to light.  Neck: Normal range of motion. Neck supple.  Cardiovascular: Normal rate and regular rhythm.   Pulmonary/Chest: Effort normal and breath sounds normal.  Abdominal: Soft. Bowel sounds are normal. He exhibits distension. There is no tenderness.  Neurological: He is alert.  Skin: Skin is warm.  Nursing note and vitals reviewed.   ED Course  SUPRAPUBIC TUBE PLACEMENT Date/Time: 01/06/2015 7:10 AM Performed by: Derwood Kaplan Authorized by: Derwood Kaplan Consent: Verbal consent obtained. Risks and benefits: risks, benefits and alternatives were discussed Consent given by: patient Patient understanding: patient states understanding of the procedure being performed Site marked: the operative site was marked Required items: required blood products, implants, devices, and special equipment available Patient identity confirmed: verbally with patient Time out: Immediately prior to procedure a "time out" was called to verify the correct patient, procedure, equipment, support staff and site/side marked as required. Indications: neurogenic bladder Local anesthesia used: no Patient sedated: no Preparation: Patient was prepped and draped in the usual sterile fashion. Suprapubic aspiration by: catheter Catheter type: Foley Catheter size: 22 Fr Number of attempts: 1 Urine  characteristics: yellow Patient tolerance: Patient tolerated the procedure well with no immediate complications   (including critical care time) Labs Review Labs Reviewed  CBC - Abnormal; Notable for the following:    Hemoglobin 12.4 (*)    HCT 37.9 (*)    RDW 15.6 (*)    Platelets 133 (*)    All other components within normal limits  BASIC METABOLIC PANEL - Abnormal; Notable for the following:    Calcium 8.5 (*)    All other components within normal limits    Imaging Review No results found.   EKG Interpretation None      MDM   Final diagnoses:  Suprapubic catheter dysfunction, initial encounter   Pt's suprapubic catheter was replaced with 22 G urinary foley catheter.  Pt will see his Urologist soon, he is to see them in 2 weeks out at [place], but will try to see them this week.  Derwood Kaplan, MD 01/06/15 [phone]

## 2015-01-06 NOTE — ED Notes (Signed)
Pt. Left with all belongings. RN attempted report to [employer]. RN gave report to [facility]

## 2015-01-06 NOTE — ED Notes (Signed)
Report attempted, but no answer at facility.

## 2015-01-06 NOTE — ED Notes (Signed)
Per the pt, his suprapubic catheter was clogged, and eventually removed by nursing home staff. Pt in need of replacement catheter.

## 2015-01-06 NOTE — Discharge Instructions (Signed)
Suprapubic Catheter Replacement   A suprapubic catheter is a rubber tube with a tiny balloon on the end. It is used to drain urine from the bladder. This catheter is put in your bladder through a small opening in the lower center part of your abdomen. Suprapubic refers to the area right above your pubic bone.   The balloon on the end of the catheter is filled with germ-free (sterile) water. This keeps the catheter from slipping out. When the catheter is in place, your urine will drain into a collection bag. The bag can be put beside your bed at night or attached to your leg during the day. Sometimes, a caregiver will change your suprapubic catheter. Other times, you may need to change it yourself. This may be the case if you need to wear a catheter for a long time. Usually, they need to be changed every 4 to 6 weeks. Ask your caregiver how often yours should be changed.   BEFORE THE PROCEDURE   Your caregiver will help you order the following supplies you will need:   Sterile gloves.   Catheters.   Syringes.   Sterile water.   Sterile cleaning solution.   Lubricant.   Drainage bags.  PROCEDURE   To change your catheter:   Drink plenty of fluids before changing the catheter.   Wash your hands with soap and water.   Lie on your back and put on sterile gloves.   Clean the skin around the catheter opening. Use the sterile cleaning solution.   Use a syringe to get the water out of the balloon from the old catheter.   Slowly remove the catheter.   Take off the first pair of gloves, and put on a new pair. Then put lubricant on the tip of the new catheter. Put the new catheter through the opening.   Wait for some urine to start flowing. Then, use the other syringe to fill the balloon with sterile water.   Attach the catheter to your drainage bag. Make sure the connection is tight.  Important warnings   The catheter should come out easily. If it seems stuck, do not pull it.   Call your caregiver right away if you have any  trouble while changing the catheter.   When the old catheter is removed, the new one should be put in right away. This is because the opening will close quickly. If you have a problem, go to an emergency clinic right away.  AFTER THE PROCEDURE   Understand how to care for the catheter site and change any bandages.   Understand how to use and clean the drainage bags.  Document Released: 02/25/2001 Document Revised: 08/25/2011 Document Reviewed: 01/17/2011   [employer]® Patient Information ©2015 [employer]. This information is not intended to replace advice given to you by your health care provider. Make sure you discuss any questions you have with your health care provider.

## 2015-04-24 ENCOUNTER — Emergency Department (HOSPITAL_COMMUNITY): Payer: Medicare Other

## 2015-04-24 ENCOUNTER — Encounter (HOSPITAL_COMMUNITY): Payer: Self-pay | Admitting: Internal Medicine

## 2015-04-24 ENCOUNTER — Inpatient Hospital Stay (HOSPITAL_COMMUNITY)
Admission: EM | Admit: 2015-04-24 | Discharge: 2015-04-27 | DRG: 698 | Disposition: A | Discharge status: Skilled Nursing Facility | Payer: Medicare Other | Attending: Internal Medicine | Admitting: Internal Medicine

## 2015-04-24 DIAGNOSIS — G8220 Paraplegia, unspecified: Secondary | ICD-10-CM | POA: Diagnosis present

## 2015-04-24 DIAGNOSIS — N390 Urinary tract infection, site not specified: Secondary | ICD-10-CM | POA: Diagnosis present

## 2015-04-24 DIAGNOSIS — J9811 Atelectasis: Secondary | ICD-10-CM | POA: Diagnosis present

## 2015-04-24 DIAGNOSIS — N132 Hydronephrosis with renal and ureteral calculous obstruction: Secondary | ICD-10-CM | POA: Diagnosis present

## 2015-04-24 DIAGNOSIS — D696 Thrombocytopenia, unspecified: Secondary | ICD-10-CM | POA: Diagnosis present

## 2015-04-24 DIAGNOSIS — K529 Noninfective gastroenteritis and colitis, unspecified: Secondary | ICD-10-CM | POA: Diagnosis not present

## 2015-04-24 DIAGNOSIS — L89153 Pressure ulcer of sacral region, stage 3: Secondary | ICD-10-CM | POA: Diagnosis present

## 2015-04-24 DIAGNOSIS — N319 Neuromuscular dysfunction of bladder, unspecified: Secondary | ICD-10-CM | POA: Diagnosis present

## 2015-04-24 DIAGNOSIS — D649 Anemia, unspecified: Secondary | ICD-10-CM | POA: Diagnosis present

## 2015-04-24 DIAGNOSIS — Z880 Allergy status to penicillin: Secondary | ICD-10-CM

## 2015-04-24 DIAGNOSIS — E876 Hypokalemia: Secondary | ICD-10-CM | POA: Diagnosis present

## 2015-04-24 DIAGNOSIS — R05 Cough: Secondary | ICD-10-CM | POA: Diagnosis present

## 2015-04-24 DIAGNOSIS — Y95 Nosocomial condition: Secondary | ICD-10-CM | POA: Diagnosis present

## 2015-04-24 DIAGNOSIS — Y731 Therapeutic (nonsurgical) and rehabilitative gastroenterology and urology devices associated with adverse incidents: Secondary | ICD-10-CM | POA: Diagnosis present

## 2015-04-24 DIAGNOSIS — Z881 Allergy status to other antibiotic agents status: Secondary | ICD-10-CM

## 2015-04-24 DIAGNOSIS — G8929 Other chronic pain: Secondary | ICD-10-CM | POA: Diagnosis not present

## 2015-04-24 DIAGNOSIS — L89159 Pressure ulcer of sacral region, unspecified stage: Secondary | ICD-10-CM | POA: Diagnosis present

## 2015-04-24 DIAGNOSIS — R509 Fever, unspecified: Secondary | ICD-10-CM | POA: Diagnosis not present

## 2015-04-24 DIAGNOSIS — Z993 Dependence on wheelchair: Secondary | ICD-10-CM | POA: Diagnosis not present

## 2015-04-24 DIAGNOSIS — J189 Pneumonia, unspecified organism: Secondary | ICD-10-CM | POA: Diagnosis present

## 2015-04-24 DIAGNOSIS — R609 Edema, unspecified: Secondary | ICD-10-CM

## 2015-04-24 DIAGNOSIS — Y92009 Unspecified place in unspecified non-institutional (private) residence as the place of occurrence of the external cause: Secondary | ICD-10-CM

## 2015-04-24 DIAGNOSIS — R059 Cough, unspecified: Secondary | ICD-10-CM | POA: Diagnosis present

## 2015-04-24 DIAGNOSIS — K5641 Fecal impaction: Secondary | ICD-10-CM | POA: Diagnosis present

## 2015-04-24 DIAGNOSIS — L89319 Pressure ulcer of right buttock, unspecified stage: Secondary | ICD-10-CM | POA: Diagnosis present

## 2015-04-24 DIAGNOSIS — T83518A Infection and inflammatory reaction due to other urinary catheter, initial encounter: Secondary | ICD-10-CM | POA: Diagnosis present

## 2015-04-24 DIAGNOSIS — Z09 Encounter for follow-up examination after completed treatment for conditions other than malignant neoplasm: Secondary | ICD-10-CM

## 2015-04-24 DIAGNOSIS — B964 Proteus (mirabilis) (morganii) as the cause of diseases classified elsewhere: Secondary | ICD-10-CM | POA: Diagnosis present

## 2015-04-24 DIAGNOSIS — G894 Chronic pain syndrome: Secondary | ICD-10-CM | POA: Diagnosis present

## 2015-04-24 DIAGNOSIS — F17210 Nicotine dependence, cigarettes, uncomplicated: Secondary | ICD-10-CM | POA: Diagnosis present

## 2015-04-24 DIAGNOSIS — N1330 Unspecified hydronephrosis: Secondary | ICD-10-CM | POA: Diagnosis present

## 2015-04-24 HISTORY — DX: Pneumonia due to Staphylococcus, unspecified: J15.20

## 2015-04-24 HISTORY — DX: Heparin induced thrombocytopenia (HIT): D75.82

## 2015-04-24 HISTORY — DX: Unspecified psychosis not due to a substance or known physiological condition: F29

## 2015-04-24 HISTORY — DX: Other pancytopenia: D61.818

## 2015-04-24 HISTORY — DX: Heparin-induced thrombocytopenia, unspecified: D75.829

## 2015-04-24 HISTORY — DX: Pressure ulcer of sacral region, unspecified stage: L89.159

## 2015-04-24 HISTORY — DX: Osteomyelitis, unspecified: M86.9

## 2015-04-24 HISTORY — DX: Neuromuscular dysfunction of bladder, unspecified: N31.9

## 2015-04-24 LAB — CBC
HEMATOCRIT: 35.1 % — AB (ref 39.0–52.0)
Hemoglobin: 12.1 g/dL — ABNORMAL LOW (ref 13.0–17.0)
MCH: 27.8 pg (ref 26.0–34.0)
MCHC: 34.5 g/dL (ref 30.0–36.0)
MCV: 80.7 fL (ref 78.0–100.0)
PLATELETS: 71 10*3/uL — AB (ref 150–400)
RBC: 4.35 MIL/uL (ref 4.22–5.81)
RDW: 15 % (ref 11.5–15.5)
WBC: 7.2 10*3/uL (ref 4.0–10.5)

## 2015-04-24 LAB — URINE MICROSCOPIC-ADD ON

## 2015-04-24 LAB — URINALYSIS, ROUTINE W REFLEX MICROSCOPIC
Bilirubin Urine: NEGATIVE
GLUCOSE, UA: NEGATIVE mg/dL
KETONES UR: NEGATIVE mg/dL
Nitrite: NEGATIVE
PH: 8.5 — AB (ref 5.0–8.0)
Protein, ur: 30 mg/dL — AB
Specific Gravity, Urine: 1.009 (ref 1.005–1.030)
Urobilinogen, UA: 1 mg/dL (ref 0.0–1.0)

## 2015-04-24 LAB — COMPREHENSIVE METABOLIC PANEL
ALT: 12 U/L — AB (ref 17–63)
AST: 15 U/L (ref 15–41)
Albumin: 3.1 g/dL — ABNORMAL LOW (ref 3.5–5.0)
Alkaline Phosphatase: 82 U/L (ref 38–126)
Anion gap: 10 (ref 5–15)
BUN: 13 mg/dL (ref 6–20)
CHLORIDE: 108 mmol/L (ref 101–111)
CO2: 19 mmol/L — AB (ref 22–32)
CREATININE: 0.89 mg/dL (ref 0.61–1.24)
Calcium: 8.5 mg/dL — ABNORMAL LOW (ref 8.9–10.3)
GFR calc Af Amer: >60 mL/min (ref >60)
GFR calc non Af Amer: >60 mL/min (ref >60)
Glucose, Bld: 119 mg/dL — ABNORMAL HIGH (ref 65–99)
POTASSIUM: 2.7 mmol/L — AB (ref 3.5–5.1)
SODIUM: 137 mmol/L (ref 135–145)
Total Bilirubin: 1.9 mg/dL — ABNORMAL HIGH (ref 0.3–1.2)
Total Protein: 7.4 g/dL (ref 6.5–8.1)

## 2015-04-24 LAB — I-STAT TROPONIN, ED: TROPONIN I, POC: 0 ng/mL (ref 0.00–0.08)

## 2015-04-24 LAB — I-STAT CG4 LACTIC ACID, ED: Lactic Acid, Venous: 1.15 mmol/L (ref 0.5–2.0)

## 2015-04-24 LAB — LIPASE, BLOOD: LIPASE: 26 U/L (ref 11–51)

## 2015-04-24 LAB — MAGNESIUM: Magnesium: 1.3 mg/dL — ABNORMAL LOW (ref 1.7–2.4)

## 2015-04-24 MED ORDER — METRONIDAZOLE IN NACL 5-0.79 MG/ML-% IV SOLN
500.0000 mg | Freq: Three times a day (TID) | INTRAVENOUS | Status: DC
  Administered 2015-04-24 – 2015-04-25 (×3): 500 mg via INTRAVENOUS
  Filled 2015-04-24 (×3): qty 100

## 2015-04-24 MED ORDER — BISACODYL 10 MG RE SUPP: 10.0000 mg | Freq: Every day | RECTAL | Status: DC | PRN

## 2015-04-24 MED ORDER — NALOXEGOL OXALATE 25 MG PO TABS
25.0000 mg | ORAL_TABLET | Freq: Every day | ORAL | Status: DC
  Administered 2015-04-25 – 2015-04-27 (×3): 25 mg via ORAL
  Filled 2015-04-24 (×4): qty 1

## 2015-04-24 MED ORDER — MORPHINE SULFATE (PF) 4 MG/ML IV SOLN: 4.0000 mg | INTRAVENOUS | Status: DC | PRN

## 2015-04-24 MED ORDER — CIPROFLOXACIN IN D5W 400 MG/200ML IV SOLN
400.0000 mg | Freq: Two times a day (BID) | INTRAVENOUS | Status: DC
  Administered 2015-04-24 – 2015-04-25 (×2): 400 mg via INTRAVENOUS
  Filled 2015-04-24 (×2): qty 200

## 2015-04-24 MED ORDER — GUAIFENESIN-DM 100-10 MG/5ML PO SYRP: 5.0000 mL | ORAL_SOLUTION | ORAL | Status: DC | PRN

## 2015-04-24 MED ORDER — SODIUM CHLORIDE 0.9 % IJ SOLN
3.0000 mL | Freq: Two times a day (BID) | INTRAMUSCULAR | Status: DC
Start: 2015-04-24 — End: 2015-04-27
  Administered 2015-04-25 – 2015-04-26 (×2): 3 mL via INTRAVENOUS

## 2015-04-24 MED ORDER — IPRATROPIUM-ALBUTEROL 0.5-2.5 (3) MG/3ML IN SOLN: 3.0000 mL | Freq: Four times a day (QID) | RESPIRATORY_TRACT | Status: DC | PRN

## 2015-04-24 MED ORDER — POLYETHYLENE GLYCOL 3350 17 G PO PACK
17.0000 g | PACK | Freq: Two times a day (BID) | ORAL | Status: DC
  Administered 2015-04-24 – 2015-04-27 (×2): 17 g via ORAL
  Filled 2015-04-24 (×5): qty 1

## 2015-04-24 MED ORDER — ACETAMINOPHEN 650 MG RE SUPP: 650.0000 mg | Freq: Four times a day (QID) | RECTAL | Status: DC | PRN

## 2015-04-24 MED ORDER — ROPINIROLE HCL 0.5 MG PO TABS
0.5000 mg | ORAL_TABLET | Freq: Every day | ORAL | Status: DC
  Administered 2015-04-24 – 2015-04-26 (×3): 0.5 mg via ORAL
  Filled 2015-04-24 (×4): qty 1

## 2015-04-24 MED ORDER — POTASSIUM CHLORIDE IN NACL 40-0.9 MEQ/L-% IV SOLN
INTRAVENOUS | Status: DC
Start: 2015-04-24 — End: 2015-04-27
  Administered 2015-04-24 – 2015-04-27 (×5): 100 mL/h via INTRAVENOUS
  Filled 2015-04-24 (×8): qty 1000

## 2015-04-24 MED ORDER — PREGABALIN 50 MG PO CAPS
100.0000 mg | ORAL_CAPSULE | Freq: Two times a day (BID) | ORAL | Status: DC
  Administered 2015-04-24 – 2015-04-27 (×6): 100 mg via ORAL
  Filled 2015-04-24 (×6): qty 2

## 2015-04-24 MED ORDER — FENTANYL 75 MCG/HR TD PT72
75.0000 ug | MEDICATED_PATCH | TRANSDERMAL | Status: DC
  Administered 2015-04-26: 75 ug via TRANSDERMAL

## 2015-04-24 MED ORDER — IOHEXOL 300 MG/ML  SOLN
100.0000 mL | Freq: Once | INTRAMUSCULAR | Status: AC | PRN
  Administered 2015-04-24: 100 mL via INTRAVENOUS

## 2015-04-24 MED ORDER — ACETAMINOPHEN 325 MG PO TABS
650.0000 mg | ORAL_TABLET | Freq: Four times a day (QID) | ORAL | Status: DC | PRN
  Administered 2015-04-25: 650 mg via ORAL
  Filled 2015-04-24: qty 2

## 2015-04-24 MED ORDER — GUAIFENESIN ER 600 MG PO TB12
600.0000 mg | ORAL_TABLET | Freq: Two times a day (BID) | ORAL | Status: DC
  Administered 2015-04-24 – 2015-04-27 (×6): 600 mg via ORAL
  Filled 2015-04-24 (×6): qty 1

## 2015-04-24 MED ORDER — ONDANSETRON HCL 4 MG/2ML IJ SOLN
4.0000 mg | Freq: Once | INTRAMUSCULAR | Status: AC
  Administered 2015-04-24: 4 mg via INTRAVENOUS
  Filled 2015-04-24: qty 2

## 2015-04-24 MED ORDER — HYDROMORPHONE HCL 1 MG/ML IJ SOLN: 1.0000 mg | INTRAMUSCULAR | Status: DC | PRN

## 2015-04-24 MED ORDER — LORAZEPAM 0.5 MG PO TABS
0.5000 mg | ORAL_TABLET | Freq: Four times a day (QID) | ORAL | Status: DC | PRN
  Administered 2015-04-25: 0.5 mg via ORAL
  Filled 2015-04-24: qty 1

## 2015-04-24 MED ORDER — ACETAMINOPHEN 325 MG PO TABS
650.0000 mg | ORAL_TABLET | Freq: Once | ORAL | Status: AC
  Administered 2015-04-24: 650 mg via ORAL
  Filled 2015-04-24: qty 2

## 2015-04-24 MED ORDER — METRONIDAZOLE IN NACL 5-0.79 MG/ML-% IV SOLN: 500.0000 mg | Freq: Once | INTRAVENOUS | Status: DC

## 2015-04-24 MED ORDER — HYDROMORPHONE HCL 1 MG/ML IJ SOLN
1.0000 mg | Freq: Once | INTRAMUSCULAR | Status: AC
  Administered 2015-04-24: 1 mg via INTRAVENOUS
  Filled 2015-04-24: qty 1

## 2015-04-24 MED ORDER — ONDANSETRON HCL 4 MG/2ML IJ SOLN: 4.0000 mg | Freq: Four times a day (QID) | INTRAMUSCULAR | Status: DC | PRN

## 2015-04-24 MED ORDER — BACLOFEN 10 MG PO TABS
10.0000 mg | ORAL_TABLET | Freq: Four times a day (QID) | ORAL | Status: DC
  Administered 2015-04-24 – 2015-04-27 (×11): 10 mg via ORAL
  Filled 2015-04-24 (×12): qty 1

## 2015-04-24 MED ORDER — OXYBUTYNIN CHLORIDE 5 MG PO TABS
5.0000 mg | ORAL_TABLET | Freq: Four times a day (QID) | ORAL | Status: DC
  Administered 2015-04-24 – 2015-04-27 (×11): 5 mg via ORAL
  Filled 2015-04-24 (×11): qty 1

## 2015-04-24 MED ORDER — OXYCODONE HCL 5 MG PO TABS
15.0000 mg | ORAL_TABLET | ORAL | Status: DC | PRN
  Administered 2015-04-24 – 2015-04-27 (×14): 15 mg via ORAL
  Filled 2015-04-24 (×14): qty 3

## 2015-04-24 MED ORDER — LORATADINE 10 MG PO TABS
10.0000 mg | ORAL_TABLET | Freq: Every day | ORAL | Status: DC
  Administered 2015-04-25 – 2015-04-27 (×3): 10 mg via ORAL
  Filled 2015-04-24 (×3): qty 1

## 2015-04-24 MED ORDER — SIMETHICONE 80 MG PO CHEW
80.0000 mg | CHEWABLE_TABLET | Freq: Four times a day (QID) | ORAL | Status: DC
  Administered 2015-04-24 – 2015-04-27 (×9): 80 mg via ORAL
  Filled 2015-04-24 (×11): qty 1

## 2015-04-24 MED ORDER — SODIUM CHLORIDE 0.9 % IV BOLUS (SEPSIS)
1000.0000 mL | Freq: Once | INTRAVENOUS | Status: AC
  Administered 2015-04-24: 1000 mL via INTRAVENOUS

## 2015-04-24 MED ORDER — LINACLOTIDE 145 MCG PO CAPS
145.0000 ug | ORAL_CAPSULE | Freq: Every day | ORAL | Status: DC
  Administered 2015-04-25 – 2015-04-27 (×3): 145 ug via ORAL
  Filled 2015-04-24 (×5): qty 1

## 2015-04-24 MED ORDER — POTASSIUM CHLORIDE CRYS ER 20 MEQ PO TBCR
20.0000 meq | EXTENDED_RELEASE_TABLET | Freq: Three times a day (TID) | ORAL | Status: DC
  Administered 2015-04-24 – 2015-04-27 (×7): 20 meq via ORAL
  Filled 2015-04-24 (×8): qty 1

## 2015-04-24 MED ORDER — TIZANIDINE HCL 4 MG PO TABS
4.0000 mg | ORAL_TABLET | Freq: Two times a day (BID) | ORAL | Status: DC | PRN
  Filled 2015-04-24: qty 1

## 2015-04-24 MED ORDER — POTASSIUM CHLORIDE 10 MEQ/100ML IV SOLN
10.0000 meq | INTRAVENOUS | Status: AC
Start: 2015-04-24 — End: 2015-04-24
  Administered 2015-04-24 (×3): 10 meq via INTRAVENOUS
  Filled 2015-04-24 (×3): qty 100

## 2015-04-24 MED ORDER — MAGNESIUM CITRATE PO SOLN: 1.0000 | Freq: Once | ORAL | Status: DC | PRN

## 2015-04-24 MED ORDER — ONDANSETRON HCL 4 MG PO TABS: 4.0000 mg | ORAL_TABLET | Freq: Four times a day (QID) | ORAL | Status: DC | PRN

## 2015-04-24 MED ORDER — CIPROFLOXACIN IN D5W 400 MG/200ML IV SOLN: 400.0000 mg | Freq: Once | INTRAVENOUS | Status: DC

## 2015-04-24 NOTE — ED Provider Notes (Signed)
CSN: [id number]     Arrival date & time 04/24/15  1114 History   First MD Initiated Contact with Patient 04/24/15 1140     Chief Complaint  Patient presents with  . Abdominal Pain     (Consider location/radiation/quality/duration/timing/severity/associated sxs/prior Treatment) HPI Comments: Patient is a 54 year old male with a history of paraplegia who is wheelchair bound and lives in a facility presenting today with a three-day history of cough, congestion starting the last 3 days and then vomiting starting yesterday. He has not been able to hold down anything by mouth today and continues to vomit. Also today he developed left lower quadrant pain around his suprapubic catheter site. He also notes change in the color of his urine in the last 3 days. He denies fever. His suprapubic catheter is usually changed every 30 days it was last changed near the end of October. He is concerned that he may have pneumonia versus urinary tract infection. He currently is not taking any antibiotics. He does have all patch which is changed yesterday but he is requesting that it be removed because it's making his leg burn. He has not taken any oral oxycodone today. He does have decubitus ulcers on his buttocks but denies any change or worsening of the ulcer. No heel ulcers or any other decubitus ulcers present. Patient states that his bowel movements have been normal and his last normal bowel movement was this morning  Patient is a 54 y.o. male presenting with abdominal pain. The history is provided by the patient.  Abdominal Pain Pain location:  LLQ Pain quality: sharp, shooting and stabbing   Pain radiates to:  Does not radiate Pain severity:  Moderate Onset quality:  Sudden Duration:  1 day Timing:  Constant Progression:  Unchanged Chronicity:  New   Past Medical History  Diagnosis Date  . Immobile, syndrome paraplegic   . 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 Hip  arthrotomy Right   . Small intestine surgery     Family History  Problem Relation Age of Onset  . Sinusitis Sister    Social History  Substance Use Topics  . Smoking status: Current Every Day Smoker -- 0.50 packs/day    Types: Cigarettes  . Smokeless tobacco: Not on file  . Alcohol Use: No    Review of Systems  Gastrointestinal: Positive for abdominal pain.  All other systems reviewed and are negative.     Allergies  Levaquin; Penicillins; and Vancomycin  Home Medications   Prior to Admission medications   Medication Sig Start Date End Date Taking? Authorizing Provider  acetaminophen (TYLENOL) 325 MG tablet Take 650 mg by mouth every 4 (four) hours as needed for pain or fever.    Historical Provider, MD  baclofen (LIORESAL) 10 MG tablet Take 10 mg by mouth 4 (four) times daily.     Historical Provider, MD  bisacodyl (DULCOLAX) 10 MG suppository Place 10 mg rectally daily as needed for moderate constipation.    Historical Provider, MD  diphenhydrAMINE (BENADRYL) 25 MG tablet Take 25 mg by mouth every 4 (four) hours as needed for itching.    Historical Provider, MD  fentaNYL (DURAGESIC - DOSED MCG/HR) 50 MCG/HR Place 1 patch (50 mcg total) onto the skin every 3 (three) days. APPLY WITH 75 MCG PATCH FOR A TOTAL OF 125 MCG 10/10/14   Meredeth IdeGagan S Lama, MD  fentaNYL (DURAGESIC - DOSED MCG/HR) 75 MCG/HR Place 1 patch (75 mcg total) onto the  skin every 3 (three) days. APPLY WITH 50 MCG PATCH FOR A TOTAL OF 125 MCG 10/10/14   Meredeth Ide, MD  ipratropium-albuterol (DUONEB) 0.5-2.5 (3) MG/3ML SOLN Take 3 mLs by nebulization 3 (three) times daily. Patient taking differently: Take 3 mLs by nebulization every 6 (six) hours as needed (for shortness of breath).  10/10/14   Meredeth Ide, MD  Linaclotide (LINZESS) 145 MCG CAPS capsule Take 145 mcg by mouth daily with breakfast.    Historical Provider, MD  loratadine (CLARITIN) 10 MG tablet Take 10 mg by mouth daily.    Historical Provider, MD   LORazepam (ATIVAN) 0.5 MG tablet Take 0.5 mg by mouth every 6 (six) hours as needed for anxiety.    Historical Provider, MD  naloxegol oxalate (MOVANTIK) 25 MG TABS tablet Take 25 mg by mouth daily.    Historical Provider, MD  oxybutynin (DITROPAN) 5 MG tablet Take 5 mg by mouth 4 (four) times daily.    Historical Provider, MD  oxyCODONE (ROXICODONE) 15 MG immediate release tablet Take 1 tablet (15 mg total) by mouth every 4 (four) hours as needed for pain. 10/10/14   Meredeth Ide, MD  polyethylene glycol (MIRALAX / GLYCOLAX) packet Take 17 g by mouth daily. Takes scheduled every day, may also have 1 more packet a day if needed    Historical Provider, MD  potassium chloride (K-DUR,KLOR-CON) 10 MEQ tablet Take 10 mEq by mouth 2 (two) times daily.    Historical Provider, MD  pregabalin (LYRICA) 100 MG capsule Take 100 mg by mouth 2 (two) times daily.    Historical Provider, MD  pseudoephedrine (SUDAFED) 60 MG tablet Take 60 mg by mouth every 6 (six) hours as needed for congestion.    Historical Provider, MD  rOPINIRole (REQUIP) 0.5 MG tablet Take 0.5 mg by mouth at bedtime.    Historical Provider, MD  simethicone (MYLICON) 125 MG chewable tablet Chew 125 mg by mouth every 6 (six) hours as needed for flatulence.    Historical Provider, MD  tiZANidine (ZANAFLEX) 4 MG tablet Take 4 mg by mouth every 12 (twelve) hours as needed for muscle spasms.    Historical Provider, MD   BP 139/81 mmHg  Pulse 91  Temp(Src) 99.7 F (37.6 C) (Oral)  Resp 16  SpO2 100% Physical Exam  Constitutional: He is oriented to person, place, and time. He appears well-developed and well-nourished. He appears distressed.  Appears uncomfortable  HENT:  Head: Normocephalic and atraumatic.  Mouth/Throat: Oropharynx is clear and moist.  Eyes: Conjunctivae and EOM are normal. Pupils are equal, round, and reactive to light.  Neck: Normal range of motion. Neck supple.  Cardiovascular: Normal rate, regular rhythm and intact distal  pulses.   No murmur heard. Pulmonary/Chest: Effort normal. No respiratory distress. He has decreased breath sounds in the right lower field and the left lower field. He has no wheezes. He has rhonchi. He has no rales.  Abdominal: Soft. He exhibits no distension. There is no tenderness. There is no rebound and no guarding.  Suprapubic catheter presents with foul drainage into the bag and tenderness with palpation and guarding in the left lower quadrant around suprapubic catheter site  Musculoskeletal: Normal range of motion. He exhibits edema. He exhibits no tenderness.  Contractures of the upper and lower extremities. Able to move the upper extremities purposefully. Decubitus ulcer over the sacrum. Pitting edema in the bilateral lower extremities  Neurological: He is alert and oriented to person, place, and time.  Skin:  Skin is warm and dry. No rash noted. No erythema.  Psychiatric: He has a normal mood and affect. His behavior is normal.  Nursing note and vitals reviewed.   ED Course  Procedures (including critical care time) Labs Review Labs Reviewed  COMPREHENSIVE METABOLIC PANEL - Abnormal; Notable for the following:    Potassium 2.7 (*)    CO2 19 (*)    Glucose, Bld 119 (*)    Calcium 8.5 (*)    Albumin 3.1 (*)    ALT 12 (*)    Total Bilirubin 1.9 (*)    All other components within normal limits  CBC - Abnormal; Notable for the following:    Hemoglobin 12.1 (*)    HCT 35.1 (*)    Platelets 71 (*)    All other components within normal limits  URINALYSIS, ROUTINE W REFLEX MICROSCOPIC (NOT AT [facility]) - Abnormal; Notable for the following:    APPearance TURBID (*)    pH 8.5 (*)    Hgb urine dipstick MODERATE (*)    Protein, ur 30 (*)    Leukocytes, UA LARGE (*)    All other components within normal limits  URINE MICROSCOPIC-ADD ON - Abnormal; Notable for the following:    Bacteria, UA MANY (*)    All other components within normal limits  URINE CULTURE  LIPASE, BLOOD  I-STAT  CG4 LACTIC ACID, ED  Rosezena Sensor, ED    Imaging Review Dg Chest 2 View  04/24/2015  CLINICAL DATA:  Cough EXAM: CHEST  2 VIEW COMPARISON:  10/08/2014 FINDINGS: Cardiomegaly. Minimal left base opacity likely reflects atelectasis. No focal opacity on the right. No effusions. No acute bony abnormality. IMPRESSION: Cardiomegaly.  Minimal left basilar opacity, likely atelectasis. Electronically Signed   By: Charlett Nose M.D.   On: 04/24/2015 13:16   Ct Abdomen Pelvis W Contrast  04/24/2015  CLINICAL DATA:  Chronic pain, left lower quadrant pain, chest burning, tobacco abuse, paraplegia EXAM: CT ABDOMEN AND PELVIS WITH CONTRAST TECHNIQUE: Multidetector CT imaging of the abdomen and pelvis was performed using the standard protocol following bolus administration of intravenous contrast. CONTRAST:  OMNIPAQUE IOHEXOL 300 MG/ML  SOLN COMPARISON:  11/02/2014 FINDINGS: Mild subsegmental dependent atelectasis in the visualized lung bases left greater than right. Intrahepatic central biliary ductal dilatation as before. No focal liver lesion. Prominent spleen. Unremarkable adrenal glands. Left nephrolithiasis, largest stone or cluster 8 mm in lower pole. Right nephrolithiasis with a 4 mm calculus in the lower pole. There is moderate right hydronephrosis and ureterectasis down nearly to the ureteral orifice without calculus. Urinary bladder decompressed by suprapubic catheter, directed towards the right ureteral orifice. Pancreatic parenchymal atrophy with scattered calcifications. Infrarenal IVC filter. Stomach and small bowel are nondilated. There is the large amount of stool in the splenic flexure and descending colon, and marked colonic dilatation of the sigmoid colon to diameter of 10.6 cm, with circumferential wall thickening in the mid and distal sigmoid colon and rectum, which has increased since previous exam. Adjacent inflammatory/edematous changes. No ascites.  No free air.  No adenopathy localized.  Chronic right hip dislocation. IMPRESSION: 1. New moderate right hydronephrosis and ureterectasis to the ureteral orifice, possibly obstructed by the tip of the suprapubic catheter. No ureteral calculus identified . 2. Progressive fecal distention, wall thickening, and adjacent inflammatory changes suggesting Stercoral colitis in the sigmoid colon and rectum. Electronically Signed   By: Corlis Leak M.D.   On: 04/24/2015 16:02   I have personally reviewed and evaluated these  images and lab results as part of my medical decision-making.   EKG Interpretation None      MDM   Final diagnoses:  Hydronephrosis with urinary obstruction due to ureteral calculus  Colitis  Hypokalemia    Patient is a 54 year old male with a history of paraplegia, suprapubic catheter who lives the facility and being brought in today for worsening cough, congestion, vomiting and lower abdominal pain. He states for the last 3 days symptoms have worsened and he has also noticed discoloration of his urine at his catheter site. His suprapubic catheter was changed in October and is normally changed every 30 days.  No ongoing shortness of breath at this time but ongoing vomiting this morning. Patient uses a fentanyl patch but requested that I remove it because it was causing his leg to burn. The new patch was placed yesterday.  Patient has decreased breath sounds on exam with normal heart rate. Tenderness with palpation around the suprapubic catheter site as well as cloudy urine with exudate present. No pus draining around the suprapubic catheter.  Small stage I decubitus ulcer but no evidence that that would be infected.  Concern for possible new UTI a/pyelonephritis causing vomiting and lower abdominal pain versus diverticulitis versus abscess. Patient is having normal bowel movements a low suspicion for obstruction at this time. Also given his respiratory complaint concern for possible new healthcare associated  pneumonia.  CBC, CMP, lipase, UA, lactate, chest x-ray pending. Patient given IV fluids, pain and nausea control.  4:47 PM CBC within normal limits. CMP with a new hypokalemia of 2.7 which was preplaced with IV runs of potassium. Lactate within normal limits and lipase normal. UA with signs of large leukocytes in 7-10 white blood cells. Patient is febrile to 101.5. CT showed evidence of Sterocolitis in the sigmoid colon and rectum evidence of right hydronephrosis from catheter placed in ureter.  Spoke with Dr. Marlou Porch with urology and ENT recommended just replacing the suprapubic catheter in that should alleviate the problem. Will admit patient for colitis and hypokalemia  Gwyneth Sprout, MD 04/24/15 [phone]

## 2015-04-24 NOTE — ED Notes (Signed)
Patient transported to X-ray 

## 2015-04-24 NOTE — ED Notes (Signed)
Bed: [id number] Expected date:  Expected time:  Means of arrival:  Comments: abd pain, chest burning.

## 2015-04-24 NOTE — ED Notes (Signed)
Pt wet himself. I toweled off his legs, but pt refuses to allow this nurse to change his gown at this time.

## 2015-04-24 NOTE — Consult Note (Signed)
Spoke to Dr. Anitra LauthPlunkett regarding CT scan  - SP tube tip looks to be in the right ureteral orifice.  I suggested the SP tube be changed which should take care of the issue.  I would recommend that a repeat renal ultrasound in 2-3 days to ensure that the hydronephrosis resolves with this intervention.  If not, please consult urology, happy to help if necessary.  I have not seen the patient personally - only reviewed his Xray.  He has not seen anyone in Urology in [place] in a while. Last documented encounter was in 2007 with Dr. Earlene Plateravis.  He may still see Earlene Plateravis at [facility].

## 2015-04-24 NOTE — ED Notes (Signed)
Pt was offered Tylenol again and complied.

## 2015-04-24 NOTE — Progress Notes (Signed)
CRITICAL VALUE ALERT  Critical value received: potassium 2.7  Date of notification:  04/24/15  Time of notification:  1258  Critical value read back: yes  Nurse who received alert:  J. Hamilton  MD notified (1st page) Dr. Anitra LauthPlunkett  Time of first page:  1258  MD notified (2nd page): na  Time of second page: na  Responding MD:    Time MD responded:

## 2015-04-24 NOTE — ED Notes (Signed)
Pt was offered a gown change again and complied.

## 2015-04-24 NOTE — ED Notes (Signed)
Charge nurse was called to be notified in regard to delay in patient transport. Charge nurse not able to take call at this time. Patient will be transported now.

## 2015-04-24 NOTE — ED Notes (Signed)
Nurse working with patient now to get a IV and labs.

## 2015-04-24 NOTE — ED Notes (Addendum)
According to patient he is out of his pain medication at the nursing facility [facility]. Medications clarify with MAR and facility. Patient is not out of pain medication. Patient has history of chronic pain and get very angry and agitated. He complains of left lower quadrant pain on today. Patient also complains of burning in his chest. He is chronic everyday smoker. Patient has chronic foley catheter present on arrival.

## 2015-04-24 NOTE — ED Notes (Signed)
Pt refused Tylenol. States "Ive never taken it and wont start now; my mama taught me that"

## 2015-04-24 NOTE — ED Notes (Signed)
1740
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
## 2015-04-24 NOTE — ED Notes (Signed)
Secretary called for IV pumps. Waiting for pump to administer potassium.

## 2015-04-24 NOTE — ED Notes (Signed)
AC has been contacted and is aware about lack of IV pumps in the ED. Continuing to wait on IV pump to administer potassium.

## 2015-04-25 ENCOUNTER — Inpatient Hospital Stay (HOSPITAL_COMMUNITY): Payer: Medicare Other

## 2015-04-25 DIAGNOSIS — N1330 Unspecified hydronephrosis: Secondary | ICD-10-CM

## 2015-04-25 DIAGNOSIS — G8929 Other chronic pain: Secondary | ICD-10-CM

## 2015-04-25 DIAGNOSIS — K529 Noninfective gastroenteritis and colitis, unspecified: Secondary | ICD-10-CM

## 2015-04-25 DIAGNOSIS — K5641 Fecal impaction: Secondary | ICD-10-CM

## 2015-04-25 DIAGNOSIS — R509 Fever, unspecified: Secondary | ICD-10-CM

## 2015-04-25 DIAGNOSIS — N390 Urinary tract infection, site not specified: Secondary | ICD-10-CM

## 2015-04-25 LAB — CBC
HEMATOCRIT: 30.3 % — AB (ref 39.0–52.0)
HEMOGLOBIN: 10.2 g/dL — AB (ref 13.0–17.0)
MCH: 27.6 pg (ref 26.0–34.0)
MCHC: 33.7 g/dL (ref 30.0–36.0)
MCV: 81.9 fL (ref 78.0–100.0)
Platelets: 60 10*3/uL — ABNORMAL LOW (ref 150–400)
RBC: 3.7 MIL/uL — AB (ref 4.22–5.81)
RDW: 15.3 % (ref 11.5–15.5)
WBC: 5.4 10*3/uL (ref 4.0–10.5)

## 2015-04-25 LAB — MAGNESIUM: MAGNESIUM: 1.3 mg/dL — AB (ref 1.7–2.4)

## 2015-04-25 LAB — BASIC METABOLIC PANEL
ANION GAP: 6 (ref 5–15)
BUN: 10 mg/dL (ref 6–20)
CALCIUM: 7.7 mg/dL — AB (ref 8.9–10.3)
CHLORIDE: 110 mmol/L (ref 101–111)
CO2: 19 mmol/L — AB (ref 22–32)
Creatinine, Ser: 0.74 mg/dL (ref 0.61–1.24)
GFR calc non Af Amer: >60 mL/min (ref >60)
GLUCOSE: 109 mg/dL — AB (ref 65–99)
POTASSIUM: 3.4 mmol/L — AB (ref 3.5–5.1)
Sodium: 135 mmol/L (ref 135–145)

## 2015-04-25 LAB — PHOSPHORUS: Phosphorus: 2.1 mg/dL — ABNORMAL LOW (ref 2.5–4.6)

## 2015-04-25 LAB — MRSA PCR SCREENING: MRSA BY PCR: NEGATIVE

## 2015-04-25 MED ORDER — MAGNESIUM SULFATE 50 % IJ SOLN
3.0000 g | Freq: Once | INTRAVENOUS | Status: AC
  Administered 2015-04-25: 3 g via INTRAVENOUS
  Filled 2015-04-25: qty 6

## 2015-04-25 MED ORDER — SODIUM PHOSPHATE 3 MMOLE/ML IV SOLN
30.0000 mmol | Freq: Once | INTRAVENOUS | Status: AC
  Administered 2015-04-25: 30 mmol via INTRAVENOUS
  Filled 2015-04-25: qty 10

## 2015-04-25 MED ORDER — ENSURE ENLIVE PO LIQD
237.0000 mL | Freq: Two times a day (BID) | ORAL | Status: DC
  Administered 2015-04-25 – 2015-04-27 (×4): 237 mL via ORAL

## 2015-04-25 MED ORDER — SODIUM CHLORIDE 0.9 % IV SOLN
500.0000 mg | Freq: Four times a day (QID) | INTRAVENOUS | Status: DC
  Administered 2015-04-25 – 2015-04-27 (×7): 500 mg via INTRAVENOUS
  Filled 2015-04-25 (×9): qty 500

## 2015-04-25 MED ORDER — ALUM & MAG HYDROXIDE-SIMETH 200-200-20 MG/5ML PO SUSP
15.0000 mL | Freq: Four times a day (QID) | ORAL | Status: DC | PRN
  Filled 2015-04-25: qty 30

## 2015-04-25 NOTE — Clinical Documentation Improvement (Signed)
Hospitalist  A cause and effect relationship may not be assumed and must be documented by a provider.  Please clarify the relationship, if any, between "possible UTI"  and Suprapubic Catheter.  Are the conditions:  Due to or associated with each other  Unrelated to each other  Other  Clinically Undetermined  Supporting Information (As per notes): "Also today he developed left lower quadrant pain around his suprapubic catheter site. He also notes change in the color of his urine in the last 3 days."  Please update your documentation within the medical record to reflect your response to this query. Thank you.  Please exercise your independent, professional judgment when responding. A specific answer is not anticipated or expected.   Thank You,  Nevin BloodgoodJoan B Brienne Liguori [employer] [facility] [place] [phone]

## 2015-04-25 NOTE — Progress Notes (Signed)
Pt temp 102.4, BP 111/62, HR 92.  Adm  Tylenol 650 mg. Notified MD. Will continue to monitor.
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
## 2015-04-25 NOTE — Plan of Care (Signed)
Problem: Skin Integrity: Goal: Risk for impaired skin integrity will decrease Outcome: Not Progressing Pt has pressure ulcers on buttocks and hip  Problem: Tissue Perfusion: Goal: Risk factors for ineffective tissue perfusion will decrease Outcome: Progressing SCDs placed

## 2015-04-25 NOTE — Consult Note (Signed)
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  Patient is paraplegic but able to turn self in bed with minimal assistance. HAd a large bowel movement while assessing.  Wound type:Pressure injury, stage 3 to sacrum unstageable pressure injury to coccyx, covered in scab Incontinence associated skin damage to buttocks Pressure Ulcer POA: Yes Measurement: Sacrum 1.2 cm x 0.5 cm x 0.5 cm pale pink nongranulating wound Scabbed lesion to coccyx 0.5 cm in diameter Left buttock with 0.5 cm x 0.5 cm x 0.1 cm denuded area (incontinence associated skin damage) pale pink nongranulating  Drainage (amount, consistency, odor) minimal serosanguinous drainage.  Periwound:Scarring from previous injuries to buttocks and sacrum Dressing procedure/placement/frequency:Cleanse buttocks and sacrum with soap and water.  Apply moisture barrier cream to protect against bowel incontinence.  Gently fill sacral wound with NS moist gauze.  Cover with Allevyn silicone border foam dressing.  Change NS moist gauze daily. Change silicone foam every 3 days and PRN soilage.  Heels offloaded with boots.  Will not follow at this time.  Please re-consult if needed.  Maple HudsonKaren Lileigh Fahringer RN BSN CWON Pager [phone]
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
## 2015-04-26 ENCOUNTER — Inpatient Hospital Stay (HOSPITAL_COMMUNITY): Payer: Medicare Other

## 2015-04-26 DIAGNOSIS — R609 Edema, unspecified: Secondary | ICD-10-CM

## 2015-04-26 LAB — BASIC METABOLIC PANEL
ANION GAP: 5 (ref 5–15)
BUN: 8 mg/dL (ref 6–20)
CHLORIDE: 112 mmol/L — AB (ref 101–111)
CO2: 20 mmol/L — AB (ref 22–32)
Calcium: 7.6 mg/dL — ABNORMAL LOW (ref 8.9–10.3)
Creatinine, Ser: 0.56 mg/dL — ABNORMAL LOW (ref 0.61–1.24)
GFR calc Af Amer: >60 mL/min (ref >60)
GFR calc non Af Amer: >60 mL/min (ref >60)
GLUCOSE: 107 mg/dL — AB (ref 65–99)
POTASSIUM: 4.6 mmol/L (ref 3.5–5.1)
Sodium: 137 mmol/L (ref 135–145)

## 2015-04-26 LAB — URINE CULTURE

## 2015-04-26 LAB — PHOSPHORUS: Phosphorus: 2.6 mg/dL (ref 2.5–4.6)

## 2015-04-26 LAB — MAGNESIUM: Magnesium: 2 mg/dL (ref 1.7–2.4)

## 2015-04-26 NOTE — NC FL2 (Signed)
[place] MEDICAID FL2 LEVEL OF CARE SCREENING TOOL     IDENTIFICATION  Patient Name: Alan Warner Birthdate: 1960-12-22 Sex: male Admission Date (Current Location): 04/24/2015  [facility] [place]   Facility and Address:  [facility],  [address], [place] [id number]      Provider Number: [id number]  Attending Physician Name and Address:  Kathlen ModyVijaya Akula, MD  Relative Name and Phone Number:       Current Level of Care: Hospital Recommended Level of Care: Skilled Nursing Facility Prior Approval Number:    Date Approved/Denied:   PASRR Number:    Discharge Plan: SNF    Current Diagnoses: Patient Active Problem List   Diagnosis Date Noted  . 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 Fecal impaction (HCC) 04/24/2015  . Hydronephrosis, right 04/24/2015  . Hypokalemia 04/24/2015  . Fever with multiple potential sources of infection 04/24/2015  . UTI (lower urinary tract infection) 04/24/2015  . Complicated UTI (urinary tract infection) 04/24/2015  . Cough 10/05/2014  . Paraplegia (HCC) 10/05/2014  . Chronic pain 10/05/2014  . Sacral decubitus ulcer 10/05/2014    Orientation ACTIVITIES/SOCIAL BLADDER RESPIRATION    Self, Time, Situation, Place  Active Incontinent Normal  BEHAVIORAL SYMPTOMS/MOOD NEUROLOGICAL BOWEL NUTRITION STATUS      Incontinent Diet  PHYSICIAN VISITS COMMUNICATION OF NEEDS Height & Weight Skin    Verbally   174 lbs. PU Stage and Appropriate Care   PU Stage 2 Dressing:  (see WOC note)      AMBULATORY STATUS RESPIRATION    Assist extensive Normal      Personal Care Assistance Level of Assistance  Bathing, Dressing Bathing Assistance: Maximum assistance Feeding assistance: Limited assistance Dressing Assistance: Maximum assistance      Functional Limitations Info                SPECIAL CARE FACTORS FREQUENCY   (wound care)                   Additional Factors Info                  Current  Medications (04/26/2015): Current Facility-Administered Medications  Medication Dose Route Frequency Provider Last Rate Last Dose  . 0.9 % NaCl with KCl 40 mEq / L  infusion   Intravenous Continuous Maryruth Bunhristina P Rama, MD 100 mL/hr at 04/26/15 1017 100 mL/hr at 04/26/15 1017  . acetaminophen (TYLENOL) tablet 650 mg  650 mg Oral Q6H PRN Maryruth Bunhristina P Rama, MD   650 mg at 04/25/15 1519   Or  . acetaminophen (TYLENOL) suppository 650 mg  650 mg Rectal Q6H PRN Maryruth Bunhristina P Rama, MD      . alum & mag hydroxide-simeth (MAALOX/MYLANTA) 200-200-20 MG/5ML suspension 15 mL  15 mL Oral Q6H PRN Kathlen ModyVijaya Akula, MD      . baclofen (LIORESAL) tablet 10 mg  10 mg Oral QID Maryruth Bunhristina P Rama, MD   10 mg at 04/26/15 1022  . bisacodyl (DULCOLAX) suppository 10 mg  10 mg Rectal Daily PRN Maryruth Bunhristina P Rama, MD      . feeding supplement (ENSURE ENLIVE) (ENSURE ENLIVE) liquid 237 mL  237 mL Oral BID BM Anderson MaltaJessica M Ostheim, RD   237 mL at 04/26/15 1028  . fentaNYL (DURAGESIC - dosed mcg/hr) 75 mcg  75 mcg Transdermal Q72H Maryruth Bunhristina P Rama, MD   75 mcg at 04/26/15 1023  . guaiFENesin (MUCINEX) 12 hr tablet 600 mg  600 mg Oral BID Christina P Rama,  MD   600 mg at 04/26/15 1022  . guaiFENesin-dextromethorphan (ROBITUSSIN DM) 100-10 MG/5ML syrup 5 mL  5 mL Oral Q4H PRN Christina P Rama, MD      . imipenem-cilastatin (PRIMAXIN) 500 mg in sodium chloride 0.9 % 100 mL IVPB  500 mg Intravenous Q6H Kathlen Mody, MD   500 mg at 04/26/15 1323  . ipratropium-albuterol (DUONEB) 0.5-2.5 (3) MG/3ML nebulizer solution 3 mL  3 mL Nebulization Q6H PRN Maryruth Bun Rama, MD      . Linaclotide (LINZESS) capsule 145 mcg  145 mcg Oral Q breakfast Maryruth Bun Rama, MD   145 mcg at 04/26/15 1022  . loratadine (CLARITIN) tablet 10 mg  10 mg Oral Daily Maryruth Bun Rama, MD   10 mg at 04/26/15 1020  . LORazepam (ATIVAN) tablet 0.5 mg  0.5 mg Oral Q6H PRN Maryruth Bun Rama, MD   0.5 mg at 04/25/15 2216  . magnesium citrate solution 1 Bottle  1 Bottle Oral Once  PRN Maryruth Bun Rama, MD      . morphine 4 MG/ML injection 4 mg  4 mg Intravenous Q4H PRN Christina P Rama, MD      . naloxegol oxalate (MOVANTIK) tablet 25 mg  25 mg Oral Daily Maryruth Bun Rama, MD   25 mg at 04/26/15 1021  . ondansetron (ZOFRAN) tablet 4 mg  4 mg Oral Q6H PRN Christina P Rama, MD       Or  . ondansetron (ZOFRAN) injection 4 mg  4 mg Intravenous Q6H PRN Christina P Rama, MD      . oxybutynin (DITROPAN) tablet 5 mg  5 mg Oral QID Maryruth Bun Rama, MD   5 mg at 04/26/15 1018  . oxyCODONE (Oxy IR/ROXICODONE) immediate release tablet 15 mg  15 mg Oral Q4H PRN Maryruth Bun Rama, MD   15 mg at 04/26/15 1431  . polyethylene glycol (MIRALAX / GLYCOLAX) packet 17 g  17 g Oral BID Maryruth Bun Rama, MD   17 g at 04/24/15 2154  . potassium chloride SA (K-DUR,KLOR-CON) CR tablet 20 mEq  20 mEq Oral TID Maryruth Bun Rama, MD   20 mEq at 04/26/15 1020  . pregabalin (LYRICA) capsule 100 mg  100 mg Oral BID Maryruth Bun Rama, MD   100 mg at 04/26/15 1023  . rOPINIRole (REQUIP) tablet 0.5 mg  0.5 mg Oral QHS Maryruth Bun Rama, MD   0.5 mg at 04/25/15 2154  . simethicone (MYLICON) chewable tablet 80 mg  80 mg Oral QID Maryruth Bun Rama, MD   80 mg at 04/25/15 2200  . sodium chloride 0.9 % injection 3 mL  3 mL Intravenous Q12H Maryruth Bun Rama, MD   3 mL at 04/25/15 2200  . tiZANidine (ZANAFLEX) tablet 4 mg  4 mg Oral Q12H PRN Maryruth Bun Rama, MD       Do not use this list as official medication orders. Please verify with discharge summary.  Discharge Medications:   Medication List    ASK your doctor about these medications        acetaminophen 325 MG tablet  Commonly known as:  TYLENOL  Take 650 mg by mouth every 4 (four) hours as needed for pain or fever.     baclofen 10 MG tablet  Commonly known as:  LIORESAL  Take 10 mg by mouth 4 (four) times daily.     bisacodyl 10 MG suppository  Commonly known as:  DULCOLAX  Place 10 mg rectally daily as needed for  moderate constipation.      diphenhydrAMINE 25 MG tablet  Commonly known as:  BENADRYL  Take 25 mg by mouth every 4 (four) hours as needed for itching.     fentaNYL 50 MCG/HR  Commonly known as:  DURAGESIC - dosed mcg/hr  Place 1 patch (50 mcg total) onto the skin every 3 (three) days. APPLY WITH 75 MCG PATCH FOR A TOTAL OF 125 MCG     fentaNYL 75 MCG/HR  Commonly known as:  DURAGESIC - dosed mcg/hr  Place 1 patch (75 mcg total) onto the skin every 3 (three) days. APPLY WITH 50 MCG PATCH FOR A TOTAL OF 125 MCG     ipratropium-albuterol 0.5-2.5 (3) MG/3ML Soln  Commonly known as:  DUONEB  Take 3 mLs by nebulization 3 (three) times daily.     Linaclotide 145 MCG Caps capsule  Commonly known as:  LINZESS  Take 145 mcg by mouth daily with breakfast.     loratadine 10 MG tablet  Commonly known as:  CLARITIN  Take 10 mg by mouth daily.     LORazepam 0.5 MG tablet  Commonly known as:  ATIVAN  Take 0.5 mg by mouth every 6 (six) hours as needed for anxiety.     naloxegol oxalate 25 MG Tabs tablet  Commonly known as:  MOVANTIK  Take 25 mg by mouth daily.     oxybutynin 5 MG tablet  Commonly known as:  DITROPAN  Take 5 mg by mouth 4 (four) times daily.     oxyCODONE 15 MG immediate release tablet  Commonly known as:  ROXICODONE  Take 1 tablet (15 mg total) by mouth every 4 (four) hours as needed for pain.     polyethylene glycol packet  Commonly known as:  MIRALAX / GLYCOLAX  Take 17 g by mouth daily. Takes scheduled every day, may also have 1 more packet a day if needed     potassium chloride 10 MEQ tablet  Commonly known as:  K-DUR,KLOR-CON  Take 10 mEq by mouth 2 (two) times daily.     pregabalin 100 MG capsule  Commonly known as:  LYRICA  Take 100 mg by mouth 2 (two) times daily.     pseudoephedrine 60 MG tablet  Commonly known as:  SUDAFED  Take 60 mg by mouth every 6 (six) hours as needed for congestion.     rOPINIRole 0.5 MG tablet  Commonly known as:  REQUIP  Take 0.5 mg by mouth at  bedtime.     simethicone 125 MG chewable tablet  Commonly known as:  MYLICON  Chew 125 mg by mouth 4 (four) times daily - after meals and at bedtime.     tiZANidine 4 MG tablet  Commonly known as:  ZANAFLEX  Take 4 mg by mouth every 12 (twelve) hours as needed for muscle spasms.        Relevant Imaging Results:  Relevant Lab Results:  Recent Labs    Additional Information    Liliana Cline, LCSW
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
## 2015-04-26 NOTE — Clinical Social Work Note (Signed)
Clinical Social Work Assessment  Patient Details  Name: Alan Warner MRN: [id number] Date of Birth: January 06, 1961  Date of referral:                  Reason for consult:                   Permission sought to share information with:    Permission granted to share information::     Name::        Agency::     Relationship::     Contact Information:     Housing/Transportation Living arrangements for the past 2 months:    Source of Information:    Patient Interpreter Needed:    Criminal Activity/Legal Involvement Pertinent to Current Situation/Hospitalization:    Significant Relationships:    Lives with:    Do you feel safe going back to the place where you live?    Need for family participation in patient care:     Care giving concerns:  [facility]       Social Worker assessment / plan:  [facility] where he reports he has been for 4 years- he plans to return there at dc and I have confirmed this plan with SNF as well.   Employment status:    Insurance information:    PT Recommendations:    Information / Referral to community resources:     Patient/Family's Response to care:  Patient not very talkative- states he is getting wound care and plans to return to SNF at dc.   Patient/Family's Understanding of and Emotional Response to Diagnosis, Current Treatment, and Prognosis:  He reports understanding his care and treatment needs at this time.   Emotional Assessment Appearance:    Attitude/Demeanor/Rapport:    Affect (typically observed):    Orientation:    Alcohol / Substance use:    Psych involvement (Current and /or in the community):     Discharge Needs  Concerns to be addressed:    Readmission within the last 30 days:    Current discharge risk:    Barriers to Discharge:      Alan Warner, Alan Harbach Parks, LCSW 04/26/2015, 2:16 PM

## 2015-04-26 NOTE — Progress Notes (Signed)
*  PRELIMINARY RESULTS* Vascular Ultrasound Lower extremity venous duplex has been completed.  Preliminary findings: No evidence of DVT in visualized veins.  Farrel DemarkJill Eunice, RDMS, RVT  04/26/2015, 10:45 AM

## 2015-04-27 MED ORDER — OXYCODONE HCL 15 MG PO TABS: 15.0000 mg | ORAL_TABLET | Freq: Four times a day (QID) | ORAL | Status: DC | PRN

## 2015-04-27 MED ORDER — METRONIDAZOLE 500 MG PO TABS: 500.0000 mg | ORAL_TABLET | Freq: Three times a day (TID) | ORAL | Status: DC

## 2015-04-27 MED ORDER — LORAZEPAM 0.5 MG PO TABS: 0.5000 mg | ORAL_TABLET | Freq: Four times a day (QID) | ORAL | Status: DC | PRN

## 2015-04-27 MED ORDER — GUAIFENESIN-DM 100-10 MG/5ML PO SYRP: 5.0000 mL | ORAL_SOLUTION | ORAL | Status: DC | PRN

## 2015-04-27 MED ORDER — GUAIFENESIN ER 600 MG PO TB12: 600.0000 mg | ORAL_TABLET | Freq: Two times a day (BID) | ORAL | Status: DC

## 2015-04-27 MED ORDER — CEFPODOXIME PROXETIL 200 MG PO TABS: 200.0000 mg | ORAL_TABLET | Freq: Two times a day (BID) | ORAL | Status: DC

## 2015-04-27 MED ORDER — CEFPODOXIME PROXETIL 200 MG PO TABS
200.0000 mg | ORAL_TABLET | Freq: Two times a day (BID) | ORAL | Status: DC
  Filled 2015-04-27: qty 1

## 2015-04-27 MED ORDER — ENSURE ENLIVE PO LIQD: 237.0000 mL | Freq: Two times a day (BID) | ORAL | Status: DC

## 2015-04-27 NOTE — Progress Notes (Signed)
PT Note  Patient Details Name: Alan Warner MRN: [id number] DOB: Apr 09, 1961    Treatment:    Reason Eval/Treat Not Completed: PT screened, no needs identified, will sign off   Checked on pt with nurse. Pt is being transferred back to facility within the hour. No need to see at this time.    Marella BileBRITT, Cj Beecher 04/27/2015, 2:41 PM

## 2015-04-27 NOTE — Progress Notes (Signed)
Pt discharged [facility] via [facility]. VSS. IV discontinued and site is clean, dry and intact. Patient notified family. [facility].

## 2015-04-27 NOTE — Care Management Important Message (Signed)
Important Message  Patient Details  Name: Alan LaineJerry Warner [place] MRN: [id number] Date of Birth: [birth year]   Medicare Important Message Given:  Yes    Haskell FlirtJamison, Sarinah Doetsch 04/27/2015, 1:47 PMImportant Message  Patient Details  Name: Alan LaineJerry Warner [place] MRN: [id number] Date of Birth: [birth year]   Medicare Important Message Given:  Yes    Haskell FlirtJamison, Avalyn Molino 04/27/2015, 1:47 PM
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
## 2015-04-27 NOTE — Progress Notes (Signed)
Patient for d/c today [facility]. Patient agreeable to this plan-states he has updated family- plan transfer via EMS.   Reece LevyJanet June Rode, MSW, Theresia MajorsLCSWA [phone]

## 2015-04-30 LAB — CULTURE, BLOOD (ROUTINE X 2)
CULTURE: NO GROWTH
Culture: NO GROWTH
Culture: NO GROWTH
Culture: NO GROWTH

## 2015-06-02 ENCOUNTER — Encounter (HOSPITAL_COMMUNITY): Payer: Self-pay | Admitting: *Deleted

## 2015-06-02 ENCOUNTER — Emergency Department (HOSPITAL_COMMUNITY)
Admission: EM | Admit: 2015-06-02 | Discharge: 2015-06-02 | Disposition: A | Discharge status: Home / Self Care | Payer: Medicare Other | Attending: Emergency Medicine | Admitting: Emergency Medicine

## 2015-06-02 ENCOUNTER — Emergency Department (HOSPITAL_COMMUNITY): Payer: Medicare Other

## 2015-06-02 DIAGNOSIS — Z8659 Personal history of other mental and behavioral disorders: Secondary | ICD-10-CM | POA: Insufficient documentation

## 2015-06-02 DIAGNOSIS — Z792 Long term (current) use of antibiotics: Secondary | ICD-10-CM | POA: Diagnosis not present

## 2015-06-02 DIAGNOSIS — Z79891 Long term (current) use of opiate analgesic: Secondary | ICD-10-CM | POA: Diagnosis not present

## 2015-06-02 DIAGNOSIS — Z872 Personal history of diseases of the skin and subcutaneous tissue: Secondary | ICD-10-CM | POA: Insufficient documentation

## 2015-06-02 DIAGNOSIS — Z79899 Other long term (current) drug therapy: Secondary | ICD-10-CM | POA: Diagnosis not present

## 2015-06-02 DIAGNOSIS — Z8701 Personal history of pneumonia (recurrent): Secondary | ICD-10-CM | POA: Insufficient documentation

## 2015-06-02 DIAGNOSIS — Z8619 Personal history of other infectious and parasitic diseases: Secondary | ICD-10-CM | POA: Diagnosis not present

## 2015-06-02 DIAGNOSIS — Z8669 Personal history of other diseases of the nervous system and sense organs: Secondary | ICD-10-CM | POA: Diagnosis not present

## 2015-06-02 DIAGNOSIS — Z8739 Personal history of other diseases of the musculoskeletal system and connective tissue: Secondary | ICD-10-CM | POA: Diagnosis not present

## 2015-06-02 DIAGNOSIS — F17210 Nicotine dependence, cigarettes, uncomplicated: Secondary | ICD-10-CM | POA: Diagnosis not present

## 2015-06-02 DIAGNOSIS — N390 Urinary tract infection, site not specified: Secondary | ICD-10-CM | POA: Diagnosis not present

## 2015-06-02 DIAGNOSIS — Z862 Personal history of diseases of the blood and blood-forming organs and certain disorders involving the immune mechanism: Secondary | ICD-10-CM | POA: Diagnosis not present

## 2015-06-02 DIAGNOSIS — N3289 Other specified disorders of bladder: Secondary | ICD-10-CM

## 2015-06-02 DIAGNOSIS — Z880 Allergy status to penicillin: Secondary | ICD-10-CM | POA: Diagnosis not present

## 2015-06-02 LAB — BASIC METABOLIC PANEL
ANION GAP: 8 (ref 5–15)
BUN: 11 mg/dL (ref 6–20)
CO2: 22 mmol/L (ref 22–32)
Calcium: 8.7 mg/dL — ABNORMAL LOW (ref 8.9–10.3)
Chloride: 109 mmol/L (ref 101–111)
Creatinine, Ser: 0.6 mg/dL — ABNORMAL LOW (ref 0.61–1.24)
GLUCOSE: 94 mg/dL (ref 65–99)
POTASSIUM: 3.8 mmol/L (ref 3.5–5.1)
Sodium: 139 mmol/L (ref 135–145)

## 2015-06-02 LAB — URINE MICROSCOPIC-ADD ON

## 2015-06-02 LAB — URINALYSIS, ROUTINE W REFLEX MICROSCOPIC
BILIRUBIN URINE: NEGATIVE
Glucose, UA: NEGATIVE mg/dL
Ketones, ur: NEGATIVE mg/dL
NITRITE: POSITIVE — AB
PH: 7.5 (ref 5.0–8.0)
Protein, ur: NEGATIVE mg/dL
SPECIFIC GRAVITY, URINE: 1.006 (ref 1.005–1.030)

## 2015-06-02 LAB — CBC WITH DIFFERENTIAL/PLATELET
BASOS ABS: 0 10*3/uL (ref 0.0–0.1)
Basophils Relative: 0 %
Eosinophils Absolute: 0.1 10*3/uL (ref 0.0–0.7)
Eosinophils Relative: 1 %
HEMATOCRIT: 32.6 % — AB (ref 39.0–52.0)
Hemoglobin: 10.7 g/dL — ABNORMAL LOW (ref 13.0–17.0)
LYMPHS PCT: 23 %
Lymphs Abs: 1.1 10*3/uL (ref 0.7–4.0)
MCH: 27.4 pg (ref 26.0–34.0)
MCHC: 32.8 g/dL (ref 30.0–36.0)
MCV: 83.4 fL (ref 78.0–100.0)
MONO ABS: 0.4 10*3/uL (ref 0.1–1.0)
MONOS PCT: 8 %
NEUTROS ABS: 3.2 10*3/uL (ref 1.7–7.7)
NEUTROS PCT: 68 %
Platelets: 203 10*3/uL (ref 150–400)
RBC: 3.91 MIL/uL — ABNORMAL LOW (ref 4.22–5.81)
RDW: 16.4 % — AB (ref 11.5–15.5)
WBC: 4.7 10*3/uL (ref 4.0–10.5)

## 2015-06-02 LAB — I-STAT CG4 LACTIC ACID, ED: Lactic Acid, Venous: 1.27 mmol/L (ref 0.5–2.0)

## 2015-06-02 MED ORDER — SULFAMETHOXAZOLE-TRIMETHOPRIM 800-160 MG PO TABS: 1.0000 | ORAL_TABLET | Freq: Two times a day (BID) | ORAL | Status: AC

## 2015-06-02 MED ORDER — SODIUM CHLORIDE 0.9 % IV BOLUS (SEPSIS)
500.0000 mL | Freq: Once | INTRAVENOUS | Status: AC
  Administered 2015-06-02: 500 mL via INTRAVENOUS

## 2015-06-02 MED ORDER — MORPHINE SULFATE (PF) 4 MG/ML IV SOLN
4.0000 mg | Freq: Once | INTRAVENOUS | Status: AC
  Administered 2015-06-02: 4 mg via INTRAVENOUS
  Filled 2015-06-02: qty 1

## 2015-06-02 NOTE — Discharge Instructions (Signed)
Bactrim as prescribed.  Return to the ER symptoms acutely worsen or change.   Urinary Tract Infection Urinary tract infections (UTIs) can develop anywhere along your urinary tract. Your urinary tract is your body's drainage system for removing wastes and extra water. Your urinary tract includes two kidneys, two ureters, a bladder, and a urethra. Your kidneys are a pair of bean-shaped organs. Each kidney is about the size of your fist. They are located below your ribs, one on each side of your spine. CAUSES Infections are caused by microbes, which are microscopic organisms, including fungi, viruses, and bacteria. These organisms are so small that they can only be seen through a microscope. Bacteria are the microbes that most commonly cause UTIs. SYMPTOMS  Symptoms of UTIs may vary by age and gender of the patient and by the location of the infection. Symptoms in young women typically include a frequent and intense urge to urinate and a painful, burning feeling in the bladder or urethra during urination. Older women and men are more likely to be tired, shaky, and weak and have muscle aches and abdominal pain. A fever may mean the infection is in your kidneys. Other symptoms of a kidney infection include pain in your back or sides below the ribs, nausea, and vomiting. DIAGNOSIS To diagnose a UTI, your caregiver will ask you about your symptoms. Your caregiver will also ask you to provide a urine sample. The urine sample will be tested for bacteria and white blood cells. White blood cells are made by your body to help fight infection. TREATMENT  Typically, UTIs can be treated with medication. Because most UTIs are caused by a bacterial infection, they usually can be treated with the use of antibiotics. The choice of antibiotic and length of treatment depend on your symptoms and the type of bacteria causing your infection. HOME CARE INSTRUCTIONS  If you were prescribed antibiotics, take them exactly as  your caregiver instructs you. Finish the medication even if you feel better after you have only taken some of the medication.  Drink enough water and fluids to keep your urine clear or pale yellow.  Avoid caffeine, tea, and carbonated beverages. They tend to irritate your bladder.  Empty your bladder often. Avoid holding urine for long periods of time.  Empty your bladder before and after sexual intercourse.  After a bowel movement, women should cleanse from front to back. Use each tissue only once. SEEK MEDICAL CARE IF:   You have back pain.  You develop a fever.  Your symptoms do not begin to resolve within 3 days. SEEK IMMEDIATE MEDICAL CARE IF:   You have severe back pain or lower abdominal pain.  You develop chills.  You have nausea or vomiting.  You have continued burning or discomfort with urination. MAKE SURE YOU:   Understand these instructions.  Will watch your condition.  Will get help right away if you are not doing well or get worse.   This information is not intended to replace advice given to you by your health care provider. Make sure you discuss any questions you have with your health care provider.   Document Released: 03/12/2005 Document Revised: 02/21/2015 Document Reviewed: 07/11/2011 Elsevier Interactive Patient Education [employer] Inc.

## 2015-06-02 NOTE — ED Notes (Signed)
Per EMS - patient comes [facility] on [address] with c/o bladder spasms.  Patient has suprapubic catheter and states output has been normal.  Patient states he has had bladder spasms x2 days ago.  Patient states his meds were changed on 12/6 and his oxybutynin was changed from 10 mg ER q 24 hours to 5 mg 4x a day.  Patient was hypotensive on scene at 90/60, which he states is normal.

## 2015-06-02 NOTE — ED Notes (Signed)
Patient comes from SNF with c/o urinary bladder spasms. Patient denies urinary retention, fever, N/V/D.  Patient endorses occasional constipation.  Patient's abdomen is soft, but mildly tender to palpation LLQ.  Patient's lung sounds are clear to auscultation bilaterally and radial pulses are +1 bilaterally.

## 2015-06-02 NOTE — ED Notes (Signed)
Changed foley bag, waiting for urine to drain.
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
## 2015-07-28 ENCOUNTER — Emergency Department (HOSPITAL_COMMUNITY)
Admission: EM | Admit: 2015-07-28 | Discharge: 2015-07-28 | Disposition: A | Discharge status: Home / Self Care | Payer: Medicare Other | Attending: Emergency Medicine | Admitting: Emergency Medicine

## 2015-07-28 DIAGNOSIS — F17210 Nicotine dependence, cigarettes, uncomplicated: Secondary | ICD-10-CM | POA: Diagnosis not present

## 2015-07-28 DIAGNOSIS — T83090A Other mechanical complication of cystostomy catheter, initial encounter: Secondary | ICD-10-CM

## 2015-07-28 DIAGNOSIS — T83098A Other mechanical complication of other indwelling urethral catheter, initial encounter: Secondary | ICD-10-CM | POA: Insufficient documentation

## 2015-07-28 DIAGNOSIS — N4889 Other specified disorders of penis: Secondary | ICD-10-CM | POA: Diagnosis present

## 2015-07-28 DIAGNOSIS — Z8701 Personal history of pneumonia (recurrent): Secondary | ICD-10-CM | POA: Diagnosis not present

## 2015-07-28 DIAGNOSIS — Z8739 Personal history of other diseases of the musculoskeletal system and connective tissue: Secondary | ICD-10-CM | POA: Diagnosis not present

## 2015-07-28 DIAGNOSIS — Z87448 Personal history of other diseases of urinary system: Secondary | ICD-10-CM | POA: Insufficient documentation

## 2015-07-28 DIAGNOSIS — Z872 Personal history of diseases of the skin and subcutaneous tissue: Secondary | ICD-10-CM | POA: Insufficient documentation

## 2015-07-28 DIAGNOSIS — Z8659 Personal history of other mental and behavioral disorders: Secondary | ICD-10-CM | POA: Insufficient documentation

## 2015-07-28 DIAGNOSIS — Z862 Personal history of diseases of the blood and blood-forming organs and certain disorders involving the immune mechanism: Secondary | ICD-10-CM | POA: Insufficient documentation

## 2015-07-28 DIAGNOSIS — Z8669 Personal history of other diseases of the nervous system and sense organs: Secondary | ICD-10-CM | POA: Diagnosis not present

## 2015-07-28 DIAGNOSIS — Z880 Allergy status to penicillin: Secondary | ICD-10-CM | POA: Diagnosis not present

## 2015-07-28 DIAGNOSIS — Y658 Other specified misadventures during surgical and medical care: Secondary | ICD-10-CM | POA: Insufficient documentation

## 2015-07-28 DIAGNOSIS — Z79899 Other long term (current) drug therapy: Secondary | ICD-10-CM | POA: Diagnosis not present

## 2015-07-28 MED ORDER — BACLOFEN 10 MG PO TABS
10.0000 mg | ORAL_TABLET | Freq: Once | ORAL | Status: AC
  Administered 2015-07-28: 10 mg via ORAL
  Filled 2015-07-28: qty 1

## 2015-07-28 MED ORDER — OXYCODONE HCL 5 MG PO TABS
15.0000 mg | ORAL_TABLET | Freq: Once | ORAL | Status: AC
  Administered 2015-07-28: 15 mg via ORAL
  Filled 2015-07-28: qty 3

## 2015-07-28 MED ORDER — OXYBUTYNIN CHLORIDE 5 MG PO TABS
5.0000 mg | ORAL_TABLET | Freq: Once | ORAL | Status: AC
  Administered 2015-07-28: 5 mg via ORAL
  Filled 2015-07-28: qty 1
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
## 2015-07-28 NOTE — ED Notes (Signed)
[facility] here to transport pt back to the NH facility. 
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
## 2015-07-28 NOTE — ED Notes (Signed)
Bed: ZO10 Expected date:  Expected time:  Means of arrival:  Comments: EMS- leaking catheter

## 2015-07-28 NOTE — ED Notes (Signed)
[facility] called to take pt back to his SNF.  It is stated that there is a long wait for [facility] transport.

## 2015-07-28 NOTE — ED Notes (Signed)
Bed: WA12 Expected date:  Expected time:  Means of arrival:  Comments: Hold for hall B  

## 2015-07-28 NOTE — ED Notes (Signed)
Foley bag changed to ED foley bag as requested by Dr. Radford Pax.  Urine is draining from catheter into bag without any problem.

## 2015-07-28 NOTE — ED Provider Notes (Signed)
Patient seen due to leakage of urine around his suprapubic catheter. On examination patient did have some that appear to be urine around the catheter. I deflated and then insufflated again with saline. He has good urine drainage from the back. He was monitored and no further drainage was noted from around the catheter site. Encouraged to follow-up with his urologist  Lorre Nick, MD 07/28/15 2031

## 2015-07-28 NOTE — ED Notes (Signed)
Report given to French Ana, LPN at [facility].  Pt has been updated and awaiting [facility] to arrive to transport him back to facility.

## 2015-08-01 ENCOUNTER — Inpatient Hospital Stay (HOSPITAL_COMMUNITY)
Admission: EM | Admit: 2015-08-01 | Discharge: 2015-08-05 | DRG: 698 | Disposition: A | Discharge status: Home / Self Care | Payer: Medicare Other | Attending: Internal Medicine | Admitting: Internal Medicine

## 2015-08-01 ENCOUNTER — Encounter (HOSPITAL_COMMUNITY): Payer: Self-pay | Admitting: Emergency Medicine

## 2015-08-01 ENCOUNTER — Emergency Department (HOSPITAL_COMMUNITY): Payer: Medicare Other

## 2015-08-01 DIAGNOSIS — A419 Sepsis, unspecified organism: Secondary | ICD-10-CM | POA: Diagnosis present

## 2015-08-01 DIAGNOSIS — R4182 Altered mental status, unspecified: Secondary | ICD-10-CM | POA: Diagnosis not present

## 2015-08-01 DIAGNOSIS — N319 Neuromuscular dysfunction of bladder, unspecified: Secondary | ICD-10-CM | POA: Diagnosis present

## 2015-08-01 DIAGNOSIS — E876 Hypokalemia: Secondary | ICD-10-CM | POA: Diagnosis not present

## 2015-08-01 DIAGNOSIS — Y848 Other medical procedures as the cause of abnormal reaction of the patient, or of later complication, without mention of misadventure at the time of the procedure: Secondary | ICD-10-CM | POA: Diagnosis present

## 2015-08-01 DIAGNOSIS — T83518A Infection and inflammatory reaction due to other urinary catheter, initial encounter: Secondary | ICD-10-CM | POA: Diagnosis not present

## 2015-08-01 DIAGNOSIS — G9340 Encephalopathy, unspecified: Secondary | ICD-10-CM | POA: Diagnosis present

## 2015-08-01 DIAGNOSIS — R6521 Severe sepsis with septic shock: Secondary | ICD-10-CM | POA: Diagnosis present

## 2015-08-01 DIAGNOSIS — K5641 Fecal impaction: Secondary | ICD-10-CM | POA: Diagnosis present

## 2015-08-01 DIAGNOSIS — K529 Noninfective gastroenteritis and colitis, unspecified: Secondary | ICD-10-CM | POA: Diagnosis present

## 2015-08-01 DIAGNOSIS — Z880 Allergy status to penicillin: Secondary | ICD-10-CM

## 2015-08-01 DIAGNOSIS — D61818 Other pancytopenia: Secondary | ICD-10-CM | POA: Diagnosis not present

## 2015-08-01 DIAGNOSIS — F17210 Nicotine dependence, cigarettes, uncomplicated: Secondary | ICD-10-CM | POA: Diagnosis present

## 2015-08-01 DIAGNOSIS — S14107S Unspecified injury at C7 level of cervical spinal cord, sequela: Secondary | ICD-10-CM

## 2015-08-01 DIAGNOSIS — G8220 Paraplegia, unspecified: Secondary | ICD-10-CM | POA: Diagnosis present

## 2015-08-01 DIAGNOSIS — N390 Urinary tract infection, site not specified: Secondary | ICD-10-CM | POA: Diagnosis present

## 2015-08-01 DIAGNOSIS — B965 Pseudomonas (aeruginosa) (mallei) (pseudomallei) as the cause of diseases classified elsewhere: Secondary | ICD-10-CM | POA: Diagnosis present

## 2015-08-01 MED ORDER — SODIUM CHLORIDE 0.9 % IV BOLUS (SEPSIS)
500.0000 mL | INTRAVENOUS | Status: AC
  Administered 2015-08-02: 500 mL via INTRAVENOUS

## 2015-08-01 MED ORDER — SODIUM CHLORIDE 0.9 % IV BOLUS (SEPSIS)
1000.0000 mL | INTRAVENOUS | Status: AC
  Administered 2015-08-02 (×2): 1000 mL via INTRAVENOUS

## 2015-08-01 MED ORDER — HYDROMORPHONE HCL 1 MG/ML IJ SOLN
1.0000 mg | Freq: Once | INTRAMUSCULAR | Status: AC
  Administered 2015-08-01: 1 mg via INTRAVENOUS
  Filled 2015-08-01: qty 1

## 2015-08-01 MED ORDER — DEXTROSE 5 % IV SOLN
2.0000 g | Freq: Once | INTRAVENOUS | Status: AC
  Administered 2015-08-01: 2 g via INTRAVENOUS
  Filled 2015-08-01: qty 2
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
## 2015-08-01 NOTE — ED Notes (Signed)
Pt arrives by [facility] from [facility] with c/o of bilateral flank pain, nausea, and lethargy all day. Pt has hx of UTIs and sepsis. Facility drew labs and gave 1g of Rocepin IM. Pt refused imaging and called EMS. EMS placed 22g in left AC and 24g in right AC. bolus given. Initial BP 66/40, last BP 91/63. Temporal temp 99.

## 2015-08-02 ENCOUNTER — Encounter (HOSPITAL_COMMUNITY): Payer: Self-pay

## 2015-08-02 ENCOUNTER — Inpatient Hospital Stay (HOSPITAL_COMMUNITY): Payer: Medicare Other

## 2015-08-02 DIAGNOSIS — Z880 Allergy status to penicillin: Secondary | ICD-10-CM | POA: Diagnosis not present

## 2015-08-02 DIAGNOSIS — G9340 Encephalopathy, unspecified: Secondary | ICD-10-CM | POA: Diagnosis present

## 2015-08-02 DIAGNOSIS — A419 Sepsis, unspecified organism: Secondary | ICD-10-CM | POA: Diagnosis present

## 2015-08-02 DIAGNOSIS — R6521 Severe sepsis with septic shock: Secondary | ICD-10-CM | POA: Diagnosis not present

## 2015-08-02 DIAGNOSIS — B965 Pseudomonas (aeruginosa) (mallei) (pseudomallei) as the cause of diseases classified elsewhere: Secondary | ICD-10-CM | POA: Diagnosis present

## 2015-08-02 DIAGNOSIS — Y848 Other medical procedures as the cause of abnormal reaction of the patient, or of later complication, without mention of misadventure at the time of the procedure: Secondary | ICD-10-CM | POA: Diagnosis present

## 2015-08-02 DIAGNOSIS — G8220 Paraplegia, unspecified: Secondary | ICD-10-CM | POA: Diagnosis present

## 2015-08-02 DIAGNOSIS — N390 Urinary tract infection, site not specified: Secondary | ICD-10-CM

## 2015-08-02 DIAGNOSIS — T83518A Infection and inflammatory reaction due to other urinary catheter, initial encounter: Secondary | ICD-10-CM | POA: Diagnosis present

## 2015-08-02 DIAGNOSIS — K5641 Fecal impaction: Secondary | ICD-10-CM | POA: Diagnosis present

## 2015-08-02 DIAGNOSIS — D61818 Other pancytopenia: Secondary | ICD-10-CM | POA: Diagnosis not present

## 2015-08-02 DIAGNOSIS — K529 Noninfective gastroenteritis and colitis, unspecified: Secondary | ICD-10-CM | POA: Diagnosis present

## 2015-08-02 DIAGNOSIS — N319 Neuromuscular dysfunction of bladder, unspecified: Secondary | ICD-10-CM | POA: Diagnosis present

## 2015-08-02 DIAGNOSIS — F17210 Nicotine dependence, cigarettes, uncomplicated: Secondary | ICD-10-CM | POA: Diagnosis present

## 2015-08-02 DIAGNOSIS — R4182 Altered mental status, unspecified: Secondary | ICD-10-CM | POA: Diagnosis present

## 2015-08-02 DIAGNOSIS — E876 Hypokalemia: Secondary | ICD-10-CM | POA: Diagnosis not present

## 2015-08-02 DIAGNOSIS — S14107S Unspecified injury at C7 level of cervical spinal cord, sequela: Secondary | ICD-10-CM | POA: Diagnosis not present

## 2015-08-02 LAB — GLUCOSE, CAPILLARY: GLUCOSE-CAPILLARY: 101 mg/dL — AB (ref 65–99)

## 2015-08-02 LAB — URINE MICROSCOPIC-ADD ON

## 2015-08-02 LAB — COMPREHENSIVE METABOLIC PANEL
ALBUMIN: 2.6 g/dL — AB (ref 3.5–5.0)
ALK PHOS: 114 U/L (ref 38–126)
ALT: 31 U/L (ref 17–63)
AST: 51 U/L — AB (ref 15–41)
Anion gap: 13 (ref 5–15)
BILIRUBIN TOTAL: 1 mg/dL (ref 0.3–1.2)
BUN: 17 mg/dL (ref 6–20)
CALCIUM: 8.2 mg/dL — AB (ref 8.9–10.3)
CO2: 19 mmol/L — ABNORMAL LOW (ref 22–32)
CREATININE: 1.08 mg/dL (ref 0.61–1.24)
Chloride: 106 mmol/L (ref 101–111)
GFR calc Af Amer: >60 mL/min (ref >60)
GLUCOSE: 106 mg/dL — AB (ref 65–99)
POTASSIUM: 4.6 mmol/L (ref 3.5–5.1)
Sodium: 138 mmol/L (ref 135–145)
TOTAL PROTEIN: 6.5 g/dL (ref 6.5–8.1)

## 2015-08-02 LAB — CBC WITH DIFFERENTIAL/PLATELET
BASOS ABS: 0 10*3/uL (ref 0.0–0.1)
BASOS PCT: 0 %
EOS ABS: 0 10*3/uL (ref 0.0–0.7)
EOS PCT: 1 %
HCT: 36.2 % — ABNORMAL LOW (ref 39.0–52.0)
Hemoglobin: 11.9 g/dL — ABNORMAL LOW (ref 13.0–17.0)
LYMPHS PCT: 21 %
Lymphs Abs: 1.7 10*3/uL (ref 0.7–4.0)
MCH: 27.2 pg (ref 26.0–34.0)
MCHC: 32.9 g/dL (ref 30.0–36.0)
MCV: 82.8 fL (ref 78.0–100.0)
MONO ABS: 1.1 10*3/uL — AB (ref 0.1–1.0)
Monocytes Relative: 14 %
Neutro Abs: 5 10*3/uL (ref 1.7–7.7)
Neutrophils Relative %: 64 %
PLATELETS: 232 10*3/uL (ref 150–400)
RBC: 4.37 MIL/uL (ref 4.22–5.81)
RDW: 14.7 % (ref 11.5–15.5)
WBC: 7.8 10*3/uL (ref 4.0–10.5)

## 2015-08-02 LAB — CBC
HEMATOCRIT: 30.8 % — AB (ref 39.0–52.0)
Hemoglobin: 10.1 g/dL — ABNORMAL LOW (ref 13.0–17.0)
MCH: 27.2 pg (ref 26.0–34.0)
MCHC: 32.8 g/dL (ref 30.0–36.0)
MCV: 82.8 fL (ref 78.0–100.0)
PLATELETS: 200 10*3/uL (ref 150–400)
RBC: 3.72 MIL/uL — AB (ref 4.22–5.81)
RDW: 14.6 % (ref 11.5–15.5)
WBC: 6.4 10*3/uL (ref 4.0–10.5)

## 2015-08-02 LAB — URINALYSIS, ROUTINE W REFLEX MICROSCOPIC
Glucose, UA: NEGATIVE mg/dL
Ketones, ur: 15 mg/dL — AB
NITRITE: POSITIVE — AB
Protein, ur: 30 mg/dL — AB
SPECIFIC GRAVITY, URINE: 1.02 (ref 1.005–1.030)
pH: 7 (ref 5.0–8.0)

## 2015-08-02 LAB — I-STAT CG4 LACTIC ACID, ED
LACTIC ACID, VENOUS: 0.57 mmol/L (ref 0.5–2.0)
Lactic Acid, Venous: 1.67 mmol/L (ref 0.5–2.0)

## 2015-08-02 LAB — MRSA PCR SCREENING: MRSA BY PCR: POSITIVE — AB

## 2015-08-02 LAB — I-STAT CHEM 8, ED
BUN: 20 mg/dL (ref 6–20)
CHLORIDE: 105 mmol/L (ref 101–111)
Calcium, Ion: 1.02 mmol/L — ABNORMAL LOW (ref 1.12–1.23)
Creatinine, Ser: 1 mg/dL (ref 0.61–1.24)
Glucose, Bld: 99 mg/dL (ref 65–99)
HEMATOCRIT: 40 % (ref 39.0–52.0)
Hemoglobin: 13.6 g/dL (ref 13.0–17.0)
POTASSIUM: 4.5 mmol/L (ref 3.5–5.1)
SODIUM: 140 mmol/L (ref 135–145)
TCO2: 23 mmol/L (ref 0–100)

## 2015-08-02 LAB — BASIC METABOLIC PANEL
ANION GAP: 10 (ref 5–15)
BUN: 15 mg/dL (ref 6–20)
CHLORIDE: 113 mmol/L — AB (ref 101–111)
CO2: 20 mmol/L — ABNORMAL LOW (ref 22–32)
Calcium: 7.5 mg/dL — ABNORMAL LOW (ref 8.9–10.3)
Creatinine, Ser: 0.84 mg/dL (ref 0.61–1.24)
Glucose, Bld: 111 mg/dL — ABNORMAL HIGH (ref 65–99)
POTASSIUM: 4.1 mmol/L (ref 3.5–5.1)
SODIUM: 143 mmol/L (ref 135–145)

## 2015-08-02 LAB — PHOSPHORUS: PHOSPHORUS: 2.4 mg/dL — AB (ref 2.5–4.6)

## 2015-08-02 LAB — CORTISOL: Cortisol, Plasma: 86.6 ug/dL

## 2015-08-02 LAB — MAGNESIUM: Magnesium: 1.5 mg/dL — ABNORMAL LOW (ref 1.7–2.4)

## 2015-08-02 LAB — PROCALCITONIN: Procalcitonin: 0.32 ng/mL

## 2015-08-02 MED ORDER — OXYBUTYNIN CHLORIDE 5 MG PO TABS
5.0000 mg | ORAL_TABLET | Freq: Four times a day (QID) | ORAL | Status: DC
  Administered 2015-08-02 – 2015-08-05 (×13): 5 mg via ORAL
  Filled 2015-08-02 (×15): qty 1

## 2015-08-02 MED ORDER — SODIUM CHLORIDE 0.9 % IV BOLUS (SEPSIS)
1000.0000 mL | Freq: Once | INTRAVENOUS | Status: AC
  Administered 2015-08-02: 1000 mL via INTRAVENOUS

## 2015-08-02 MED ORDER — SODIUM CHLORIDE 0.9 % IV SOLN: 250.0000 mL | INTRAVENOUS | Status: DC | PRN

## 2015-08-02 MED ORDER — CHLORHEXIDINE GLUCONATE CLOTH 2 % EX PADS
6.0000 | MEDICATED_PAD | Freq: Every day | CUTANEOUS | Status: DC
  Administered 2015-08-02 – 2015-08-05 (×2): 6 via TOPICAL

## 2015-08-02 MED ORDER — NOREPINEPHRINE BITARTRATE 1 MG/ML IV SOLN
2.0000 ug/min | INTRAVENOUS | Status: DC
  Administered 2015-08-02: 2 ug/min via INTRAVENOUS

## 2015-08-02 MED ORDER — BACLOFEN 10 MG PO TABS
10.0000 mg | ORAL_TABLET | Freq: Four times a day (QID) | ORAL | Status: DC
  Administered 2015-08-02 – 2015-08-05 (×13): 10 mg via ORAL
  Filled 2015-08-02 (×15): qty 1

## 2015-08-02 MED ORDER — HYDROCORTISONE NA SUCCINATE PF 100 MG IJ SOLR
50.0000 mg | Freq: Four times a day (QID) | INTRAMUSCULAR | Status: DC
  Administered 2015-08-02 – 2015-08-03 (×5): 50 mg via INTRAVENOUS
  Filled 2015-08-02 (×5): qty 2

## 2015-08-02 MED ORDER — PANTOPRAZOLE SODIUM 40 MG IV SOLR: 40.0000 mg | Freq: Every day | INTRAVENOUS | Status: DC

## 2015-08-02 MED ORDER — SODIUM CHLORIDE 0.9 % IV SOLN
INTRAVENOUS | Status: DC
  Administered 2015-08-02 – 2015-08-04 (×2): via INTRAVENOUS

## 2015-08-02 MED ORDER — OXYCODONE HCL 5 MG PO TABS
15.0000 mg | ORAL_TABLET | ORAL | Status: DC | PRN
  Administered 2015-08-02 – 2015-08-05 (×14): 15 mg via ORAL
  Filled 2015-08-02 (×14): qty 3

## 2015-08-02 MED ORDER — IOHEXOL 300 MG/ML  SOLN
100.0000 mL | Freq: Once | INTRAMUSCULAR | Status: AC | PRN
  Administered 2015-08-02: 100 mL via INTRAVENOUS

## 2015-08-02 MED ORDER — HYDROCODONE-ACETAMINOPHEN 5-325 MG PO TABS: 1.0000 | ORAL_TABLET | ORAL | Status: DC | PRN

## 2015-08-02 MED ORDER — ROPINIROLE HCL 1 MG PO TABS
0.5000 mg | ORAL_TABLET | Freq: Every day | ORAL | Status: DC
  Administered 2015-08-03 – 2015-08-04 (×2): 0.5 mg via ORAL
  Filled 2015-08-02 (×3): qty 1

## 2015-08-02 MED ORDER — PREGABALIN 100 MG PO CAPS
100.0000 mg | ORAL_CAPSULE | Freq: Two times a day (BID) | ORAL | Status: DC
  Administered 2015-08-02 – 2015-08-05 (×7): 100 mg via ORAL
  Filled 2015-08-02 (×7): qty 1

## 2015-08-02 MED ORDER — DEXTROSE 5 % IV SOLN
2.0000 g | Freq: Three times a day (TID) | INTRAVENOUS | Status: DC
  Administered 2015-08-02 – 2015-08-03 (×4): 2 g via INTRAVENOUS
  Filled 2015-08-02 (×7): qty 2

## 2015-08-02 MED ORDER — NOREPINEPHRINE BITARTRATE 1 MG/ML IV SOLN
5.0000 ug/min | INTRAVENOUS | Status: DC
  Filled 2015-08-02: qty 4

## 2015-08-02 MED ORDER — HEPARIN SODIUM (PORCINE) 5000 UNIT/ML IJ SOLN
5000.0000 [IU] | Freq: Three times a day (TID) | INTRAMUSCULAR | Status: DC
  Administered 2015-08-02: 5000 [IU] via SUBCUTANEOUS
  Filled 2015-08-02 (×2): qty 1

## 2015-08-02 MED ORDER — MUPIROCIN 2 % EX OINT
1.0000 "application " | TOPICAL_OINTMENT | Freq: Two times a day (BID) | CUTANEOUS | Status: DC
  Administered 2015-08-02 – 2015-08-05 (×5): 1 via NASAL
  Filled 2015-08-02 (×3): qty 22

## 2015-08-02 NOTE — ED Notes (Signed)
Pt's BPs recording low. Checked manual and got 71/42. Notified MD who ordered another bolus.

## 2015-08-02 NOTE — H&P (Signed)
PULMONARY / CRITICAL CARE MEDICINE   Name: Alan Warner MRN: [id number] DOB: Nov 19, 1960    ADMISSION DATE:  08/01/2015 CONSULTATION DATE:  08/02/2015  REFERRING MD:  Mora Bellman, MD  CHIEF COMPLAINT:  Altered Mental Status  HISTORY OF PRESENT ILLNESS:  Pt is encephelopathic; therefore, this HPI is mostly obtained from chart review. Alan Warner is a 55 y.o. male with PMH as outlined below. Pt. Is 55 y/o M with hx significant for neurogenic bladder, chronic suprapubic catheter with history of recurrent urinary tract infection, paraplegia, and resides in a SNF. He had apparently become more altered / confused over the course of the day today while experiencing fatigue, nausea, and vomiting with abdominal pain. He says his symptoms are consistent with previous UTI. He was found to be hypotensive by EMS to 66/40 and initially was able to maintain BP's here, but became increasingly hypotensive requiring pressors for blood pressure support.   Urinalysis reflective of infection at this time, with hypotension code sepsis was called. ICU consulted for management as he was requiring pressors for blood pressure support. Additionally, given dilaudid in the ED may be contributing to AMS / Lethargy.   PAST MEDICAL HISTORY :  He  has a past medical history of Immobile, syndrome paraplegic; Paralysis (HCC) (c6-7 quad); Sacral decubitus ulcer (10/05/2014); Staphylococcal pneumonia (HCC); Heparin induced thrombocytopenia (HCC); Neurogenic bladder; Osteomyelitis (HCC); Psychosis; and Pancytopenia (HCC).  PAST SURGICAL HISTORY: He  has past surgical history that includes Hip arthrotomy (Right) and Small intestine surgery.  Allergies  Allergen Reactions  . Levaquin [Levofloxacin In D5w] Other (See Comments)    PER MEDICATION MAR  . Penicillins Other (See Comments)    PER MEDICATION MAR  . Vancomycin Rash    PER MEDICATION MAR    No current facility-administered medications on file prior to encounter.   Current  Outpatient Prescriptions on File Prior to Encounter  Medication Sig  . acetaminophen (TYLENOL) 325 MG tablet Take 650 mg by mouth every 4 (four) hours as needed for pain or fever.  Marland Kitchen antiseptic oral rinse (BIOTENE) LIQD 15 mLs by Mouth Rinse route daily as needed for dry mouth.   . baclofen (LIORESAL) 10 MG tablet Take 10 mg by mouth 4 (four) times daily.   . bisacodyl (DULCOLAX) 10 MG suppository Place 10 mg rectally daily as needed for moderate constipation.  . diphenhydrAMINE (BENADRYL) 25 MG tablet Take 25 mg by mouth every 4 (four) hours as needed for itching.  . fentaNYL (DURAGESIC - DOSED MCG/HR) 75 MCG/HR Place 1 patch (75 mcg total) onto the skin every 3 (three) days. APPLY WITH 50 MCG PATCH FOR A TOTAL OF 125 MCG (Patient taking differently: Place 75 mcg onto the skin every 3 (three) days. )  . guaiFENesin-dextromethorphan (ROBITUSSIN DM) 100-10 MG/5ML syrup Take 5 mLs by mouth every 4 (four) hours as needed for cough.  Marland Kitchen HYDROcodone-acetaminophen (NORCO/VICODIN) 5-325 MG tablet Take 1 tablet by mouth every 4 (four) hours as needed for moderate pain.  Marland Kitchen ipratropium-albuterol (DUONEB) 0.5-2.5 (3) MG/3ML SOLN Take 3 mLs by nebulization 3 (three) times daily. (Patient taking differently: Take 3 mLs by nebulization every 4 (four) hours as needed (for shortness of breath). )  . LORazepam (ATIVAN) 0.5 MG tablet Take 1 tablet (0.5 mg total) by mouth every 6 (six) hours as needed for anxiety.  Marland Kitchen oxybutynin (DITROPAN) 5 MG tablet Take 5 mg by mouth 4 (four) times daily.  Marland Kitchen oxyCODONE (ROXICODONE) 15 MG immediate release tablet Take 1 tablet (15  mg total) by mouth every 6 (six) hours as needed for pain. (Patient taking differently: Take 15 mg by mouth every 4 (four) hours. )  . polyethylene glycol (MIRALAX / GLYCOLAX) packet Take 17 g by mouth daily.   . potassium chloride (K-DUR,KLOR-CON) 10 MEQ tablet Take 10 mEq by mouth 2 (two) times daily.  . pregabalin (LYRICA) 100 MG capsule Take 100 mg by  mouth 2 (two) times daily.  Marland Kitchen rOPINIRole (REQUIP) 0.5 MG tablet Take 0.5 mg by mouth at bedtime.  . simethicone (MYLICON) 125 MG chewable tablet Chew 125 mg by mouth 4 (four) times daily - after meals and at bedtime.   Marland Kitchen tiZANidine (ZANAFLEX) 4 MG tablet Take 4 mg by mouth every 4 (four) hours as needed for muscle spasms.     FAMILY HISTORY:  His has no family status information on file.   SOCIAL HISTORY: He  reports that he has been smoking Cigarettes.  He has been smoking about 0.50 packs per day. He does not have any smokeless tobacco history on file. He reports that he uses illicit drugs (Marijuana). He reports that he does not drink alcohol.  REVIEW OF SYSTEMS:   ROS per HPI, unable to obtain full ROS due to altered mental status.   SUBJECTIVE:  Per HPI.   VITAL SIGNS: BP 125/80 mmHg  Pulse 75  Temp(Src) 99 F (37.2 C) (Rectal)  Resp 26  SpO2 96%  HEMODYNAMICS:    VENTILATOR SETTINGS:    INTAKE / OUTPUT:    PHYSICAL EXAMINATION: General: somewhat lethargic, in NAD on pressors. Neuro: AAOx2, paraplegia, PERRLA.  HEENT: [place]/AT. PERRL, sclerae anicteric. No JVD Cardiovascular: RRR, no M/R/G. 2+ distal pulses Lungs: Respirations even and unlabored.  CTA bilaterally, appropriate rate.  Abdomen: BS x 4, soft, Tender in lower abdominal quadrants, Nondistended.  Musculoskeletal: No gross deformities, no edema.  Skin: Intact, warm, no rashes.  LABS:  BMET  Recent Labs Lab 08/01/15 2345 08/02/15 0027  NA 138 140  K 4.6 4.5  CL 106 105  CO2 19*  --   BUN 17 20  CREATININE 1.08 1.00  GLUCOSE 106* 99    Electrolytes  Recent Labs Lab 08/01/15 2345  CALCIUM 8.2*    CBC  Recent Labs Lab 08/01/15 2345 08/02/15 0027  WBC 7.8  --   HGB 11.9* 13.6  HCT 36.2* 40.0  PLT 232  --     Coag's No results for input(s): APTT, INR in the last 168 hours.  Sepsis Markers  Recent Labs Lab 08/02/15 0003  LATICACIDVEN 1.67    ABG No results for input(s):  PHART, PCO2ART, PO2ART in the last 168 hours.  Liver Enzymes  Recent Labs Lab 08/01/15 2345  AST 51*  ALT 31  ALKPHOS 114  BILITOT 1.0  ALBUMIN 2.6*    Cardiac Enzymes No results for input(s): TROPONINI, PROBNP in the last 168 hours.  Glucose No results for input(s): GLUCAP in the last 168 hours.  Imaging Dg Chest Port 1 View  08/02/2015  CLINICAL DATA:  Weakness and hypotension. Bilateral flank pain, nausea, and lethargy all day. EXAM: PORTABLE CHEST 1 VIEW COMPARISON:  04/26/2015 FINDINGS: Thoracic scoliosis convex towards the right. Postoperative changes in the cervical spine. Normal heart size and pulmonary vascularity. No focal airspace disease or consolidation in the lungs. No blunting of costophrenic angles. No pneumothorax. Mediastinal contours appear intact. Degenerative changes in the left shoulder. IMPRESSION: No active disease. Electronically Signed   By: Burman Nieves M.D.   On:  08/02/2015 00:05     STUDIES:  CXR 2/16 > No focal disease, clear.   CULTURES: Blood Cx x 2 > pending Urine Cx > pending  ANTIBIOTICS: Cefepime 2/16 >  Ceftriaxone 2/16 > one dose  SIGNIFICANT EVENTS: 2/16 > Admitted with sepsis urinary source.   LINES/TUBES: PIV x 1   DISCUSSION: 55 y/o M with PMH as above, likely recurrent UTI now with sepsis. CCM consulted for sepsis management.   ASSESSMENT / PLAN: INFECTIOUS A:   Urinary Tract Sepsis 2/2 Chronic Indwelling Foley Sacral Decubitus Ulcer P:   Abx Cefepime for now > Most recent UTI proteus and sensitive.  Follow cultures as above. PCT algorithm to limit abx exposure. Broaden abx as needed if decompensating.  Trend lactate.   PULMONARY A: AMS - concern for airway protection Otherwise stable. No issues.  P:   Monitor mental status Respiratory status appropriate at this time.  No sedating medications.  CARDIOVASCULAR A:  Septic Shock P:  Fluid resuscitation with NS  Follow lactic acid Telemetry  monitoring Pressor support with Levo as needed for MAP goal 60 Wean pressors as able.  Steroids.   RENAL A:   UTI Chronic Indwelling Foley 2/2 Neurogenic bladder  Neurogenic Bladder 2/2 spinal cord injury 2005 P:   NS @ 75 cc/hr.  S/p bolus x 3 BMP in AM. Restart home oxybutynin when able to take po Potassium supplementation at home  BID - watch Lytes replete as needed.   GASTROINTESTINAL A:   Hx of Stercoral colitis / fecal impaction GI prophylaxis. Nutrition.  Mild Transaminitis P:   Goal daily BM Dulcolax / Miralax as needed.  Avoid narcotics as able.  SUP: Pantoprazole. NPO. Tube feeds / PO as able Repeat LFT's tomorrow.   HEMATOLOGIC A:   Stable. No issues.  VTE Prophylaxis. P:  Trend WBC SCD's / Hep CBC in AM.  ENDOCRINE A:   Stable No acute issues. Concern for relative adrenal insufficiency.  P:   SSI. Solucortef  q 6.  Assess Cortisol Activity   NEUROLOGIC A:   Altered / Lethargic - received Dilaudid in the ED. Also with septic shock.  Paraplegia Neurogenic Bladder P:   Sedation:  None RASS goal: 0  Daily WUA. CT head without contrast Avoid all sedating meds d/c narcotics / benzos from home.  If progression then > TSH, Ammonia, TME workup.    Family updated: none available.   Interdisciplinary Family Meeting v Palliative Care Meeting:  Due by: 08/08/2014  Devota Pace, MD Resident - PGY 2   Attending Note:  I have examined patient, reviewed labs, studies and notes. I have discussed the case with Dr Jaquita Rector, and I agree with the data and plans as amended above.  Pt has chronic debilitation with paraplegia and chronic suprapubic catheter, presented with lethargy, abd pain, nausea and emesis, evidence for UTI on UA. In ED was in shock and started initially on norepi. On my eval he is waking up, has improved hemodynamically after IVF and stress dose steroids. We will continue his abx, IVF resuscitation, wean stress dose steroids  over next few days. Follow cx's. Will defer CVC placement for now. Possibly out to SDU later today.  Independent critical care time is 40 minutes.   Levy Pupa, MD, PhD 08/02/2015, 5:52 AM [facility] [phone] or if no answer [phone]

## 2015-08-02 NOTE — Progress Notes (Signed)
UR Completed. Skylynne Schlechter, RN, BSN.  [phone] 

## 2015-08-02 NOTE — Progress Notes (Signed)
Pharmacy Antibiotic Alan Warner is a 55 y.o. male admitted on 08/01/2015 with sepsis.  Pharmacy has been consulted for Cefepime dosing. Likely urinary source. WBC WNL. Renal function good.   Plan: Cefepime 2g IV q8h Trend WBC, temp, renal function F/U urine cultures  Temp (24hrs), Avg:99 F (37.2 C), Min:99 F (37.2 C), Max:99 F (37.2 C)   Recent Labs Lab 08/01/15 2345 08/02/15 0003 08/02/15 0027  WBC 7.8  --   --   CREATININE 1.08  --  1.00  LATICACIDVEN  --  1.67  --     CrCl cannot be calculated (Unknown ideal weight.).    Allergies  Allergen Reactions  . Levaquin [Levofloxacin In D5w] Other (See Comments)    PER MEDICATION MAR  . Penicillins Other (See Comments)    PER MEDICATION MAR  . Vancomycin Rash    PER MEDICATION MAR    Abran Duke 08/02/2015 4:34 AM

## 2015-08-02 NOTE — Progress Notes (Signed)
Pharmacy Antibiotic Note  Alan Warner is a 55 y.o. male admitted on 08/01/2015 with probable UTI.  Pharmacy has been consulted for cefepime dosing.  Plan: The dose of cefepime 2 gm IV q8h will be adjusted to cefepime 1 gm IV q12h based on renal function. Pt is a paraplegic so CrCl is most likely falsely high.   F/u cx, CBC, SCr, clinical course  Height:  (188 cm) Weight: 165 lb 12.6 oz (75.2 kg) IBW/kg (Calculated) : 82.2  Temp (24hrs), Avg:98.6 F (37 C), Min:98.2 F (36.8 C), Max:99 F (37.2 C)   Recent Labs Lab 08/01/15 2345 08/02/15 0003 08/02/15 0027 08/02/15 0433 08/02/15 0439  WBC 7.8  --   --   --  6.4  CREATININE 1.08  --  1.00  --  0.84  LATICACIDVEN  --  1.67  --  0.57  --     Estimated Creatinine Clearance: 109.4 mL/min (by C-G formula based on Cr of 0.84).    Allergies  Allergen Reactions  . Levaquin [Levofloxacin In D5w] Other (See Comments)    PER MEDICATION MAR  . Penicillins Other (See Comments)    PER MEDICATION MAR  . Vancomycin Rash    PER MEDICATION MAR    Antimicrobials this admission: Cefepime 2/16 >>  Microbiology results: UA: amber, turbid, +nitrite, large leuk, many bacteria MRSA PCR + 2/16 UCx: sent 2/16 BCx: sent  Thank you for allowing pharmacy to be a part of this patient's care.  Alysse Rathe L. Roseanne Reno, PharmD PGY2 Infectious Diseases Pharmacy Resident Pager: [phone] 08/02/2015 12:57 PM

## 2015-08-02 NOTE — Progress Notes (Signed)
Received from 2nd floor to room 04 A&Ox4 paraplegic voices no c/o.has 120$ in cash at his side to give to dtr later this pm

## 2015-08-03 LAB — CBC
HCT: 28.7 % — ABNORMAL LOW (ref 39.0–52.0)
Hemoglobin: 9.1 g/dL — ABNORMAL LOW (ref 13.0–17.0)
MCH: 25.9 pg — ABNORMAL LOW (ref 26.0–34.0)
MCHC: 31.7 g/dL (ref 30.0–36.0)
MCV: 81.5 fL (ref 78.0–100.0)
Platelets: 165 K/uL (ref 150–400)
RBC: 3.52 MIL/uL — ABNORMAL LOW (ref 4.22–5.81)
RDW: 14.4 % (ref 11.5–15.5)
WBC: 3 K/uL — ABNORMAL LOW (ref 4.0–10.5)

## 2015-08-03 LAB — COMPREHENSIVE METABOLIC PANEL
ALBUMIN: 2.3 g/dL — AB (ref 3.5–5.0)
ALT: 19 U/L (ref 17–63)
AST: 20 U/L (ref 15–41)
Alkaline Phosphatase: 78 U/L (ref 38–126)
Anion gap: 8 (ref 5–15)
BILIRUBIN TOTAL: 0.6 mg/dL (ref 0.3–1.2)
BUN: 10 mg/dL (ref 6–20)
CHLORIDE: 113 mmol/L — AB (ref 101–111)
CO2: 19 mmol/L — ABNORMAL LOW (ref 22–32)
CREATININE: 0.61 mg/dL (ref 0.61–1.24)
Calcium: 7.9 mg/dL — ABNORMAL LOW (ref 8.9–10.3)
GFR calc Af Amer: >60 mL/min (ref >60)
GFR calc non Af Amer: >60 mL/min (ref >60)
GLUCOSE: 122 mg/dL — AB (ref 65–99)
POTASSIUM: 3.7 mmol/L (ref 3.5–5.1)
Sodium: 140 mmol/L (ref 135–145)
TOTAL PROTEIN: 6.1 g/dL — AB (ref 6.5–8.1)

## 2015-08-03 LAB — PROCALCITONIN: PROCALCITONIN: 0.19 ng/mL

## 2015-08-03 MED ORDER — LORAZEPAM 0.5 MG PO TABS
0.5000 mg | ORAL_TABLET | Freq: Four times a day (QID) | ORAL | Status: DC | PRN
  Administered 2015-08-03 (×2): 0.5 mg via ORAL
  Filled 2015-08-03 (×2): qty 1

## 2015-08-03 MED ORDER — IPRATROPIUM-ALBUTEROL 0.5-2.5 (3) MG/3ML IN SOLN: 3.0000 mL | RESPIRATORY_TRACT | Status: DC | PRN

## 2015-08-03 MED ORDER — POLYETHYLENE GLYCOL 3350 17 G PO PACK
17.0000 g | PACK | Freq: Every day | ORAL | Status: DC
  Administered 2015-08-03: 17 g via ORAL
  Filled 2015-08-03: qty 1

## 2015-08-03 MED ORDER — FENTANYL 50 MCG/HR TD PT72
50.0000 ug | MEDICATED_PATCH | TRANSDERMAL | Status: DC
  Administered 2015-08-03: 50 ug via TRANSDERMAL
  Filled 2015-08-03 (×2): qty 1

## 2015-08-03 MED ORDER — PANTOPRAZOLE SODIUM 40 MG PO TBEC
40.0000 mg | DELAYED_RELEASE_TABLET | Freq: Every day | ORAL | Status: DC
  Administered 2015-08-03: 40 mg via ORAL
  Filled 2015-08-03 (×2): qty 1

## 2015-08-03 MED ORDER — HYDROCORTISONE NA SUCCINATE PF 100 MG IJ SOLR
50.0000 mg | Freq: Two times a day (BID) | INTRAMUSCULAR | Status: DC
  Administered 2015-08-03 – 2015-08-04 (×3): 50 mg via INTRAVENOUS
  Filled 2015-08-03 (×3): qty 2

## 2015-08-03 MED ORDER — MAGNESIUM SULFATE 2 GM/50ML IV SOLN
2.0000 g | Freq: Once | INTRAVENOUS | Status: AC
  Administered 2015-08-03: 2 g via INTRAVENOUS
  Filled 2015-08-03: qty 50

## 2015-08-03 MED ORDER — BISACODYL 10 MG RE SUPP: 10.0000 mg | Freq: Every day | RECTAL | Status: DC | PRN

## 2015-08-03 MED ORDER — DEXTROSE 5 % IV SOLN
1.0000 g | Freq: Two times a day (BID) | INTRAVENOUS | Status: DC
  Administered 2015-08-03 – 2015-08-05 (×4): 1 g via INTRAVENOUS
  Filled 2015-08-03 (×6): qty 1

## 2015-08-03 NOTE — Progress Notes (Signed)
Pt refusing to have tele monitor placed.

## 2015-08-03 NOTE — Progress Notes (Signed)
Patient refusing tele, heparin, and some medications. Madelin Rear, MSN, RN, [employer]

## 2015-08-03 NOTE — Progress Notes (Signed)
[facility] PROGRESS NOTE  Alan Warner ZOX:[id number] DOB: [birth year] DOA: 08/01/2015 PCP: Eloisa Northern, MD  Assessment/Plan: Pt. Is 55 y/o M with hx significant for neurogenic bladder, chronic suprapubic catheter with history of recurrent urinary tract infection, paraplegia, and resides in a SNF. He had apparently become more altered / confused over the course of the day today while experiencing fatigue, nausea, and vomiting with abdominal pain. He says his symptoms are consistent with previous UTI. He was found to be hypotensive by EMS to 66/40 and initially was able to maintain BP's here, but became increasingly hypotensive requiring pressors for blood pressure support.   He was admitted by CCM , he received pressors to help support BP. Sepsis related to UTI. He was transfer [facility] 2[facility].   1-Septic Shock; Source UTI.  Received IV fluids and Levo.  On stress dose steroids. Taper today  Off pressors since 2-16.  On cefepime.  Awaiting urine culture.   2-Encephalopathy: Suspect multifactorial, sepsis, hypotension, polypharmacy  Resolved.  Resume pain medication.   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  UA with too numerous WBC  Follow urine culture.  Cefepime for now > Most recent UTI proteus and sensitive On Oxybutynin   Hx of Stercoral colitis / fecal impaction CT with prominent stool, wall thickening of rectum may indicate colitis or stercoral colitis.  Laxative ordered.  On IV antibiotics.   Has had multiple BM.   Mild Transaminates; follow trend.   Paraplegia  DVT prophylaxis: Heparin.   Code Status: Full Code.  Family Communication: Care discussed with patient.  Disposition Plan: Remain inpatient for treatment of sepsis.    Consultants:  CCM admitted patient.    Procedures:  none  Antibiotics:  Cefepime 2-16  HPI/Subjective: Feeling well, wants to be discharge.  Report multiple BM, denies abdominal pain   Objective: Filed Vitals:   08/03/15 0618 08/03/15 0840  BP: 119/73 117/71  Pulse: 52 70  Temp: 97.9 F (36.6 C)   Resp: 18     Intake/Output Summary (Last 24 hours) at 08/03/15 0858 Last data filed at 08/03/15 8119  Gross per 24 hour  Intake    400 ml  Output   1250 ml  Net   -850 ml   Filed Weights   08/02/15 0500 08/03/15 0618  Weight: 75.2 kg (165 lb 12.6 oz) 80.9 kg (178 lb 5.6 oz)    Exam:   General:  Alert in no distress  Cardiovascular: S 1, S 2 RRR  Respiratory: CTA  Abdomen: BS present, soft, distended.   Musculoskeletal: no edema   Data Reviewed: Basic Metabolic Panel:  Recent Labs Lab 08/01/15 2345 08/02/15 0027 08/02/15 0439 08/03/15 0613  NA 138 140 143 140  K 4.6 4.5 4.1 3.7  CL 106 105 113* 113*  CO2 19*  --  20* 19*  GLUCOSE 106* 99 111* 122*  BUN 17 20 15 10   CREATININE 1.08 1.00 0.84 0.61  CALCIUM 8.2*  --  7.5* 7.9*  MG  --   --  1.5*  --   PHOS  --   --  2.4*  --    Liver Function Tests:  Recent Labs Lab 08/01/15 2345 08/03/15 0613  AST 51* 20  ALT 31 19  ALKPHOS 114 78  BILITOT 1.0 0.6  PROT 6.5 6.1*  ALBUMIN 2.6* 2.3*   No results for  input(s): LIPASE, AMYLASE in the last 168 hours. No results for input(s): AMMONIA in the last 168 hours. CBC:  Recent Labs Lab 08/01/15 2345 08/02/15 0027 08/02/15 0439 08/03/15 0613  WBC 7.8  --  6.4 3.0*  NEUTROABS 5.0  --   --   --   HGB 11.9* 13.6 10.1* 9.1*  HCT 36.2* 40.0 30.8* 28.7*  MCV 82.8  --  82.8 81.5  PLT 232  --  200 165   Cardiac Enzymes: No results for input(s): CKTOTAL, CKMB, CKMBINDEX, TROPONINI in the last 168 hours. BNP (last 3 results)  Recent Labs  10/06/14 0454  BNP 97.5    ProBNP (last 3 results) No results for input(s): PROBNP in the last 8760 hours.  CBG:  Recent Labs Lab  08/02/15 0551  GLUCAP 101*    Recent Results (from the past 240 hour(s))  MRSA PCR Screening     Status: Abnormal   Collection Time: 08/02/15  5:54 AM  Result Value Ref Range Status   MRSA by PCR POSITIVE (A) NEGATIVE Final    Comment:        The GeneXpert MRSA Assay (FDA approved for NASAL specimens only), is one component of a comprehensive MRSA colonization surveillance program. It is not intended to diagnose MRSA infection nor to guide or monitor treatment for MRSA infections. RESULT CALLED TO, READ BACK BY AND VERIFIED WITH: J BISHOP,RN AT 2956 08/02/15 BY L BENFIELD      Studies: Ct Head Wo Contrast  08/02/2015  CLINICAL DATA:  Altered mental status with increased confusion over the course of the day. Fatigue, nausea, vomiting, and abdominal pain. Hypotension. EXAM: CT HEAD WITHOUT CONTRAST TECHNIQUE: Contiguous axial images were obtained from the base of the skull through the vertex without intravenous contrast. COMPARISON:  None. FINDINGS: Ventricles and sulci appear symmetrical. No ventricular dilatation. No mass effect or midline shift. No abnormal extra-axial fluid collections. Gray-white matter junctions are distinct. Basal cisterns are not effaced. No evidence of acute intracranial hemorrhage. No depressed skull fractures. Visualized paranasal sinuses and mastoid air cells are not opacified. IMPRESSION: No acute intracranial abnormalities. Electronically Signed   By: Burman Nieves M.D.   On: 08/02/2015 05:23   Ct Abdomen Pelvis W Contrast  08/02/2015  CLINICAL DATA:  Fatigue, nausea, vomiting, and abdominal pain. History of paraplegia, neurogenic bladder, chronic suprapubic catheter, and recurrent urinary tract infections. Hypotension. EXAM: CT ABDOMEN AND PELVIS WITH CONTRAST TECHNIQUE: Multidetector CT imaging of the abdomen and pelvis was performed using the standard protocol following bolus administration of intravenous contrast. CONTRAST:  OMNIPAQUE IOHEXOL 300  MG/ML  SOLN COMPARISON:  06/28/2015 FINDINGS: Atelectasis in the lung bases. The gallbladder is not visualized and may be contracted or surgically absent. There is prominent intrahepatic bile duct dilatation with mild extrahepatic bile duct dilatation. No obstructing mass lesion is identified. Similar ductal dilatation has been present previously and this may be physiologic. Correlation with liver enzymes is suggested. Pancreas appears somewhat atrophic. Spleen is mildly enlarged. No adrenal gland nodules. Abdominal aorta appears normal. Prominent paracaval lymph nodes without pathologic enlargement, likely reactive. Inferior vena caval filter is present. Kidneys are symmetrical. Multiple stones demonstrated within both kidneys. No hydronephrosis or hydroureter. Stomach and small bowel are decompressed. : Is distended, vertically the rectosigmoid region, and is filled with stool and air consistent with chronic constipation. No free air or free fluid demonstrated in the abdomen. Abdominal wall musculature appears intact. Pelvis: A suprapubic catheter is present deflating the bladder. No pelvic mass  or lymphadenopathy. Rectal wall is diffusely thickened, possibly representing proctitis or possibly stercoral colitis. No free or loculated pelvic fluid collections. Fatty atrophy of the hip muscles. Superior dislocation of the right hip. This is chronic. Small left hip effusion. Infiltration in the subcutaneous fat throughout the hip regions. Focal skin defect and underlying scarring over the lateral right hip probably represents ulceration. Small ulceration over the left side of the sacrum. Soft tissue scarring or edema over the left hip may represent bursitis. Degenerative changes in the left hip. IMPRESSION: Colon is moderately distended diffusely with gas and stool likely to represent chronic constipation. Prominent stool filled distention and wall thickening of the rectum may indicate proctitis or stercoral colitis.  Soft tissue ulcerations demonstrated over the right hip and over the sacrum. Chronic dislocation of the right hip. Suprapubic catheter defects of bladder. Nonobstructing bilateral renal stones. Intrahepatic bile duct dilatation appears to be chronic and possibly related to previous cholecystectomy. Electronically Signed   By: Burman Nieves M.D.   On: 08/02/2015 05:31   Dg Chest Port 1 View  08/02/2015  CLINICAL DATA:  Weakness and hypotension. Bilateral flank pain, nausea, and lethargy all day. EXAM: PORTABLE CHEST 1 VIEW COMPARISON:  04/26/2015 FINDINGS: Thoracic scoliosis convex towards the right. Postoperative changes in the cervical spine. Normal heart size and pulmonary vascularity. No focal airspace disease or consolidation in the lungs. No blunting of costophrenic angles. No pneumothorax. Mediastinal contours appear intact. Degenerative changes in the left shoulder. IMPRESSION: No active disease. Electronically Signed   By: Burman Nieves M.D.   On: 08/02/2015 00:05    Scheduled Meds: . baclofen  10 mg Oral QID  . ceFEPime (MAXIPIME) IV  2 g Intravenous 3 times per day  . Chlorhexidine Gluconate Cloth  6 each Topical Q0600  . fentaNYL  50 mcg Transdermal Q72H  . heparin  5,000 Units Subcutaneous 3 times per day  . hydrocortisone sod succinate (SOLU-CORTEF) inj  50 mg Intravenous Q6H  . mupirocin ointment  1 application Nasal BID  . oxybutynin  5 mg Oral QID  . pantoprazole (PROTONIX) IV  40 mg Intravenous QHS  . pregabalin  100 mg Oral BID  . rOPINIRole  0.5 mg Oral QHS   Continuous Infusions: . sodium chloride Stopped (08/03/15 0808)  . norepinephrine (LEVOPHED) Adult infusion Stopped (08/02/15 0413)    Active Problems:   Septic shock (HCC)    Time spent: 35 minutes.     Hartley Barefoot A  [facility] Pager [phone]. If 7PM-7AM, please contact night-coverage at [email or web address], password [facility] 08/03/2015, 8:58 AM  LOS: 1 day

## 2015-08-04 LAB — PROCALCITONIN

## 2015-08-04 MED ORDER — OXYCODONE HCL 5 MG PO TABS
5.0000 mg | ORAL_TABLET | Freq: Once | ORAL | Status: DC
  Filled 2015-08-04: qty 1

## 2015-08-04 MED ORDER — HYDROCORTISONE NA SUCCINATE PF 100 MG IJ SOLR
50.0000 mg | Freq: Every day | INTRAMUSCULAR | Status: DC
  Administered 2015-08-05: 50 mg via INTRAVENOUS
  Filled 2015-08-04: qty 2

## 2015-08-04 MED ORDER — SODIUM CHLORIDE 0.9 % IV BOLUS (SEPSIS)
500.0000 mL | Freq: Once | INTRAVENOUS | Status: AC
  Administered 2015-08-04: 500 mL via INTRAVENOUS

## 2015-08-04 MED ORDER — SODIUM CHLORIDE 0.9 % IV BOLUS (SEPSIS)
250.0000 mL | Freq: Once | INTRAVENOUS | Status: AC
  Administered 2015-08-04: 250 mL via INTRAVENOUS

## 2015-08-04 NOTE — Plan of Care (Signed)
Problem: Skin Integrity: Goal: Risk for impaired skin integrity will decrease Outcome: Not Progressing Pt refused CNA and RN to turn pt off  Of his sacral. Pt stated he can do it himself. When RN rounds pt still in same position. RN educated pt on the importance. Pt still refused to be turned.   Problem: Tissue Perfusion: Goal: Risk factors for ineffective tissue perfusion will decrease Outcome: Not Progressing Pt refused Heparin SQ and refused to be turned.

## 2015-08-04 NOTE — Progress Notes (Signed)
[facility] PROGRESS NOTE  Alan Warner WUJ:[id number] DOB: 03-May-1961 DOA: 08/01/2015 PCP: Eloisa Northern, MD  Assessment/Plan: Pt. Is 55 y/o M with hx significant for neurogenic bladder, chronic suprapubic catheter with history of recurrent urinary tract infection, paraplegia, and resides in a SNF. He had apparently become more altered / confused over the course of the day today while experiencing fatigue, nausea, and vomiting with abdominal pain. He says his symptoms are consistent with previous UTI. He was found to be hypotensive by EMS to 66/40 and initially was able to maintain BP's here, but became increasingly hypotensive requiring pressors for blood pressure support.   He was admitted by CCM , he received pressors to help support BP. Sepsis related to UTI. He [facility].   1-Septic Shock; Source UTI.  Received IV fluids and Levo.  On stress dose steroids. Taper today  Off pressors since 2-16.  On cefepime.  Awaiting urine culture. Growing pseudomonas.   2-Encephalopathy: Suspect multifactorial, sepsis, hypotension, polypharmacy  Resolved.  Resume pain medication.   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  UA with too numerous WBC  Follow urine culture.  Cefepime for now >awaiting urine culture.  On Oxybutynin    Hx of Stercoral colitis / fecal impaction CT with prominent stool, wall thickening of rectum may indicate colitis or stercoral colitis.  Laxative ordered.  On IV antibiotics.   Has had multiple BM.  Now with loose stool. Hold laxatives, if persist will check for C diff.   Mild Transaminates; follow trend.   Paraplegia  DVT prophylaxis: Heparin.   Code Status: Full Code.  Family Communication: Care discussed with patient.  Disposition Plan: Remain inpatient for treatment of sepsis.     Consultants:  CCM admitted patient.   Procedures:  none  Antibiotics:  Cefepime 2-16  HPI/Subjective: Feeling well, no new complaints.   Objective: Filed Vitals:   08/04/15 1339 08/04/15 1344  BP: 149/106 68/25  Pulse: 73 58  Temp:    Resp: 19     Intake/Output Summary (Last 24 hours) at 08/04/15 1658 Last data filed at 08/04/15 1343  Gross per 24 hour  Intake    270 ml  Output   2500 ml  Net  -2230 ml   Filed Weights   08/02/15 0500 08/03/15 0618  Weight: 75.2 kg (165 lb 12.6 oz) 80.9 kg (178 lb 5.6 oz)    Exam:   General:  Alert in no distress  Cardiovascular: S 1, S 2 RRR  Respiratory: CTA  Abdomen: BS present, soft, distended.   Musculoskeletal: no edema   Data Reviewed: Basic Metabolic Panel:  Recent Labs Lab 08/01/15 2345 08/02/15 0027 08/02/15 0439 08/03/15 0613  NA 138 140 143 140  K 4.6 4.5 4.1 3.7  CL 106 105 113* 113*  CO2 19*  --  20* 19*  GLUCOSE 106* 99 111* 122*  BUN CREATININE 1.08 1.00 0.84 0.61  CALCIUM 8.2*  --  7.5* 7.9*  MG  --   --  1.5*  --   PHOS  --   --  2.4*  --    Liver Function Tests:  Recent Labs Lab 08/01/15 2345 08/03/15 0613  AST 51* 20  ALT 31 19  ALKPHOS 114 78  BILITOT 1.0 0.6  PROT 6.5 6.1*  ALBUMIN 2.6* 2.3*   No  results for input(s): LIPASE, AMYLASE in the last 168 hours. No results for input(s): AMMONIA in the last 168 hours. CBC:  Recent Labs Lab 08/01/15 2345 08/02/15 0027 08/02/15 0439 08/03/15 0613  WBC 7.8  --  6.4 3.0*  NEUTROABS 5.0  --   --   --   HGB 11.9* 13.6 10.1* 9.1*  HCT 36.2* 40.0 30.8* 28.7*  MCV 82.8  --  82.8 81.5  PLT 232  --  200 165   Cardiac Enzymes: No results for input(s): CKTOTAL, CKMB, CKMBINDEX, TROPONINI in the last 168 hours. BNP (last 3 results)  Recent Labs  10/06/14 0454  BNP 97.5    ProBNP (last 3 results) No results for input(s): PROBNP in the last 8760 hours.  CBG:  Recent Labs Lab 08/02/15 0551  GLUCAP 101*     Recent Results (from the past 240 hour(s))  Blood Culture (routine x 2)     Status: None (Preliminary result)   Collection Time: 08/01/15 11:56 PM  Result Value Ref Range Status   Specimen Description BLOOD RIGHT HAND  Final   Special Requests IN PEDIATRIC BOTTLE  Final   Culture NO GROWTH 2 DAYS  Final   Report Status PENDING  Incomplete  Blood Culture (routine x 2)     Status: None (Preliminary result)   Collection Time: 08/02/15 12:35 AM  Result Value Ref Range Status   Specimen Description BLOOD RIGHT ARM  Final   Special Requests BOTTLES DRAWN AEROBIC AND ANAEROBIC  Final   Culture NO GROWTH 2 DAYS  Final   Report Status PENDING  Incomplete  Urine culture     Status: None (Preliminary result)   Collection Time: 08/02/15  1:03 AM  Result Value Ref Range Status   Specimen Description URINE, RANDOM  Final   Special Requests NONE  Final   Culture 30,000 COLONIES/mL PSEUDOMONAS AERUGINOSA  Final   Report Status PENDING  Incomplete  MRSA PCR Screening     Status: Abnormal   Collection Time: 08/02/15  5:54 AM  Result Value Ref Range Status   MRSA by PCR POSITIVE (A) NEGATIVE Final    Comment:        The GeneXpert MRSA Assay (FDA approved for NASAL specimens only), is one component of a comprehensive MRSA colonization surveillance program. It is not intended to diagnose MRSA infection nor to guide or monitor treatment for MRSA infections. RESULT CALLED TO, READ BACK BY AND VERIFIED WITH: J BISHOP,RN AT 1610 08/02/15 BY L BENFIELD      Studies: No results found.  Scheduled Meds: . baclofen  10 mg Oral QID  . ceFEPime (MAXIPIME) IV  1 g Intravenous Q12H  . Chlorhexidine Gluconate Cloth  6 each Topical Q0600  . fentaNYL  50 mcg Transdermal Q72H  . heparin  5,000 Units Subcutaneous 3 times per day  . hydrocortisone sod succinate (SOLU-CORTEF) inj  50 mg Intravenous Q12H  . mupirocin ointment  1 application Nasal BID  . oxybutynin  5 mg Oral QID  .  pantoprazole  40 mg Oral QHS  . polyethylene glycol  17 g Oral Daily  . pregabalin  100 mg Oral BID  . rOPINIRole  0.5 mg Oral QHS   Continuous Infusions: . sodium chloride 75 mL/hr at 08/04/15 0452    Active Problems:   Septic shock (HCC)    Time spent: 35 minutes.     Hartley Barefoot A  [facility] Pager [phone]. If 7PM-7AM, please contact night-coverage at [email or web address], password  [email or web address] 08/04/2015, 4:58 PM  LOS: 2 days

## 2015-08-04 NOTE — Progress Notes (Addendum)
RN rechecked B/P, This one entered is inaccurate. Alan Warner 08/04/2015 1346

## 2015-08-05 LAB — BASIC METABOLIC PANEL
Anion gap: 6 (ref 5–15)
BUN: 8 mg/dL (ref 6–20)
CALCIUM: 8 mg/dL — AB (ref 8.9–10.3)
CHLORIDE: 114 mmol/L — AB (ref 101–111)
CO2: 20 mmol/L — ABNORMAL LOW (ref 22–32)
CREATININE: 0.72 mg/dL (ref 0.61–1.24)
GFR calc Af Amer: >60 mL/min (ref >60)
Glucose, Bld: 115 mg/dL — ABNORMAL HIGH (ref 65–99)
Potassium: 2.7 mmol/L — CL (ref 3.5–5.1)
SODIUM: 140 mmol/L (ref 135–145)

## 2015-08-05 LAB — CBC
HCT: 26.4 % — ABNORMAL LOW (ref 39.0–52.0)
Hemoglobin: 8.5 g/dL — ABNORMAL LOW (ref 13.0–17.0)
MCH: 26.1 pg (ref 26.0–34.0)
MCHC: 32.2 g/dL (ref 30.0–36.0)
MCV: 81 fL (ref 78.0–100.0)
PLATELETS: 127 10*3/uL — AB (ref 150–400)
RBC: 3.26 MIL/uL — ABNORMAL LOW (ref 4.22–5.81)
RDW: 13.9 % (ref 11.5–15.5)
WBC: 2.8 10*3/uL — AB (ref 4.0–10.5)

## 2015-08-05 LAB — URINE CULTURE: Culture: 30000

## 2015-08-05 LAB — MAGNESIUM: MAGNESIUM: 2 mg/dL (ref 1.7–2.4)

## 2015-08-05 LAB — POTASSIUM: Potassium: 3.4 mmol/L — ABNORMAL LOW (ref 3.5–5.1)

## 2015-08-05 MED ORDER — POTASSIUM CHLORIDE 10 MEQ/100ML IV SOLN
INTRAVENOUS | Status: AC
  Filled 2015-08-05: qty 100

## 2015-08-05 MED ORDER — POTASSIUM CHLORIDE 10 MEQ/100ML IV SOLN
10.0000 meq | INTRAVENOUS | Status: AC
  Administered 2015-08-05 (×4): 10 meq via INTRAVENOUS
  Filled 2015-08-05 (×3): qty 100

## 2015-08-05 MED ORDER — CIPROFLOXACIN HCL 500 MG PO TABS: 500.0000 mg | ORAL_TABLET | Freq: Two times a day (BID) | ORAL | Status: DC

## 2015-08-05 MED ORDER — POTASSIUM CHLORIDE CRYS ER 20 MEQ PO TBCR
40.0000 meq | EXTENDED_RELEASE_TABLET | Freq: Once | ORAL | Status: AC
  Administered 2015-08-05: 40 meq via ORAL
  Filled 2015-08-05: qty 2

## 2015-08-05 MED ORDER — OXYCODONE HCL 15 MG PO TABS: 15.0000 mg | ORAL_TABLET | Freq: Four times a day (QID) | ORAL | Status: DC | PRN

## 2015-08-05 MED ORDER — POLYSACCHARIDE IRON COMPLEX 150 MG PO CAPS: 150.0000 mg | ORAL_CAPSULE | Freq: Every day | ORAL | Status: DC

## 2015-08-05 MED ORDER — POTASSIUM CHLORIDE 10 MEQ/100ML IV SOLN: 10.0000 meq | INTRAVENOUS | Status: DC

## 2015-08-05 NOTE — Discharge Summary (Signed)
Physician Discharge Summary  Alan Warner ZOX:[id number] DOB: [birth year] DOA: 08/01/2015  PCP: Eloisa Northern, MD  Admit date: 08/01/2015 Discharge date: 08/05/2015  Time spent: 35 minutes  Recommendations for Outpatient Follow-up:  Needs CBC to follow hb level.  Needs bmet to follow k level.   Discharge Diagnoses:    Septic shock (HCC)   UTI, pseudomonas.    Encephalopathy   Hx of Stercoral colitis / fecal impaction   Urinary Tract Sepsis 2/2 Chronic Indwelling Foley, Neurogenic Bladder 2/2 spinal cord injury 2005    Paraplegia  Discharge Condition: stable  Diet recommendation: regular  Filed Weights   08/03/15 0618 08/05/15 0626  Weight: 80.9 kg (178 lb 5.6 oz) 85.2 kg (187 lb 13.3 oz)    History of present illness:  HISTORY OF PRESENT ILLNESS: Pt is encephelopathic; therefore, this HPI is mostly obtained from chart review. Alan Warner is a 55 y.o. male with PMH as outlined below. Pt. Is 55 y/o M with hx significant for neurogenic bladder, chronic suprapubic catheter with history of recurrent urinary tract infection, paraplegia, and resides in a SNF. He had apparently become more altered / confused over the course of the day today while experiencing fatigue, nausea, and vomiting with abdominal pain. He says his symptoms are consistent with previous UTI. He was found to be hypotensive by EMS to 66/40 and initially was able to maintain BP's here, but became increasingly hypotensive requiring pressors for blood pressure support.   Urinalysis reflective of infection at this time, with hypotension code sepsis was called. ICU consulted for management as he was requiring pressors for blood pressure support. Additionally, given dilaudid in the ED may be contributing to AMS / Lethargy.   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  Hospital Course:  Pt. Is 55 y/o M with hx significant for neurogenic bladder, chronic suprapubic catheter with history of recurrent urinary tract infection, paraplegia, and resides in a SNF. He had apparently become more altered / confused over the course of the day today while experiencing fatigue, nausea, and vomiting with abdominal pain. He says his symptoms are consistent with previous UTI. He was found to be hypotensive by EMS to 66/40 and initially was able to maintain BP's here, but became increasingly hypotensive requiring pressors for blood pressure support.   He was admitted by CCM , he received pressors to help support BP. Sepsis related to UTI. [facility].   1-Septic Shock; Source UTI.  Received IV fluids and Levo.  On stress dose steroids. Taper today  Off pressors since 2-16.  On cefepime.  Growing pseudomonas. Sensitive to cipro. Patient has received cipro in the past without any allergies or problems.  Will be discharge on 5 more days of antibiotics for total 10 days.   2-Encephalopathy: Suspect multifactorial, sepsis, hypotension, polypharmacy  Resolved.  Resume pain medication.   Hypomagnesemia;  Replace IV  Hypokalemia; repeat labs. Getting IV runs.   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  UA with too numerous WBC  Follow urine culture.  Cefepime for now >awaiting urine culture. Discharge on cipro for 5 more days.  On Oxybutynin   Hx of Stercoral colitis / fecal impaction CT with prominent  stool, wall thickening of rectum may indicate colitis or stercoral colitis.  Laxative ordered.  On IV antibiotics.  Has had multiple BM.  Now with loose stool. Hold laxatives, if persist will check for C diff.   Mild  Transaminates; follow trend.  Anemia; pancytopenia. Might be in setting of infection. Discharge on iron. No evidence of active bleeding.  Needs repeat labs.   Paraplegia  Procedures:  none  Consultations:  CCM  Discharge Exam: Filed Vitals:   08/04/15 2132 08/05/15 0629  BP: 102/65 110/52  Pulse: 69 53  Temp: 98.1 F (36.7 C) 98.4 F (36.9 C)  Resp: 16 16    General: Alert in no distress Cardiovascular: S 1, S 2 RRR Respiratory: CTA  Discharge Instructions    Current Discharge Medication List    START taking these medications   Details  ciprofloxacin (CIPRO) 500 MG tablet Take 1 tablet (500 mg total) by mouth 2 (two) times daily. Qty: 10 tablet, Refills: 0      CONTINUE these medications which have CHANGED   Details  oxyCODONE (ROXICODONE) 15 MG immediate release tablet Take 1 tablet (15 mg total) by mouth every 6 (six) hours as needed for pain. Qty: 20 tablet, Refills: 0      CONTINUE these medications which have NOT CHANGED   Details  acetaminophen (TYLENOL) 325 MG tablet Take 650 mg by mouth every 4 (four) hours as needed for pain or fever.    antiseptic oral rinse (BIOTENE) LIQD 15 mLs by Mouth Rinse route daily as needed for dry mouth.     baclofen (LIORESAL) 10 MG tablet Take 10 mg by mouth 4 (four) times daily.     bisacodyl (DULCOLAX) 10 MG suppository Place 10 mg rectally daily as needed for moderate constipation.    diphenhydrAMINE (BENADRYL) 25 MG tablet Take 25 mg by mouth every 4 (four) hours as needed for itching.    fentaNYL (DURAGESIC - DOSED MCG/HR) 75 MCG/HR Place 1 patch (75 mcg total) onto the skin every 3 (three) days. APPLY WITH 50 MCG PATCH FOR A TOTAL OF 125 MCG Qty: 5 patch, Refills: 0    guaiFENesin-dextromethorphan (ROBITUSSIN DM) 100-10 MG/5ML syrup Take 5 mLs by mouth every 4 (four) hours as needed for cough. Qty: 118 mL, Refills: 0    ipratropium-albuterol (DUONEB) 0.5-2.5 (3) MG/3ML SOLN Take 3 mLs by nebulization 3  (three) times daily. Qty: 360 mL, Refills: 0    LORazepam (ATIVAN) 0.5 MG tablet Take 1 tablet (0.5 mg total) by mouth every 6 (six) hours as needed for anxiety. Qty: 10 tablet, Refills: 0    ondansetron (ZOFRAN) 4 MG tablet Take 4 mg by mouth every 6 (six) hours as needed for nausea or vomiting.    oxybutynin (DITROPAN) 5 MG tablet Take 5 mg by mouth 4 (four) times daily.    polyethylene glycol (MIRALAX / GLYCOLAX) packet Take 17 g by mouth daily.     potassium chloride (K-DUR,KLOR-CON) 10 MEQ tablet Take 10 mEq by mouth 2 (two) times daily.    pregabalin (LYRICA) 100 MG capsule Take 100 mg by mouth 2 (two) times daily.    rOPINIRole (REQUIP) 0.5 MG tablet Take 0.5 mg by mouth at bedtime.    simethicone (MYLICON) 125 MG chewable tablet Chew 125 mg by mouth 4 (four) times daily - after meals and at bedtime.       STOP taking these medications     cefTRIAXone 1 g in dextrose 5 % 50 mL  HYDROcodone-acetaminophen (NORCO/VICODIN) 5-325 MG tablet      tiZANidine (ZANAFLEX) 4 MG tablet        Allergies  Allergen Reactions  . Levaquin [Levofloxacin In D5w] Other (See Comments)    PER MEDICATION MAR  . Penicillins Other (See Comments)    PER MEDICATION MAR  . Vancomycin Rash    PER MEDICATION MAR      The results of significant diagnostics from this hospitalization (including imaging, microbiology, ancillary and laboratory) are listed below for reference.    Significant Diagnostic Studies: Ct Head Wo Contrast  08/02/2015  CLINICAL DATA:  Altered mental status with increased confusion over the course of the day. Fatigue, nausea, vomiting, and abdominal pain. Hypotension. EXAM: CT HEAD WITHOUT CONTRAST TECHNIQUE: Contiguous axial images were obtained from the base of the skull through the vertex without intravenous contrast. COMPARISON:  None. FINDINGS: Ventricles and sulci appear symmetrical. No ventricular dilatation. No mass effect or midline shift. No abnormal extra-axial  fluid collections. Gray-white matter junctions are distinct. Basal cisterns are not effaced. No evidence of acute intracranial hemorrhage. No depressed skull fractures. Visualized paranasal sinuses and mastoid air cells are not opacified. IMPRESSION: No acute intracranial abnormalities. Electronically Signed   By: Burman Nieves M.D.   On: 08/02/2015 05:23   Ct Abdomen Pelvis W Contrast  08/02/2015  CLINICAL DATA:  Fatigue, nausea, vomiting, and abdominal pain. History of paraplegia, neurogenic bladder, chronic suprapubic catheter, and recurrent urinary tract infections. Hypotension. EXAM: CT ABDOMEN AND PELVIS WITH CONTRAST TECHNIQUE: Multidetector CT imaging of the abdomen and pelvis was performed using the standard protocol following bolus administration of intravenous contrast. CONTRAST:  OMNIPAQUE IOHEXOL 300 MG/ML  SOLN COMPARISON:  06/28/2015 FINDINGS: Atelectasis in the lung bases. The gallbladder is not visualized and may be contracted or surgically absent. There is prominent intrahepatic bile duct dilatation with mild extrahepatic bile duct dilatation. No obstructing mass lesion is identified. Similar ductal dilatation has been present previously and this may be physiologic. Correlation with liver enzymes is suggested. Pancreas appears somewhat atrophic. Spleen is mildly enlarged. No adrenal gland nodules. Abdominal aorta appears normal. Prominent paracaval lymph nodes without pathologic enlargement, likely reactive. Inferior vena caval filter is present. Kidneys are symmetrical. Multiple stones demonstrated within both kidneys. No hydronephrosis or hydroureter. Stomach and small bowel are decompressed. : Is distended, vertically the rectosigmoid region, and is filled with stool and air consistent with chronic constipation. No free air or free fluid demonstrated in the abdomen. Abdominal wall musculature appears intact. Pelvis: A suprapubic catheter is present deflating the bladder. No pelvic  mass or lymphadenopathy. Rectal wall is diffusely thickened, possibly representing proctitis or possibly stercoral colitis. No free or loculated pelvic fluid collections. Fatty atrophy of the hip muscles. Superior dislocation of the right hip. This is chronic. Small left hip effusion. Infiltration in the subcutaneous fat throughout the hip regions. Focal skin defect and underlying scarring over the lateral right hip probably represents ulceration. Small ulceration over the left side of the sacrum. Soft tissue scarring or edema over the left hip may represent bursitis. Degenerative changes in the left hip. IMPRESSION: Colon is moderately distended diffusely with gas and stool likely to represent chronic constipation. Prominent stool filled distention and wall thickening of the rectum may indicate proctitis or stercoral colitis. Soft tissue ulcerations demonstrated over the right hip and over the sacrum. Chronic dislocation of the right hip. Suprapubic catheter defects of bladder. Nonobstructing bilateral renal stones. Intrahepatic bile duct dilatation appears to be chronic and possibly  related to previous cholecystectomy. Electronically Signed   By: Burman Nieves M.D.   On: 08/02/2015 05:31   Dg Chest Port 1 View  08/02/2015  CLINICAL DATA:  Weakness and hypotension. Bilateral flank pain, nausea, and lethargy all day. EXAM: PORTABLE CHEST 1 VIEW COMPARISON:  04/26/2015 FINDINGS: Thoracic scoliosis convex towards the right. Postoperative changes in the cervical spine. Normal heart size and pulmonary vascularity. No focal airspace disease or consolidation in the lungs. No blunting of costophrenic angles. No pneumothorax. Mediastinal contours appear intact. Degenerative changes in the left shoulder. IMPRESSION: No active disease. Electronically Signed   By: Burman Nieves M.D.   On: 08/02/2015 00:05    Microbiology: Recent Results (from the past 240 hour(s))  Blood Culture (routine x 2)     Status: None  (Preliminary result)   Collection Time: 08/01/15 11:56 PM  Result Value Ref Range Status   Specimen Description BLOOD RIGHT HAND  Final   Special Requests IN PEDIATRIC BOTTLE  Final   Culture NO GROWTH 2 DAYS  Final   Report Status PENDING  Incomplete  Blood Culture (routine x 2)     Status: None (Preliminary result)   Collection Time: 08/02/15 12:35 AM  Result Value Ref Range Status   Specimen Description BLOOD RIGHT ARM  Final   Special Requests BOTTLES DRAWN AEROBIC AND ANAEROBIC  Final   Culture NO GROWTH 2 DAYS  Final   Report Status PENDING  Incomplete  Urine culture     Status: None (Preliminary result)   Collection Time: 08/02/15  1:03 AM  Result Value Ref Range Status   Specimen Description URINE, RANDOM  Final   Special Requests NONE  Final   Culture 30,000 COLONIES/mL PSEUDOMONAS AERUGINOSA  Final   Report Status PENDING  Incomplete   Organism ID, Bacteria PSEUDOMONAS AERUGINOSA  Final      Susceptibility   Pseudomonas aeruginosa - MIC*    CEFTAZIDIME 2 SENSITIVE Sensitive     CIPROFLOXACIN <=0.25 SENSITIVE Sensitive     GENTAMICIN 4 SENSITIVE Sensitive     IMIPENEM 1 SENSITIVE Sensitive     PIP/TAZO 8 SENSITIVE Sensitive     CEFEPIME 8 SENSITIVE Sensitive     * 30,000 COLONIES/mL PSEUDOMONAS AERUGINOSA  MRSA PCR Screening     Status: Abnormal   Collection Time: 08/02/15  5:54 AM  Result Value Ref Range Status   MRSA by PCR POSITIVE (A) NEGATIVE Final    Comment:        The GeneXpert MRSA Assay (FDA approved for NASAL specimens only), is one component of a comprehensive MRSA colonization surveillance program. It is not intended to diagnose MRSA infection nor to guide or monitor treatment for MRSA infections. RESULT CALLED TO, READ BACK BY AND VERIFIED WITH: J BISHOP,RN AT 0849 08/02/15 BY L BENFIELD      Labs: Basic Metabolic Panel:  Recent Labs Lab 08/01/15 2345 08/02/15 0027 08/02/15 0439 08/03/15 0613 08/05/15 0513  NA 138 140 143 140  140  K 4.6 4.5 4.1 3.7 2.7*  CL 106 105 113* 113* 114*  CO2 19*  --  20* 19* 20*  GLUCOSE 106* 99 111* 122* 115*  BUN 17 20 15 10 8   CREATININE 1.08 1.00 0.84 0.61 0.72  CALCIUM 8.2*  --  7.5* 7.9* 8.0*  MG  --   --  1.5*  --  2.0  PHOS  --   --  2.4*  --   --    Liver Function Tests:  Recent Labs Lab 08/01/15 2345 08/03/15 0613  AST 51* 20  ALT 31 19  ALKPHOS 114 78  BILITOT 1.0 0.6  PROT 6.5 6.1*  ALBUMIN 2.6* 2.3*   No results for input(s): LIPASE, AMYLASE in the last 168 hours. No results for input(s): AMMONIA in the last 168 hours. CBC:  Recent Labs Lab 08/01/15 2345 08/02/15 0027 08/02/15 0439 08/03/15 0613 08/05/15 0513  WBC 7.8  --  6.4 3.0* 2.8*  NEUTROABS 5.0  --   --   --   --   HGB 11.9* 13.6 10.1* 9.1* 8.5*  HCT 36.2* 40.0 30.8* 28.7* 26.4*  MCV 82.8  --  82.8 81.5 81.0  PLT 232  --  200 165 127*   Cardiac Enzymes: No results for input(s): CKTOTAL, CKMB, CKMBINDEX, TROPONINI in the last 168 hours. BNP: BNP (last 3 results)  Recent Labs  10/06/14 0454  BNP 97.5    ProBNP (last 3 results) No results for input(s): PROBNP in the last 8760 hours.  CBG:  Recent Labs Lab 08/02/15 0551  GLUCAP 101*       Signed:  Hartley Barefoot A MD.  [facility] 08/05/2015, 12:27 PM

## 2015-08-05 NOTE — Progress Notes (Signed)
Alan Warner to be D/C'd Skilled nursing facility per MD order.  Discussed with the patient and all questions fully answered.  VSS, Skin clean, dry and intact without evidence of skin break down, no evidence of skin tears noted. IV catheter discontinued intact. Site without signs and symptoms of complications. Dressing and pressure applied.  An After Visit Summary was printed and given to the patient. Patient received prescription.  D/c education completed with patient/family including follow up instructions, medication list, d/c activities limitations if indicated, with other d/c instructions as indicated by MD - patient able to verbalize understanding, all questions fully answered.   Patient instructed to return to ED, call 911, or call MD for any changes in condition.   Patient escorted via WC, and D/C home via private auto.  Alan Warner 08/05/2015 5:04 PM

## 2015-08-05 NOTE — Progress Notes (Signed)
Attempted to call report to [facility] care no answer

## 2015-08-05 NOTE — Progress Notes (Signed)
Called lab about culture sensitivities.  Lab stated that they will be back today.  Awaiting results now

## 2015-08-05 NOTE — Progress Notes (Signed)
Paged HS MD w/ K level awaiting orders

## 2015-08-05 NOTE — NC FL2 (Signed)
[place] MEDICAID FL2 LEVEL OF CARE SCREENING TOOL     IDENTIFICATION  Patient Name: Alan Warner Birthdate: 06/02/1961 Sex: male Admission Date (Current Location): 08/01/2015  [facility] and [place] Number:  Producer, television/film/video and Address:  [facility], [address], [place], [place] [address]      Provider Number: [id number]  Attending Physician Name and Address:  Alba Cory, MD  Relative Name and Phone Number:       Current Level of Care: Hospital Recommended Level of Care: Nursing Facility Prior Approval Number:    Date Approved/Denied:   PASRR Number: [id number]  Discharge Plan: SNF    Current Diagnoses: Patient Active Problem List   Diagnosis Date Noted  . Septic shock (HCC) 08/02/2015  . 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 Fecal impaction (HCC) 04/24/2015  . Hydronephrosis, right 04/24/2015  . Hypokalemia 04/24/2015  . Fever with multiple potential sources of infection 04/24/2015  . UTI (lower urinary tract infection) 04/24/2015  . Complicated UTI (urinary tract infection) 04/24/2015  . Cough 10/05/2014  . Paraplegia (HCC) 10/05/2014  . Chronic pain 10/05/2014  . Sacral decubitus ulcer 10/05/2014    Orientation RESPIRATION BLADDER Height & Weight     Time, Self, Situation, Place  Normal Continent Weight: 187 lb 13.3 oz (85.2 kg) Height:   (188 cm)  BEHAVIORAL SYMPTOMS/MOOD NEUROLOGICAL BOWEL NUTRITION STATUS      Continent Diet (Low Sodium/Heart Healthy)  AMBULATORY STATUS COMMUNICATION OF NEEDS Skin   Total Care Verbally Normal                       Personal Care Assistance Level of Assistance  Total care       Total Care Assistance: Maximum assistance   Functional Limitations Info             SPECIAL CARE FACTORS FREQUENCY                       Contractures Contractures Info: Not present    Additional Factors Info  Code Status, Allergies Code Status Info: FULL Allergies Info:  Levaquin, Penicillin, Vancomycin           Current Medications (08/05/2015):  This is the current hospital active medication list Current Facility-Administered Medications  Medication Dose Route Frequency Provider Last Rate Last Dose  . 0.9 %  sodium chloride infusion  250 mL Intravenous PRN Duayne Cal, NP      . 0.9 %  sodium chloride infusion   Intravenous Continuous Belkys A Regalado, MD 100 mL/hr at 08/04/15 1727    . baclofen (LIORESAL) tablet 10 mg  10 mg Oral QID Nelda Bucks, MD   10 mg at 08/05/15 1353  . bisacodyl (DULCOLAX) suppository 10 mg  10 mg Rectal Daily PRN Belkys A Regalado, MD      . ceFEPIme (MAXIPIME) 1 g in dextrose 5 % 50 mL IVPB  1 g Intravenous Q12H Lauren D Bajbus, RPH   1 g at 08/05/15 1025  . Chlorhexidine Gluconate Cloth 2 % PADS 6 each  6 each Topical Q0600 Nelda Bucks, MD   6 each at 08/05/15 [phone]  . fentaNYL (DURAGESIC - dosed mcg/hr) 50 mcg  50 mcg Transdermal Q72H Belkys A Regalado, MD   50 mcg at 08/03/15 1033  . ipratropium-albuterol (DUONEB) 0.5-2.5 (3) MG/3ML nebulizer solution 3 mL  3 mL Nebulization Q4H PRN Alba Cory, MD      .  LORazepam (ATIVAN) tablet 0.5 mg  0.5 mg Oral Q6H PRN Belkys A Regalado, MD   0.5 mg at 08/03/15 2220  . mupirocin ointment (BACTROBAN) 2 % 1 application  1 application Nasal BID Nelda Bucks, MD   1 application at 08/05/15 [phone]  . oxybutynin (DITROPAN) tablet 5 mg  5 mg Oral QID Nelda Bucks, MD   5 mg at 08/05/15 1353  . oxyCODONE (Oxy IR/ROXICODONE) immediate release tablet 15 mg  15 mg Oral Q4H PRN Nelda Bucks, MD   15 mg at 08/05/15 1353  . oxyCODONE (Oxy IR/ROXICODONE) immediate release tablet 5 mg  5 mg Oral Once Gerome Apley Hard[place], PA-C   5 mg at 08/04/15 2104  . potassium chloride SA (K-DUR,KLOR-CON) CR tablet 40 mEq  40 mEq Oral Once Belkys A Regalado, MD      . pregabalin (LYRICA) capsule 100 mg  100 mg Oral BID Nelda Bucks, MD   100 mg at 08/05/15 1191  . rOPINIRole  (REQUIP) tablet 0.5 mg  0.5 mg Oral QHS Nelda Bucks, MD   0.5 mg at 08/04/15 2125     Discharge Medications: Please see discharge summary for a list of discharge medications.  Relevant Imaging Results:  Relevant Lab Results:   Additional Information    Regina Coppolino, Harrel Lemon, LCSW

## 2015-08-05 NOTE — Progress Notes (Signed)
Attempted to call report again with no answer.  

## 2015-08-07 LAB — CULTURE, BLOOD (ROUTINE X 2)
CULTURE: NO GROWTH
Culture: NO GROWTH

## 2015-11-30 ENCOUNTER — Emergency Department (HOSPITAL_COMMUNITY)
Admission: EM | Admit: 2015-11-30 | Discharge: 2015-11-30 | Disposition: A | Discharge status: Home / Self Care | Payer: Medicare Other | Attending: Emergency Medicine | Admitting: Emergency Medicine

## 2015-11-30 ENCOUNTER — Emergency Department (HOSPITAL_COMMUNITY): Payer: Medicare Other

## 2015-11-30 ENCOUNTER — Other Ambulatory Visit: Payer: Self-pay

## 2015-11-30 ENCOUNTER — Encounter (HOSPITAL_COMMUNITY): Payer: Self-pay

## 2015-11-30 DIAGNOSIS — F17210 Nicotine dependence, cigarettes, uncomplicated: Secondary | ICD-10-CM | POA: Diagnosis not present

## 2015-11-30 DIAGNOSIS — R079 Chest pain, unspecified: Secondary | ICD-10-CM | POA: Insufficient documentation

## 2015-11-30 DIAGNOSIS — N390 Urinary tract infection, site not specified: Secondary | ICD-10-CM | POA: Diagnosis not present

## 2015-11-30 DIAGNOSIS — Z79899 Other long term (current) drug therapy: Secondary | ICD-10-CM | POA: Insufficient documentation

## 2015-11-30 LAB — CBC
HCT: 32.7 % — ABNORMAL LOW (ref 39.0–52.0)
HEMOGLOBIN: 10.5 g/dL — AB (ref 13.0–17.0)
MCH: 25.8 pg — AB (ref 26.0–34.0)
MCHC: 32.1 g/dL (ref 30.0–36.0)
MCV: 80.3 fL (ref 78.0–100.0)
PLATELETS: 82 10*3/uL — AB (ref 150–400)
RBC: 4.07 MIL/uL — AB (ref 4.22–5.81)
RDW: 16.1 % — ABNORMAL HIGH (ref 11.5–15.5)
WBC: 6.1 10*3/uL (ref 4.0–10.5)

## 2015-11-30 LAB — URINALYSIS, ROUTINE W REFLEX MICROSCOPIC
Glucose, UA: NEGATIVE mg/dL
Ketones, ur: 15 mg/dL — AB
NITRITE: NEGATIVE
PH: 7.5 (ref 5.0–8.0)
Protein, ur: 100 mg/dL — AB
SPECIFIC GRAVITY, URINE: 1.017 (ref 1.005–1.030)

## 2015-11-30 LAB — I-STAT TROPONIN, ED: Troponin i, poc: 0 ng/mL (ref 0.00–0.08)

## 2015-11-30 LAB — BASIC METABOLIC PANEL
Anion gap: 11 (ref 5–15)
BUN: 12 mg/dL (ref 6–20)
CHLORIDE: 108 mmol/L (ref 101–111)
CO2: 17 mmol/L — AB (ref 22–32)
CREATININE: 0.74 mg/dL (ref 0.61–1.24)
Calcium: 8.3 mg/dL — ABNORMAL LOW (ref 8.9–10.3)
GFR calc non Af Amer: >60 mL/min (ref >60)
Glucose, Bld: 61 mg/dL — ABNORMAL LOW (ref 65–99)
Potassium: 3.2 mmol/L — ABNORMAL LOW (ref 3.5–5.1)
Sodium: 136 mmol/L (ref 135–145)

## 2015-11-30 LAB — URINE MICROSCOPIC-ADD ON

## 2015-11-30 MED ORDER — STERILE WATER FOR INJECTION IJ SOLN
INTRAMUSCULAR | Status: AC
  Administered 2015-11-30: 2.1 mL
  Filled 2015-11-30: qty 10

## 2015-11-30 MED ORDER — CEFTRIAXONE SODIUM 1 G IJ SOLR
500.0000 mg | Freq: Once | INTRAMUSCULAR | Status: AC
  Administered 2015-11-30: 500 mg via INTRAMUSCULAR
  Filled 2015-11-30: qty 10

## 2015-11-30 MED ORDER — KETOROLAC TROMETHAMINE 15 MG/ML IJ SOLN
15.0000 mg | Freq: Once | INTRAMUSCULAR | Status: AC
  Administered 2015-11-30: 15 mg via INTRAMUSCULAR
  Filled 2015-11-30: qty 1

## 2015-11-30 MED ORDER — HYDROMORPHONE HCL 1 MG/ML IJ SOLN
1.0000 mg | Freq: Once | INTRAMUSCULAR | Status: AC
  Administered 2015-11-30: 1 mg via INTRAMUSCULAR
  Filled 2015-11-30: qty 1

## 2015-11-30 MED ORDER — NITROFURANTOIN MONOHYD MACRO 100 MG PO CAPS: 100.0000 mg | ORAL_CAPSULE | Freq: Two times a day (BID) | ORAL | Status: DC

## 2015-11-30 NOTE — ED Notes (Addendum)
Per [facility], Pt is coming from [facility]. Pt was diagnosed with Low potassium yesterday and was being treated for that at the facility. Pt was transferred for UTI symptoms. Hx of Urinary Catheter. Reports groin pain. Reports chest pain starting a couple days ago, but normal for patient while on potassium treatment. Pressure ulcer noted to his back per facility. Pt was cleaned by EMS with incontinence and bowel movement.  Vitals per EMS: 103/55, 72 HR, 99 %, 20 RR

## 2015-11-30 NOTE — ED Notes (Signed)
Upon entering room RN noted patient had pulled the call bell out of the wall and was screaming that we were not doing anything for him. He was complaining that we have not obtained IV access as of yet. RN explained that 2 ED RNs and 1 IV team RN had attempted IV access without success. Pt continues to yell and be verbally aggressive. Pt also demanding pain medication. EDP made aware of patient's request for pain medication and of the lack of IV access.
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
## 2015-11-30 NOTE — ED Notes (Signed)
Notified [facility] for transportation back home 

## 2015-11-30 NOTE — ED Notes (Signed)
Attempted IV x 2 with no success. 

## 2015-11-30 NOTE — Discharge Instructions (Signed)
Catheter-Associated Urinary Tract Infection FAQs  What is "catheter-associated urinary tract infection"?  A urinary tract infection (also called "UTI") is an infection in the urinary system, which includes the bladder (which stores the urine) and the kidneys (which filter the blood to make urine). Germs (for example, bacteria or yeasts) do not normally live in these areas; but if germs are introduced, an infection can occur.  If you have a urinary catheter, germs can travel along the catheter and cause an infection in your bladder or your kidney; in that case it is called a catheter-associated urinary tract infection (or "CA-UTI").   What is a urinary catheter?  A urinary catheter is a thin tube placed in the bladder to drain urine. Urine drains through the tube into a bag that collects the urine. A urinary catheter may be used:  · If you are not able to urinate on your own  · To measure the amount of urine that you make, for example, during intensive care  · During and after some types of surgery  · During some tests of the kidneys and bladder  People with urinary catheters have a much higher chance of getting a urinary tract infection than people who don't have a catheter.  How do I get a catheter-associated urinary tract infection (CA-UTI)?  If germs enter the urinary tract, they may cause an infection. Many of the germs that cause a catheter-associated urinary tract infection are common germs found in your intestines that do not usually cause an infection there. Germs can enter the urinary tract when the catheter is being put in or while the catheter remains in the bladder.   What are the symptoms of a urinary tract infection?  Some of the common symptoms of a urinary tract infection are:  · Burning or pain in the lower abdomen (that is, below the stomach)  · Fever  · Bloody urine may be a sign of infection, but is also caused by other problems  · Burning during urination or an increase in the frequency of  urination after the catheter is removed.  Sometimes people with catheter-associated urinary tract infections do not have these symptoms of infection.  Can catheter-associated urinary tract infections be treated?  Yes, most catheter-associated urinary tract infections can be treated with antibiotics and removal or change of the catheter. Your doctor will determine which antibiotic is best for you.   What are some of the things that hospitals are doing to prevent catheter-associated urinary tract infections?  To prevent urinary tract infections, doctors and nurses take the following actions.   Catheter insertion  · External catheters in men (these look like condoms and are placed over the penis rather than into the penis)  · Putting a temporary catheter in to drain the urine and removing it right away. This is called intermittent urethral catheterization.  Catheter care  What can I do to help prevent catheter-associated urinary tract infections if I have a catheter?  · Always clean your hands before and after doing catheter care.  · Always keep your urine bag below the level of your bladder.  · Do not tug or pull on the tubing.  · Do not twist or kink the catheter tubing.  · Ask your healthcare provider each day if you still need the catheter.  What do I need to do when I go home from the hospital?  · If you will be going home with a catheter, your doctor or nurse should explain everything   you need to know about taking care of the catheter. Make sure you understand how to care for it before you leave the hospital.  · If you develop any of the symptoms of a urinary tract infection, such as burning or pain in the lower abdomen, fever, or an increase in the frequency of urination, contact your doctor or nurse immediately.  · Before you go home, make sure you know who to contact if you have questions or problems after you get home.  If you have questions, please ask your doctor or nurse.  Developed and co-sponsored by The  Society for Healthcare Epidemiology of America (SHEA); Infectious Diseases Society of America (IDSA); American Hospital Association; Association for Professionals in Infection Control and Epidemiology (APIC); Centers for Disease Control and Prevention (CDC); and The Joint Commission.     This information is not intended to replace advice given to you by your health care provider. Make sure you discuss any questions you have with your health care provider.     Document Released: 02/25/2012 Document Revised: 10/17/2014 Document Reviewed: 08/16/2014  Elsevier Interactive Patient Education ©2016 Elsevier Inc.

## 2015-12-01 LAB — URINE CULTURE: SPECIAL REQUESTS: NORMAL

## 2015-12-03 ENCOUNTER — Emergency Department (HOSPITAL_COMMUNITY): Payer: Medicare Other

## 2015-12-03 ENCOUNTER — Emergency Department (HOSPITAL_COMMUNITY)
Admission: EM | Admit: 2015-12-03 | Discharge: 2015-12-03 | Disposition: A | Discharge status: Home / Self Care | Payer: Medicare Other | Attending: Emergency Medicine | Admitting: Emergency Medicine

## 2015-12-03 ENCOUNTER — Other Ambulatory Visit: Payer: Self-pay

## 2015-12-03 DIAGNOSIS — R079 Chest pain, unspecified: Secondary | ICD-10-CM | POA: Diagnosis present

## 2015-12-03 DIAGNOSIS — F17210 Nicotine dependence, cigarettes, uncomplicated: Secondary | ICD-10-CM | POA: Insufficient documentation

## 2015-12-03 DIAGNOSIS — J40 Bronchitis, not specified as acute or chronic: Secondary | ICD-10-CM | POA: Diagnosis not present

## 2015-12-03 LAB — BASIC METABOLIC PANEL
ANION GAP: 11 (ref 5–15)
BUN: <5 mg/dL — ABNORMAL LOW (ref 6–20)
CALCIUM: 9 mg/dL (ref 8.9–10.3)
CHLORIDE: 108 mmol/L (ref 101–111)
CO2: 20 mmol/L — AB (ref 22–32)
Creatinine, Ser: 0.67 mg/dL (ref 0.61–1.24)
GFR calc Af Amer: >60 mL/min (ref >60)
GFR calc non Af Amer: >60 mL/min (ref >60)
Glucose, Bld: 94 mg/dL (ref 65–99)
Potassium: 3.1 mmol/L — ABNORMAL LOW (ref 3.5–5.1)
Sodium: 139 mmol/L (ref 135–145)

## 2015-12-03 LAB — CBC WITH DIFFERENTIAL/PLATELET
BASOS PCT: 1 %
Basophils Absolute: 0 10*3/uL (ref 0.0–0.1)
Eosinophils Absolute: 0.1 10*3/uL (ref 0.0–0.7)
Eosinophils Relative: 2 %
HEMATOCRIT: 35.5 % — AB (ref 39.0–52.0)
HEMOGLOBIN: 11.5 g/dL — AB (ref 13.0–17.0)
LYMPHS ABS: 1.3 10*3/uL (ref 0.7–4.0)
LYMPHS PCT: 21 %
MCH: 25.6 pg — AB (ref 26.0–34.0)
MCHC: 32.4 g/dL (ref 30.0–36.0)
MCV: 78.9 fL (ref 78.0–100.0)
MONO ABS: 0.6 10*3/uL (ref 0.1–1.0)
MONOS PCT: 10 %
NEUTROS ABS: 4 10*3/uL (ref 1.7–7.7)
Neutrophils Relative %: 66 %
Platelets: 116 10*3/uL — ABNORMAL LOW (ref 150–400)
RBC: 4.5 MIL/uL (ref 4.22–5.81)
RDW: 15.5 % (ref 11.5–15.5)
WBC: 6 10*3/uL (ref 4.0–10.5)

## 2015-12-03 MED ORDER — OXYCODONE HCL 5 MG PO TABS
15.0000 mg | ORAL_TABLET | Freq: Once | ORAL | Status: AC
Start: 2015-12-03 — End: 2015-12-03
  Administered 2015-12-03: 15 mg via ORAL
  Filled 2015-12-03: qty 3

## 2015-12-03 NOTE — ED Notes (Signed)
[facility] arrived to transport pt to [facility].

## 2015-12-03 NOTE — ED Notes (Signed)
Contacted RN at [facility] to inform that pt will be returning to facility. Also reviewed d/c and follow-up care instructions. In addition, pt verbalized understanding of d/c instructions and follow-up care. No further questions/concerns at this time.

## 2015-12-03 NOTE — ED Notes (Signed)
Notified [facility] for transportation back home 

## 2015-12-03 NOTE — Discharge Instructions (Signed)
We did not see any evidence of infection in your lungs- your x-ray is stable from 2 days ago. All your labwork was normal except your potassium was a little low at 3.1, your nursing home can continue to manage this- it is not at a dangerous level.  You can continue the Robitussin-DM cough syrup and use the Duonebs you have as needed for cough and difficultly breathing. If your symptoms change or you develop new symptoms, report them to the nursing home staff so the nursing home physician may evaluate you. Acute Bronchitis Bronchitis is inflammation of the airways that extend from the windpipe into the lungs (bronchi). The inflammation often causes mucus to develop. This leads to a cough, which is the most common symptom of bronchitis.  In acute bronchitis, the condition usually develops suddenly and goes away over time, usually in a couple weeks. Smoking, allergies, and asthma can make bronchitis worse. Repeated episodes of bronchitis may cause further lung problems.  CAUSES Acute bronchitis is most often caused by the same virus that causes a cold. The virus can spread from person to person (contagious) through coughing, sneezing, and touching contaminated objects. SIGNS AND SYMPTOMS   Cough.   Fever.   Coughing up mucus.   Body aches.   Chest congestion.   Chills.   Shortness of breath.   Sore throat.  DIAGNOSIS  Acute bronchitis is usually diagnosed through a physical exam. Your health care provider will also ask you questions about your medical history. Tests, such as chest X-rays, are sometimes done to rule out other conditions.  TREATMENT  Acute bronchitis usually goes away in a couple weeks. Oftentimes, no medical treatment is necessary. Medicines are sometimes given for relief of fever or cough. Antibiotic medicines are usually not needed but may be prescribed in certain situations. In some cases, an inhaler may be recommended to help reduce shortness of breath and  control the cough. A cool mist vaporizer may also be used to help thin bronchial secretions and make it easier to clear the chest.  HOME CARE INSTRUCTIONS  Get plenty of rest.   Drink enough fluids to keep your urine clear or pale yellow (unless you have a medical condition that requires fluid restriction). Increasing fluids may help thin your respiratory secretions (sputum) and reduce chest congestion, and it will prevent dehydration.   Take medicines only as directed by your health care provider.  If you were prescribed an antibiotic medicine, finish it all even if you start to feel better.  Avoid smoking and secondhand smoke. Exposure to cigarette smoke or irritating chemicals will make bronchitis worse. If you are a smoker, consider using nicotine gum or skin patches to help control withdrawal symptoms. Quitting smoking will help your lungs heal faster.   Reduce the chances of another bout of acute bronchitis by washing your hands frequently, avoiding people with cold symptoms, and trying not to touch your hands to your mouth, nose, or eyes.   Keep all follow-up visits as directed by your health care provider.  SEEK MEDICAL CARE IF: Your symptoms do not improve after 1 week of treatment.  SEEK IMMEDIATE MEDICAL CARE IF:  You develop an increased fever or chills.   You have chest pain.   You have severe shortness of breath.  You have bloody sputum.   You develop dehydration.  You faint or repeatedly feel like you are going to pass out.  You develop repeated vomiting.  You develop a severe headache. MAKE SURE YOU:  Understand these instructions.  Will watch your condition.  Will get help right away if you are not doing well or get worse.   This information is not intended to replace advice given to you by your health care provider. Make sure you discuss any questions you have with your health care provider.   Document Released: 07/10/2004 Document Revised:  06/23/2014 Document Reviewed: 11/23/2012 Elsevier Interactive Patient Education [employer]

## 2015-12-03 NOTE — ED Notes (Signed)
Per EMS - pt dx w/ [facility] over the [facility]. Chest Xray negative, released back to facility where he stays. Pt c/o chest pain, cough, and blood in sputum. Pt refused chest xray at facility and insisted [facility]. No fever. Lung sounds congested. VSS. Pt takes abx for UTI, also has indwelling catheter.
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
## 2015-12-03 NOTE — ED Notes (Signed)
Gave pt Sprite, per Nehemiah SettleBrooke - Charity fundraiserN.

## 2015-12-11 ENCOUNTER — Observation Stay (HOSPITAL_COMMUNITY)
Admission: EM | Admit: 2015-12-11 | Discharge: 2015-12-14 | Disposition: A | Discharge status: Skilled Nursing Facility | Payer: Medicare Other | Attending: Internal Medicine | Admitting: Internal Medicine

## 2015-12-11 ENCOUNTER — Emergency Department (HOSPITAL_COMMUNITY): Payer: Medicare Other

## 2015-12-11 ENCOUNTER — Encounter (HOSPITAL_COMMUNITY): Payer: Self-pay

## 2015-12-11 DIAGNOSIS — B952 Enterococcus as the cause of diseases classified elsewhere: Secondary | ICD-10-CM | POA: Diagnosis not present

## 2015-12-11 DIAGNOSIS — Z79899 Other long term (current) drug therapy: Secondary | ICD-10-CM | POA: Insufficient documentation

## 2015-12-11 DIAGNOSIS — L89610 Pressure ulcer of right heel, unstageable: Secondary | ICD-10-CM | POA: Diagnosis not present

## 2015-12-11 DIAGNOSIS — Z792 Long term (current) use of antibiotics: Secondary | ICD-10-CM | POA: Diagnosis not present

## 2015-12-11 DIAGNOSIS — E871 Hypo-osmolality and hyponatremia: Secondary | ICD-10-CM | POA: Insufficient documentation

## 2015-12-11 DIAGNOSIS — K5900 Constipation, unspecified: Secondary | ICD-10-CM | POA: Diagnosis not present

## 2015-12-11 DIAGNOSIS — G8220 Paraplegia, unspecified: Secondary | ICD-10-CM | POA: Diagnosis present

## 2015-12-11 DIAGNOSIS — L89152 Pressure ulcer of sacral region, stage 2: Secondary | ICD-10-CM | POA: Diagnosis not present

## 2015-12-11 DIAGNOSIS — F17210 Nicotine dependence, cigarettes, uncomplicated: Secondary | ICD-10-CM | POA: Insufficient documentation

## 2015-12-11 DIAGNOSIS — S14106S Unspecified injury at C6 level of cervical spinal cord, sequela: Secondary | ICD-10-CM | POA: Diagnosis not present

## 2015-12-11 DIAGNOSIS — D649 Anemia, unspecified: Secondary | ICD-10-CM | POA: Diagnosis not present

## 2015-12-11 DIAGNOSIS — Y95 Nosocomial condition: Secondary | ICD-10-CM | POA: Diagnosis not present

## 2015-12-11 DIAGNOSIS — R6521 Severe sepsis with septic shock: Secondary | ICD-10-CM

## 2015-12-11 DIAGNOSIS — G8929 Other chronic pain: Secondary | ICD-10-CM | POA: Diagnosis present

## 2015-12-11 DIAGNOSIS — J181 Lobar pneumonia, unspecified organism: Secondary | ICD-10-CM | POA: Diagnosis present

## 2015-12-11 DIAGNOSIS — M623 Immobility syndrome (paraplegic): Secondary | ICD-10-CM | POA: Insufficient documentation

## 2015-12-11 DIAGNOSIS — Z7951 Long term (current) use of inhaled steroids: Secondary | ICD-10-CM | POA: Insufficient documentation

## 2015-12-11 DIAGNOSIS — N390 Urinary tract infection, site not specified: Secondary | ICD-10-CM | POA: Diagnosis not present

## 2015-12-11 DIAGNOSIS — B9620 Unspecified Escherichia coli [E. coli] as the cause of diseases classified elsewhere: Secondary | ICD-10-CM | POA: Diagnosis not present

## 2015-12-11 DIAGNOSIS — A419 Sepsis, unspecified organism: Principal | ICD-10-CM | POA: Insufficient documentation

## 2015-12-11 DIAGNOSIS — J189 Pneumonia, unspecified organism: Secondary | ICD-10-CM | POA: Diagnosis present

## 2015-12-11 DIAGNOSIS — L8990 Pressure ulcer of unspecified site, unspecified stage: Secondary | ICD-10-CM | POA: Insufficient documentation

## 2015-12-11 DIAGNOSIS — N319 Neuromuscular dysfunction of bladder, unspecified: Secondary | ICD-10-CM | POA: Diagnosis not present

## 2015-12-11 DIAGNOSIS — I9589 Other hypotension: Secondary | ICD-10-CM | POA: Diagnosis not present

## 2015-12-11 DIAGNOSIS — Z79891 Long term (current) use of opiate analgesic: Secondary | ICD-10-CM | POA: Diagnosis not present

## 2015-12-11 LAB — I-STAT CG4 LACTIC ACID, ED: LACTIC ACID, VENOUS: 2.18 mmol/L — AB (ref 0.5–1.9)

## 2015-12-11 LAB — URINALYSIS, ROUTINE W REFLEX MICROSCOPIC
BILIRUBIN URINE: NEGATIVE
Glucose, UA: NEGATIVE mg/dL
KETONES UR: NEGATIVE mg/dL
NITRITE: NEGATIVE
Protein, ur: NEGATIVE mg/dL
Specific Gravity, Urine: 1.01 (ref 1.005–1.030)
pH: 6.5 (ref 5.0–8.0)

## 2015-12-11 LAB — COMPREHENSIVE METABOLIC PANEL
ALT: 13 U/L — ABNORMAL LOW (ref 17–63)
AST: 17 U/L (ref 15–41)
Albumin: 2.6 g/dL — ABNORMAL LOW (ref 3.5–5.0)
Alkaline Phosphatase: 117 U/L (ref 38–126)
Anion gap: 8 (ref 5–15)
BILIRUBIN TOTAL: 2.1 mg/dL — AB (ref 0.3–1.2)
BUN: 14 mg/dL (ref 6–20)
CO2: 18 mmol/L — ABNORMAL LOW (ref 22–32)
CREATININE: 0.85 mg/dL (ref 0.61–1.24)
Calcium: 8.4 mg/dL — ABNORMAL LOW (ref 8.9–10.3)
Chloride: 107 mmol/L (ref 101–111)
GFR calc Af Amer: >60 mL/min (ref >60)
Glucose, Bld: 93 mg/dL (ref 65–99)
POTASSIUM: 4 mmol/L (ref 3.5–5.1)
Sodium: 133 mmol/L — ABNORMAL LOW (ref 135–145)
TOTAL PROTEIN: 6.8 g/dL (ref 6.5–8.1)

## 2015-12-11 LAB — CBC WITH DIFFERENTIAL/PLATELET
BASOS PCT: 0 %
Basophils Absolute: 0 10*3/uL (ref 0.0–0.1)
EOS ABS: 0 10*3/uL (ref 0.0–0.7)
Eosinophils Relative: 0 %
HCT: 32.9 % — ABNORMAL LOW (ref 39.0–52.0)
Hemoglobin: 10.5 g/dL — ABNORMAL LOW (ref 13.0–17.0)
Lymphocytes Relative: 9 %
Lymphs Abs: 0.4 10*3/uL — ABNORMAL LOW (ref 0.7–4.0)
MCH: 25.1 pg — ABNORMAL LOW (ref 26.0–34.0)
MCHC: 31.9 g/dL (ref 30.0–36.0)
MCV: 78.7 fL (ref 78.0–100.0)
MONO ABS: 0.5 10*3/uL (ref 0.1–1.0)
MONOS PCT: 10 %
Neutro Abs: 4.1 10*3/uL (ref 1.7–7.7)
Neutrophils Relative %: 81 %
Platelets: 126 10*3/uL — ABNORMAL LOW (ref 150–400)
RBC: 4.18 MIL/uL — ABNORMAL LOW (ref 4.22–5.81)
RDW: 16.7 % — AB (ref 11.5–15.5)
WBC: 5 10*3/uL (ref 4.0–10.5)

## 2015-12-11 LAB — URINE MICROSCOPIC-ADD ON

## 2015-12-11 LAB — MAGNESIUM: MAGNESIUM: 1.7 mg/dL (ref 1.7–2.4)

## 2015-12-11 MED ORDER — NOREPINEPHRINE BITARTRATE 1 MG/ML IV SOLN
0.0000 ug/min | Freq: Once | INTRAVENOUS | Status: DC
  Filled 2015-12-11: qty 4

## 2015-12-11 MED ORDER — SODIUM CHLORIDE 0.9 % IV BOLUS (SEPSIS)
250.0000 mL | Freq: Once | INTRAVENOUS | Status: AC
  Administered 2015-12-11: 250 mL via INTRAVENOUS

## 2015-12-11 MED ORDER — DEXTROSE 5 % IV SOLN
2.0000 g | Freq: Once | INTRAVENOUS | Status: AC
  Administered 2015-12-11: 2 g via INTRAVENOUS
  Filled 2015-12-11: qty 2

## 2015-12-11 MED ORDER — SODIUM CHLORIDE 0.9 % IV BOLUS (SEPSIS)
1000.0000 mL | Freq: Once | INTRAVENOUS | Status: AC
  Administered 2015-12-11: 1000 mL via INTRAVENOUS

## 2015-12-11 MED ORDER — HYDROCORTISONE NA SUCCINATE PF 100 MG IJ SOLR
100.0000 mg | Freq: Once | INTRAMUSCULAR | Status: AC
  Administered 2015-12-11: 100 mg via INTRAVENOUS
  Filled 2015-12-11: qty 2

## 2015-12-11 MED ORDER — DEXTROSE 5 % IV SOLN
500.0000 mg | Freq: Once | INTRAVENOUS | Status: AC
  Administered 2015-12-11: 500 mg via INTRAVENOUS
  Filled 2015-12-11: qty 500

## 2015-12-11 MED ORDER — ACETAMINOPHEN 325 MG PO TABS
650.0000 mg | ORAL_TABLET | Freq: Once | ORAL | Status: AC
  Administered 2015-12-11: 650 mg via ORAL
  Filled 2015-12-11: qty 2

## 2015-12-11 MED ORDER — SODIUM CHLORIDE 0.9 % IV BOLUS (SEPSIS)
750.0000 mL | Freq: Once | INTRAVENOUS | Status: AC
  Administered 2015-12-12: 750 mL via INTRAVENOUS

## 2015-12-11 NOTE — ED Notes (Signed)
Pt is coming from [facility] via [facility]. Pt was diagnosed with PNA and UTI and started taking levaquin and cipro today. Pt has productive cough and cloudy, amber urine. Pt is paraplegic with chronic foley. Per [facility] last bp 90/56, 02- 98% 15LPM NRB, heart rate 110. Pt reports that his blood pressure is normally low. PT A&Ox 4
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
## 2015-12-12 ENCOUNTER — Inpatient Hospital Stay (HOSPITAL_COMMUNITY): Payer: Medicare Other

## 2015-12-12 ENCOUNTER — Encounter (HOSPITAL_COMMUNITY): Payer: Self-pay | Admitting: *Deleted

## 2015-12-12 DIAGNOSIS — K5900 Constipation, unspecified: Secondary | ICD-10-CM

## 2015-12-12 DIAGNOSIS — J189 Pneumonia, unspecified organism: Secondary | ICD-10-CM

## 2015-12-12 DIAGNOSIS — G8220 Paraplegia, unspecified: Secondary | ICD-10-CM

## 2015-12-12 DIAGNOSIS — N390 Urinary tract infection, site not specified: Secondary | ICD-10-CM | POA: Diagnosis not present

## 2015-12-12 DIAGNOSIS — I959 Hypotension, unspecified: Secondary | ICD-10-CM | POA: Diagnosis not present

## 2015-12-12 DIAGNOSIS — L8990 Pressure ulcer of unspecified site, unspecified stage: Secondary | ICD-10-CM | POA: Insufficient documentation

## 2015-12-12 DIAGNOSIS — G9340 Encephalopathy, unspecified: Secondary | ICD-10-CM

## 2015-12-12 DIAGNOSIS — A419 Sepsis, unspecified organism: Principal | ICD-10-CM

## 2015-12-12 DIAGNOSIS — G8929 Other chronic pain: Secondary | ICD-10-CM | POA: Diagnosis not present

## 2015-12-12 LAB — BASIC METABOLIC PANEL
ANION GAP: 7 (ref 5–15)
BUN: 10 mg/dL (ref 6–20)
CALCIUM: 6 mg/dL — AB (ref 8.9–10.3)
CHLORIDE: 115 mmol/L — AB (ref 101–111)
CO2: 16 mmol/L — AB (ref 22–32)
CREATININE: 0.61 mg/dL (ref 0.61–1.24)
GFR calc non Af Amer: >60 mL/min (ref >60)
Glucose, Bld: 90 mg/dL (ref 65–99)
Potassium: 3 mmol/L — ABNORMAL LOW (ref 3.5–5.1)
SODIUM: 138 mmol/L (ref 135–145)

## 2015-12-12 LAB — LACTIC ACID, PLASMA: Lactic Acid, Venous: 1.2 mmol/L (ref 0.5–1.9)

## 2015-12-12 LAB — PROCALCITONIN: PROCALCITONIN: 9.37 ng/mL

## 2015-12-12 LAB — MRSA PCR SCREENING: MRSA BY PCR: POSITIVE — AB

## 2015-12-12 MED ORDER — POTASSIUM CHLORIDE ER 10 MEQ PO TBCR
10.0000 meq | EXTENDED_RELEASE_TABLET | Freq: Two times a day (BID) | ORAL | Status: DC
  Administered 2015-12-12: 10 meq via ORAL
  Filled 2015-12-12 (×2): qty 1

## 2015-12-12 MED ORDER — SODIUM CHLORIDE 0.9 % IV SOLN
INTRAVENOUS | Status: DC
Start: 2015-12-12 — End: 2015-12-13
  Administered 2015-12-12 – 2015-12-13 (×3): via INTRAVENOUS

## 2015-12-12 MED ORDER — POTASSIUM CHLORIDE ER 10 MEQ PO TBCR
40.0000 meq | EXTENDED_RELEASE_TABLET | Freq: Two times a day (BID) | ORAL | Status: DC
  Administered 2015-12-12 – 2015-12-14 (×5): 40 meq via ORAL
  Filled 2015-12-12 (×11): qty 4

## 2015-12-12 MED ORDER — OXYBUTYNIN CHLORIDE 5 MG PO TABS
5.0000 mg | ORAL_TABLET | Freq: Three times a day (TID) | ORAL | Status: DC
  Administered 2015-12-12: 5 mg via ORAL
  Filled 2015-12-12 (×2): qty 1

## 2015-12-12 MED ORDER — OXYCODONE HCL 5 MG PO TABS
15.0000 mg | ORAL_TABLET | Freq: Four times a day (QID) | ORAL | Status: DC | PRN
  Administered 2015-12-12 – 2015-12-14 (×7): 15 mg via ORAL
  Filled 2015-12-12 (×8): qty 3

## 2015-12-12 MED ORDER — HYDROCORTISONE NA SUCCINATE PF 100 MG IJ SOLR
50.0000 mg | Freq: Four times a day (QID) | INTRAMUSCULAR | Status: DC
  Administered 2015-12-12 – 2015-12-14 (×9): 50 mg via INTRAVENOUS
  Filled 2015-12-12 (×10): qty 2

## 2015-12-12 MED ORDER — DIPHENHYDRAMINE HCL 25 MG PO CAPS
25.0000 mg | ORAL_CAPSULE | ORAL | Status: DC | PRN
  Filled 2015-12-12: qty 1

## 2015-12-12 MED ORDER — DEXTROSE 5 % IV SOLN
1.0000 g | Freq: Two times a day (BID) | INTRAVENOUS | Status: DC
  Administered 2015-12-12 – 2015-12-13 (×4): 1 g via INTRAVENOUS
  Filled 2015-12-12 (×8): qty 1

## 2015-12-12 MED ORDER — BISACODYL 10 MG RE SUPP: 10.0000 mg | Freq: Every day | RECTAL | Status: DC | PRN

## 2015-12-12 MED ORDER — LORATADINE-PSEUDOEPHEDRINE ER 5-120 MG PO TB12: 1.0000 | ORAL_TABLET | Freq: Two times a day (BID) | ORAL | Status: DC

## 2015-12-12 MED ORDER — POLYETHYLENE GLYCOL 3350 17 G PO PACK
17.0000 g | PACK | Freq: Every day | ORAL | Status: DC
  Administered 2015-12-12: 17 g via ORAL
  Filled 2015-12-12 (×2): qty 1

## 2015-12-12 MED ORDER — ROPINIROLE HCL 1 MG PO TABS
0.5000 mg | ORAL_TABLET | Freq: Every day | ORAL | Status: DC
  Filled 2015-12-12 (×3): qty 1

## 2015-12-12 MED ORDER — MUPIROCIN 2 % EX OINT
1.0000 "application " | TOPICAL_OINTMENT | Freq: Two times a day (BID) | CUTANEOUS | Status: DC
  Administered 2015-12-12 – 2015-12-13 (×4): 1 via NASAL
  Filled 2015-12-12 (×2): qty 22

## 2015-12-12 MED ORDER — CHLORHEXIDINE GLUCONATE CLOTH 2 % EX PADS
6.0000 | MEDICATED_PAD | Freq: Every day | CUTANEOUS | Status: DC
  Administered 2015-12-12 – 2015-12-13 (×2): 6 via TOPICAL

## 2015-12-12 MED ORDER — LORATADINE 10 MG PO TABS
10.0000 mg | ORAL_TABLET | Freq: Every day | ORAL | Status: DC
  Administered 2015-12-12 – 2015-12-14 (×3): 10 mg via ORAL
  Filled 2015-12-12 (×3): qty 1

## 2015-12-12 MED ORDER — LINEZOLID 600 MG/300ML IV SOLN
600.0000 mg | Freq: Two times a day (BID) | INTRAVENOUS | Status: DC
  Administered 2015-12-12 – 2015-12-13 (×5): 600 mg via INTRAVENOUS
  Filled 2015-12-12 (×8): qty 300

## 2015-12-12 MED ORDER — PSEUDOEPHEDRINE HCL ER 120 MG PO TB12
120.0000 mg | ORAL_TABLET | Freq: Two times a day (BID) | ORAL | Status: DC
  Administered 2015-12-12: 120 mg via ORAL
  Filled 2015-12-12: qty 1

## 2015-12-12 MED ORDER — ENOXAPARIN SODIUM 40 MG/0.4ML ~~LOC~~ SOLN: 40.0000 mg | SUBCUTANEOUS | Status: DC

## 2015-12-12 MED ORDER — POLYSACCHARIDE IRON COMPLEX 150 MG PO CAPS
150.0000 mg | ORAL_CAPSULE | Freq: Every day | ORAL | Status: DC
  Administered 2015-12-12: 150 mg via ORAL
  Filled 2015-12-12: qty 1

## 2015-12-12 MED ORDER — BISACODYL 10 MG RE SUPP: 10.0000 mg | Freq: Once | RECTAL | Status: DC

## 2015-12-12 MED ORDER — BISACODYL 10 MG RE SUPP
10.0000 mg | Freq: Every day | RECTAL | Status: DC
  Filled 2015-12-12: qty 1

## 2015-12-12 MED ORDER — SIMETHICONE 80 MG PO CHEW
125.0000 mg | CHEWABLE_TABLET | Freq: Three times a day (TID) | ORAL | Status: DC
  Administered 2015-12-12: 120 mg via ORAL
  Filled 2015-12-12 (×2): qty 2

## 2015-12-12 MED ORDER — MILK AND MOLASSES ENEMA
1.0000 | Freq: Once | RECTAL | Status: DC
  Filled 2015-12-12 (×2): qty 250

## 2015-12-12 MED ORDER — METHYLNALTREXONE BROMIDE 12 MG/0.6ML ~~LOC~~ SOLN
12.0000 mg | SUBCUTANEOUS | Status: DC
  Administered 2015-12-12: 12 mg via SUBCUTANEOUS
  Filled 2015-12-12: qty 0.6

## 2015-12-12 MED ORDER — GUAIFENESIN-DM 100-10 MG/5ML PO SYRP: 5.0000 mL | ORAL_SOLUTION | ORAL | Status: DC | PRN

## 2015-12-12 MED ORDER — ACETAMINOPHEN 325 MG PO TABS: 650.0000 mg | ORAL_TABLET | ORAL | Status: DC | PRN

## 2015-12-12 MED ORDER — BIOTENE DRY MOUTH MT LIQD: 15.0000 mL | Freq: Every day | OROMUCOSAL | Status: DC | PRN

## 2015-12-12 MED ORDER — LORAZEPAM 0.5 MG PO TABS
0.5000 mg | ORAL_TABLET | Freq: Four times a day (QID) | ORAL | Status: DC | PRN
  Administered 2015-12-12 – 2015-12-13 (×3): 0.5 mg via ORAL
  Filled 2015-12-12 (×3): qty 1

## 2015-12-12 MED ORDER — SODIUM CHLORIDE 0.9 % IV BOLUS (SEPSIS)
1000.0000 mL | Freq: Once | INTRAVENOUS | Status: AC
  Administered 2015-12-12: 1000 mL via INTRAVENOUS

## 2015-12-12 MED ORDER — IPRATROPIUM-ALBUTEROL 0.5-2.5 (3) MG/3ML IN SOLN: 3.0000 mL | RESPIRATORY_TRACT | Status: DC | PRN

## 2015-12-12 MED ORDER — FENTANYL 75 MCG/HR TD PT72
75.0000 ug | MEDICATED_PATCH | TRANSDERMAL | Status: DC
  Administered 2015-12-12: 75 ug via TRANSDERMAL
  Filled 2015-12-12: qty 1
  Filled 2015-12-12: qty 3

## 2015-12-12 MED ORDER — BACLOFEN 10 MG PO TABS
10.0000 mg | ORAL_TABLET | Freq: Four times a day (QID) | ORAL | Status: DC
  Administered 2015-12-12 – 2015-12-14 (×10): 10 mg via ORAL
  Filled 2015-12-12 (×11): qty 1

## 2015-12-12 MED ORDER — GUAIFENESIN ER 600 MG PO TB12
600.0000 mg | ORAL_TABLET | Freq: Two times a day (BID) | ORAL | Status: DC
  Administered 2015-12-12 – 2015-12-14 (×3): 600 mg via ORAL
  Filled 2015-12-12 (×4): qty 1

## 2015-12-12 MED ORDER — ONDANSETRON HCL 4 MG PO TABS: 4.0000 mg | ORAL_TABLET | Freq: Four times a day (QID) | ORAL | Status: DC | PRN

## 2015-12-12 MED ORDER — PREGABALIN 100 MG PO CAPS
100.0000 mg | ORAL_CAPSULE | Freq: Two times a day (BID) | ORAL | Status: DC
  Administered 2015-12-12 – 2015-12-14 (×5): 100 mg via ORAL
  Filled 2015-12-12 (×5): qty 1

## 2015-12-12 MED ORDER — ENSURE ENLIVE PO LIQD
237.0000 mL | Freq: Two times a day (BID) | ORAL | Status: DC
Start: 2015-12-12 — End: 2015-12-13
  Administered 2015-12-12 – 2015-12-13 (×2): 237 mL via ORAL

## 2015-12-12 NOTE — Progress Notes (Signed)
RT came to do flutter valve with pt to assist pt in helping him cough stuff up. Pt educated on the flutter valve and states he knows what the flutter valve is and cant do it. At this time pt seems agitated and refuses flutter. Flutter placed at pts bedside. Pt in no distress.

## 2015-12-12 NOTE — ED Notes (Signed)
ICU MD in room 

## 2015-12-12 NOTE — Progress Notes (Signed)
Pharmacy Antibiotic Note  Alan Warner is a 55 y.o. male admitted on 12/11/2015 from NH with HCAP, recurrent UTI.  Pharmacy has been consulted for Cefepime and Vancomycin dosing. Pt with h/o vanc allergy listed as rash; discussed with Dr. Molli KnockYacoub and wants to change to Zyvox instead. Noted pt with unspecified PCN allergy but has tolerated Cefepime in past.  Noted pt is paraplegic so SCr not reflective of true renal function. Noted pt with chronic suprapubic cath.  Plan: Zyvox 600mg  IV q12h (per telephone order from Dr. Molli KnockYacoub) Cefepime 1gm IV q12h Will f/u micro data, renal function, and pt's clinical condition  Height: 6\' 2"  (188 cm) Weight: 165 lb (74.844 kg) IBW/kg (Calculated) : 82.2  Temp (24hrs), Avg:100.1 F (37.8 C), Min:99.2 F (37.3 C), Max:100.9 F (38.3 C)   Recent Labs Lab 12/11/15 2107 12/11/15 2122  WBC 5.0  --   CREATININE 0.85  --   LATICACIDVEN  --  2.18*    Estimated Creatinine Clearance: 106.3 mL/min (by C-G formula based on Cr of 0.85).    Allergies  Allergen Reactions  . Levaquin [Levofloxacin In D5w] Other (See Comments)    PER MEDICATION MAR  . Penicillins Other (See Comments)    PER MEDICATION MAR  . Vancomycin Rash    PER MEDICATION MAR    Antimicrobials this admission: 6/28 Cefepime >>  6/28 Zyvox >>   Microbiology results: 6/27 BCx x2:  6/27 UCx:    Thank you for allowing pharmacy to be a part of this patient's care.  Christoper Fabianaron Trisha Morandi, PharmD, BCPS Clinical pharmacist, pager [phone] 12/12/2015 12:52 AM

## 2015-12-12 NOTE — ED Notes (Signed)
Attempted to call report

## 2015-12-12 NOTE — Care Management Important Message (Signed)
Important Message  Patient Details  Name: Glenice LaineJerry W Zenz MRN: [id number] Date of Birth: September 29, 1960   Medicare Important Message Given:  Yes    Bernadette HoitShoffner, Illona Bulman Coleman 12/12/2015, 10:07 AM

## 2015-12-12 NOTE — ED Notes (Signed)
Patient transported to X-ray 
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
## 2015-12-12 NOTE — Consult Note (Addendum)
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 [facility].  Pt is angry and confrontational upon entering the room and states "you woke me up.  I do not want you to look at any wounds." Declines wound consult to be performed at this time despite providing encouragement. Assessment obtained from bedside nurse for wound appearance and locations; unable to provide measurements. Wound type: Sacrum had a stage 3 pressure injury noted during previous admission, this appears to have greatly improved at this time and it is reported to be red and shallow, without odor and with minimal amt tan drainage.   Right heel is reported to be an unstagable pressure injury, black eschar without odor or drainage. Right leg is reported to have a full thickness wound with a dark-colored wound bed. Pressure Ulcer POA: Yes Dressing procedure/placement/frequency: Foam dressing to sacrum to protect and promote healing to sacrum.  It is best practice to leave dry stable eschar intact to right heel.  Moist gauze dressing to right leg wound. Air mattress to reduce pressure and heel lift boots for pressure reduction.  Provided topical treatment orders and discussed plan of care with bedside nurse.  Will attempt wound assessment again tomorrow if patient agrees. Cammie Mcgeeawn Aerabella Galasso MSN, RN, CWOCN, [place]AP, CNS [phone]

## 2015-12-12 NOTE — Progress Notes (Addendum)
PROGRESS NOTE    Alan Warner  ZOX:[id number] DOB: Sep 09, 1960 DOA: 12/11/2015 PCP: Eloisa Northern, MD Brief Narrative: : Alan Warner is a 55 y.o. 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 Recently seen by provider at [facility] yesterday and given cipro for PNA and UTI. Was hypotensive during that visit and so was brought to ED for further eval. ED Course: Labile BPs with SBPs in the 90s post IVF. CXR shows RLL PNA, UA shows UTI  Assessment & Plan:   Sepsis due to HCAP -BP improved, was hypotensive in ED, improved with Fluid resuscitation, continue IVF -cut down rate to 75cc/hr -continue Iv Vanc and Cefepime -FU Blood Cx -add mucinex, nebs PRN -pulm toiletiing   Possible UTI -has a chronic suprapucbic catheter -no significant change in symptoms -FU Urine cx  Chronic constipation with fecal impaction -narcotic induced atleast in part -Dc relistor due to fecal impaction, requested RN to disimpact pt -will order Dulcolax suppository and enema to relive mechanical obstruction    Paraplegia (HCC) -C6-7 injury, lives at SNF -neurogenic bladder s/p suprapubic cath    Chronic pain -on Fentanyl patch and oxycodone at SNF    Sacral decub ulcer -chronic, improving per pt report, referred to [facility] in [facility]  DVT prophylaxis:SCDs Code Status:Full Code Family Communication:No family at bedside Disposition Plan:Tx to floor  Consultants: PCCM Woc RN  Antimicrobials: Vanc/Cefepime 6/28  Subjective: Some cough, unable to cough up  Objective: Filed Vitals:   12/12/15 0330 12/12/15 0415 12/12/15 0700 12/12/15 1045  BP: 121/76 117/80 117/89 111/59  Pulse: 95 96 85 85  Temp:  98.7 F (37.1 C) 98.3 F (36.8 C) 98.5 F (36.9 C)  TempSrc:  Oral Oral Oral  Resp: Height:  6' (1.829 m)    Weight:  76.3 kg (168 lb 3.4 oz)    SpO2: 100% 100% 100% 98%    Intake/Output Summary (Last 24 hours) at 12/12/15 1522 Last data filed at 12/12/15 0745  Gross per 24 hour  Intake   4125 ml  Output    450 ml  Net   3675 ml   Filed Weights   12/11/15 2041 12/12/15 0415  Weight: 74.844 kg (165 lb) 76.3 kg (168 lb 3.4 oz)    Examination:  General exam: Appears calm and comfortable, no distress Respiratory system: ronchi at bases Cardiovascular system: S1 & S2 heard, RRR. No JVD, murmurs, rubs, gallops or clicks. No pedal edema. Gastrointestinal system: Abdomen is distended, soft and nontender. No organomegaly or masses felt. Normal bowel sounds heard. Central nervous system: Alert and oriented. No focal neurological deficits. Extremities: Paraplegic with contractures Sacrum: with decub ulcers Skin: No rashes, lesions or ulcers Psychiatry: Judgement and insight appear normal. Mood & affect appropriate.     Data Reviewed: I have personally reviewed following labs and imaging studies  CBC:  Recent Labs Lab 12/11/15 2107 12/12/15 0155  WBC 5.0 3.2*  NEUTROABS 4.1  --   HGB 10.5* 8.0*  HCT 32.9* 24.4*  MCV 78.7 78.2  PLT 126* 93*   Basic Metabolic Panel:  Recent Labs Lab 12/11/15 2107 12/12/15 0155  NA 133* 138  K 4.0 3.0*  CL 107 115*  CO2 18* 16*  GLUCOSE 93 90  BUN 14 10  CREATININE 0.85 0.61  CALCIUM 8.4* 6.0*  MG 1.7  --    GFR: Estimated Creatinine Clearance: 115.2 mL/min (by C-G formula based on Cr of 0.61). Liver Function Tests:  Recent Labs Lab 12/11/15 2107  AST 17  ALT 13*  ALKPHOS 117  BILITOT 2.1*  PROT 6.8  ALBUMIN 2.6*   No results for input(s): LIPASE, AMYLASE in the last 168 hours. No results for input(s): AMMONIA in the last 168 hours. Coagulation Profile: No results for input(s): INR, PROTIME in the last 168 hours. Cardiac Enzymes: No results for input(s): CKTOTAL, CKMB, CKMBINDEX, TROPONINI in  the last 168 hours. BNP (last 3 results) No results for input(s): PROBNP in the last 8760 hours. HbA1C: No results for input(s): HGBA1C in the last 72 hours. CBG: No results for input(s): GLUCAP in the last 168 hours. Lipid Profile: No results for input(s): CHOL, HDL, LDLCALC, TRIG, CHOLHDL, LDLDIRECT in the last 72 hours. Thyroid Function Tests: No results for input(s): TSH, T4TOTAL, FREET4, T3FREE, THYROIDAB in the last 72 hours. Anemia Panel: No results for input(s): VITAMINB12, FOLATE, FERRITIN, TIBC, IRON, RETICCTPCT in the last 72 hours. Urine analysis:    Component Value Date/Time   COLORURINE AMBER* 12/11/2015 2040   APPEARANCEUR TURBID* 12/11/2015 2040   LABSPEC 1.010 12/11/2015 2040   PHURINE 6.5 12/11/2015 2040   GLUCOSEU NEGATIVE 12/11/2015 2040   HGBUR SMALL* 12/11/2015 2040   BILIRUBINUR NEGATIVE 12/11/2015 2040   KETONESUR NEGATIVE 12/11/2015 2040   PROTEINUR NEGATIVE 12/11/2015 2040   UROBILINOGEN 1.0 04/24/2015 1418   NITRITE NEGATIVE 12/11/2015 2040   LEUKOCYTESUR LARGE* 12/11/2015 2040   Sepsis Labs: @LABRCNTIP (procalcitonin:4,lacticidven:4)  ) Recent Results (from the past 240 hour(s))  Culture, blood (Routine x 2)     Status: None (Preliminary result)   Collection Time: 12/11/15  9:06 PM  Result Value Ref Range Status   Specimen Description BLOOD RIGHT HAND  Final   Special Requests BOTTLES DRAWN AEROBIC AND ANAEROBIC 5ML  Final   Culture NO GROWTH < 12 HOURS  Final   Report Status PENDING  Incomplete  Culture, blood (Routine x 2)     Status: None (Preliminary result)   Collection Time: 12/11/15  9:09 PM  Result Value Ref Range Status   Specimen Description BLOOD LEFT HAND  Final   Special Requests IN PEDIATRIC BOTTLE 3ML  Final   Culture NO GROWTH < 12 HOURS  Final   Report Status PENDING  Incomplete  MRSA PCR Screening     Status: Abnormal   Collection Time: 12/12/15  4:15 AM  Result Value Ref Range Status   MRSA by PCR POSITIVE (A) NEGATIVE  Final    Comment: RESULT CALLED TO, READ BACK BY AND VERIFIED WITH: Ronnald NianB. Tillman RN 9:40 12/12/15 ([email or web address])        The GeneXpert MRSA Assay (FDA approved for NASAL specimens only), is one component of a comprehensive MRSA colonization surveillance program. It is not intended to diagnose MRSA infection nor to guide or monitor treatment for MRSA infections.          Radiology Studies: Dg Abd 1 View  12/12/2015  CLINICAL DATA:  Constipation. EXAM: ABDOMEN - 1 VIEW COMPARISON:  KUB July 24, 2015 and CT scan November 20, 2015 FINDINGS: There is a very large stool ball within the rectum measuring at least 27 x 19 cm. This distends the rectum and is worsened when compared to the recent CT scan. Air-filled prominent loops of colon are seen throughout. No small bowel dilatation. No  free air, portal venous gas, or pneumatosis identified. IMPRESSION: Very large stool ball in the rectum, larger since November 20, 2015, consistent with fecal impaction. The patient is at risk for stercoral colitis. This stool ball is likely resulting in mild relative partial obstruction of the colon with air-filled prominent loops of colon throughout. Electronically Signed   By: Gerome Samavid  Williams III M.D   On: 12/12/2015 01:39   Dg Chest Portable 1 View  12/11/2015  CLINICAL DATA:  55 year old paraplegic with approximate 4-5 day history of productive cough and shortness of breath. EXAM: PORTABLE CHEST 1 VIEW COMPARISON:  12/03/2015 and earlier. FINDINGS: Cardiac silhouette normal in size, unchanged. Dense airspace consolidation involving the right lower lobe silhouetting the right hemidiaphragm. Patchy airspace opacities at the left lung base. Upper lungs remain clear. Pulmonary vascularity normal. No visible pleural effusion. Marked thoracic dextroscoliosis again noted. IMPRESSION: Dense right lower lobe pneumonia and/or atelectasis and patchy pneumonia involving the left lung base. Electronically Signed   By: Hulan Saashomas  Lawrence M.D.    On: 12/11/2015 21:45        Scheduled Meds: . baclofen  10 mg Oral QID  . bisacodyl  10 mg Rectal Once  . ceFEPime (MAXIPIME) IV  1 g Intravenous Q12H  . Chlorhexidine Gluconate Cloth  6 each Topical Q0600  . feeding supplement (ENSURE ENLIVE)  237 mL Oral BID BM  . fentaNYL  75 mcg Transdermal Q72H  . hydrocortisone sod succinate (SOLU-CORTEF) inj  50 mg Intravenous Q6H  . iron polysaccharides  150 mg Oral Daily  . linezolid (ZYVOX) IV  600 mg Intravenous Q12H  . loratadine  10 mg Oral Daily  . mupirocin ointment  1 application Nasal BID  . polyethylene glycol  17 g Oral Daily  . potassium chloride  40 mEq Oral BID  . pregabalin  100 mg Oral BID  . rOPINIRole  0.5 mg Oral QHS   Continuous Infusions: . sodium chloride 75 mL/hr at 12/12/15 1305     LOS: 0 days    Time spent: 35min    Zannie CovePreetha Halley Kincer, MD [facility] Pager [phone]  If 7PM-7AM, please contact night-coverage [email or web address] Password [email or web address] 12/12/2015, 3:22 PM

## 2015-12-13 DIAGNOSIS — K5900 Constipation, unspecified: Secondary | ICD-10-CM | POA: Diagnosis not present

## 2015-12-13 DIAGNOSIS — L8990 Pressure ulcer of unspecified site, unspecified stage: Secondary | ICD-10-CM | POA: Diagnosis not present

## 2015-12-13 DIAGNOSIS — G8929 Other chronic pain: Secondary | ICD-10-CM | POA: Diagnosis not present

## 2015-12-13 DIAGNOSIS — N390 Urinary tract infection, site not specified: Secondary | ICD-10-CM | POA: Diagnosis not present

## 2015-12-13 DIAGNOSIS — J189 Pneumonia, unspecified organism: Secondary | ICD-10-CM | POA: Diagnosis not present

## 2015-12-13 LAB — BLOOD CULTURE ID PANEL (REFLEXED)
ACINETOBACTER BAUMANNII: NOT DETECTED
CANDIDA GLABRATA: NOT DETECTED
CANDIDA TROPICALIS: NOT DETECTED
CARBAPENEM RESISTANCE: NOT DETECTED
Candida albicans: NOT DETECTED
Candida krusei: NOT DETECTED
Candida parapsilosis: NOT DETECTED
ENTEROCOCCUS SPECIES: NOT DETECTED
ESCHERICHIA COLI: NOT DETECTED
Enterobacter cloacae complex: NOT DETECTED
Enterobacteriaceae species: NOT DETECTED
HAEMOPHILUS INFLUENZAE: NOT DETECTED
Klebsiella oxytoca: NOT DETECTED
Klebsiella pneumoniae: NOT DETECTED
LISTERIA MONOCYTOGENES: NOT DETECTED
METHICILLIN RESISTANCE: NOT DETECTED
NEISSERIA MENINGITIDIS: NOT DETECTED
PROTEUS SPECIES: NOT DETECTED
Pseudomonas aeruginosa: NOT DETECTED
SERRATIA MARCESCENS: NOT DETECTED
STAPHYLOCOCCUS AUREUS BCID: NOT DETECTED
STAPHYLOCOCCUS SPECIES: NOT DETECTED
STREPTOCOCCUS PYOGENES: NOT DETECTED
STREPTOCOCCUS SPECIES: NOT DETECTED
Streptococcus agalactiae: NOT DETECTED
Streptococcus pneumoniae: NOT DETECTED
VANCOMYCIN RESISTANCE: NOT DETECTED

## 2015-12-13 LAB — CBC
HCT: 30.2 % — ABNORMAL LOW (ref 39.0–52.0)
HEMOGLOBIN: 10 g/dL — AB (ref 13.0–17.0)
MCH: 26.2 pg (ref 26.0–34.0)
MCHC: 33.1 g/dL (ref 30.0–36.0)
MCV: 79.1 fL (ref 78.0–100.0)
PLATELETS: 130 10*3/uL — AB (ref 150–400)
RBC: 3.82 MIL/uL — ABNORMAL LOW (ref 4.22–5.81)
RDW: 16.2 % — ABNORMAL HIGH (ref 11.5–15.5)
WBC: 5.5 10*3/uL (ref 4.0–10.5)

## 2015-12-13 LAB — BASIC METABOLIC PANEL
ANION GAP: 9 (ref 5–15)
BUN: 11 mg/dL (ref 6–20)
CHLORIDE: 113 mmol/L — AB (ref 101–111)
CO2: 17 mmol/L — ABNORMAL LOW (ref 22–32)
Calcium: 8.4 mg/dL — ABNORMAL LOW (ref 8.9–10.3)
Creatinine, Ser: 0.66 mg/dL (ref 0.61–1.24)
GFR calc Af Amer: >60 mL/min (ref >60)
Glucose, Bld: 101 mg/dL — ABNORMAL HIGH (ref 65–99)
POTASSIUM: 3.8 mmol/L (ref 3.5–5.1)
SODIUM: 139 mmol/L (ref 135–145)

## 2015-12-13 LAB — PHOSPHORUS: Phosphorus: 2.1 mg/dL — ABNORMAL LOW (ref 2.5–4.6)

## 2015-12-13 LAB — MAGNESIUM: MAGNESIUM: 1.8 mg/dL (ref 1.7–2.4)

## 2015-12-13 MED ORDER — ENSURE ENLIVE PO LIQD
237.0000 mL | Freq: Three times a day (TID) | ORAL | Status: DC
  Administered 2015-12-13 – 2015-12-14 (×2): 237 mL via ORAL

## 2015-12-13 MED ORDER — PRO-STAT SUGAR FREE PO LIQD
30.0000 mL | Freq: Two times a day (BID) | ORAL | Status: DC
  Filled 2015-12-13 (×2): qty 30

## 2015-12-13 NOTE — Consult Note (Addendum)
WOC follow-up: Attempted to assess wounds today and patient declined wound consult again.  He states  "I only have one wound and it is on my butt and doing better." He denies any wounds to his right leg or right heel, which were noted in the flowsheet yesterday by the nursing staff.  Bedside nurse states she will assess these locations later and revise the flowsheet if necessary.  Topical treatment orders were provided yesterday.   Please re-consult if further assistance is needed.  Thank-you,  Cammie Mcgeeawn Jacobey Gura MSN, RN, CWOCN, [place]CN-AP, CNS [phone]

## 2015-12-13 NOTE — Progress Notes (Signed)
  PHARMACY - PHYSICIAN COMMUNICATION CRITICAL VALUE ALERT - BLOOD CULTURE IDENTIFICATION (BCID)  Results for orders placed or performed during the hospital encounter of 12/11/15  Blood Culture ID Panel (Reflexed) (Collected: 12/11/2015  9:06 PM)  Result Value Ref Range   Enterococcus species NOT DETECTED NOT DETECTED   Vancomycin resistance NOT DETECTED NOT DETECTED   Listeria monocytogenes NOT DETECTED NOT DETECTED   Staphylococcus species NOT DETECTED NOT DETECTED   Staphylococcus aureus NOT DETECTED NOT DETECTED   Methicillin resistance NOT DETECTED NOT DETECTED   Streptococcus species NOT DETECTED NOT DETECTED   Streptococcus agalactiae NOT DETECTED NOT DETECTED   Streptococcus pneumoniae NOT DETECTED NOT DETECTED   Streptococcus pyogenes NOT DETECTED NOT DETECTED   Acinetobacter baumannii NOT DETECTED NOT DETECTED   Enterobacteriaceae species NOT DETECTED NOT DETECTED   Enterobacter cloacae complex NOT DETECTED NOT DETECTED   Escherichia coli NOT DETECTED NOT DETECTED   Klebsiella oxytoca NOT DETECTED NOT DETECTED   Klebsiella pneumoniae NOT DETECTED NOT DETECTED   Proteus species NOT DETECTED NOT DETECTED   Serratia marcescens NOT DETECTED NOT DETECTED   Carbapenem resistance NOT DETECTED NOT DETECTED   Haemophilus influenzae NOT DETECTED NOT DETECTED   Neisseria meningitidis NOT DETECTED NOT DETECTED   Pseudomonas aeruginosa NOT DETECTED NOT DETECTED   Candida albicans NOT DETECTED NOT DETECTED   Candida glabrata NOT DETECTED NOT DETECTED   Candida krusei NOT DETECTED NOT DETECTED   Candida parapsilosis NOT DETECTED NOT DETECTED   Candida tropicalis NOT DETECTED NOT DETECTED    Name of physician (or Provider) Contacted: Dr. Jomarie LongsJoseph  Changes to prescribed antibiotics required: 1 out of 2 blood cultures positive with GPC in clusters but BCID is negative. Pt on Zyvox and cefepime. No interventions needed.  Cassie L. Roseanne RenoStewart, PharmD PGY2 Infectious Diseases Pharmacy  Resident Pager: [phone] 12/13/2015 8:44 AM

## 2015-12-13 NOTE — Progress Notes (Signed)
Patient requested for pain medication and it was not due time.Patient rated pain 10/10. RN informed patient that the pain is as needed every 6 hours. Patient got upset and said the doctor was supposed to change it to every 4 hours and he knows when his medication is due. When it was due time, RN brought pain medication to patient. Patient found sound asleep. RN called patient's name three times and patient did not wake up. RN will continue to monitor patient.  Veatrice KellsMahmoud,Shakaria Raphael I, RN

## 2015-12-13 NOTE — Progress Notes (Signed)
Initial Nutrition Assessment  DOCUMENTATION CODES:   Not applicable  INTERVENTION:  Recommend providing adaptive utensils to aid in po intake.   Provide Ensure Enlive po TID, each supplement provides 350 kcal and 20 grams of protein.  Provide 30 ml Prostat po BID, each supplement provides 100 kcal and 15 grams of protein.   Encourage adequate PO intake.   NUTRITION DIAGNOSIS:   Inadequate oral intake related to poor appetite as evidenced by meal completion < 25%.  GOAL:   Patient will meet greater than or equal to 90% of their needs  MONITOR:   PO intake, Supplement acceptance, Weight trends, Labs, I & O's, Skin  REASON FOR ASSESSMENT:   Low Braden    ASSESSMENT:   55 y.o. 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 [facility] and given cipro for PNA and UTI. Was hypotensive during that visit and so was brought to ED for further eval. CXR shows RLL PNA, UA shows UTI.  Pt reports abdominal pains which has been ongoing since Tuesday 6/27. He reports abdominal pains are worsened by food. Meal completion has been 0%. Pt however has Ensure ordered with varied intake. He reports the Ensure Shakes do not make his stomach hurt. RD to increase Ensure ordered to aid in caloric and protein needs. RD to additionally order Prostat to aid in wound healing. Pt reports eating well prior to abdominal pains. Last meal consumed reported to be on Monday 6/26. Per Epic weight records, pt with a 9.6% weight loss in 4 months. Unable to complete Nutrition-Focused physical exam at this time as pt refused. Noted pt with paraplegia. Pt reports if he has adaptive utensils, it will help with po intake at meals. Recommend providing adaptive utensils for meals.   Labs and  medications reviewed. Potassium and magnesium WNL. Phosphorous low at 2.1.  Diet Order:  Diet Heart Room service appropriate?: Yes; Fluid consistency:: Thin  Skin:  Wound (see comment) (Unstageable pressure ulcer on R heel, Stage II on sacrum)  Last BM:  6/29  Height:   Ht Readings from Last 1 Encounters:  12/12/15 6' (1.829 m)    Weight:   Wt Readings from Last 1 Encounters:  12/12/15 169 lb 12.1 oz (77 kg)    Ideal Body Weight:  75.3 kg (adjusted for paraplegia)  BMI:  Body mass index is 23.02 kg/(m^2).  Estimated Nutritional Needs:   Kcal:  2100-2300  Protein:  100-115 grams  Fluid:  2.1 - 2.3 L/day  EDUCATION NEEDS:   No education needs identified at this time  Roslyn SmilingStephanie Colonel Krauser, MS, RD, LDN Pager # [phone] After hours/ weekend pager # [phone]

## 2015-12-13 NOTE — Progress Notes (Addendum)
PROGRESS NOTE    Alan LaineJerry W Fleischhacker  ZOX:[id number] DOB: July 07, 1960 DOA: 12/11/2015 PCP: Eloisa NorthernAMIN, [name], MD Brief Narrative: : Alan Warner is a 55 y.o. 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 [facility] and given cipro for PNA and UTI. Was hypotensive during that visit and so was brought to ED for further eval. ED Course: Labile BPs with SBPs in the 90s post IVF. CXR shows RLL PNA, UA shows UTI  Assessment & Plan:   Sepsis due to HCAP -BP improved, was hypotensive in ED, improved with Fluid resuscitation, stop IVF -continue IV Zyvox and Cefepime -1/2 Blood Cx with GPC in clusters -continue mucinex, nebs PRN -pulm toileting -sepsis physiology resolved   Possible UTI -has a chronic suprapucbic catheter -no significant change in symptoms -Urine cx with Ecoli and GPC  Chronic constipation with fecal impaction -narcotic induced primarily -stopped  relistor due to fecal impaction, -finally having BMs now, continue senokot and miralax    Paraplegia (HCC) -C6-7 injury, lives at SNF -neurogenic bladder s/p suprapubic cath    Chronic pain -on Fentanyl patch and oxycodone at SNF    Sacral decub ulcer -chronic, improving per pt report, [facility]  DVT prophylaxis:SCDs Code Status:Full Code Family Communication:No family at bedside Disposition Plan: back to SNF in 1-2days pending Cx data  Consultants: PCCM Woc RN  Antimicrobials: Vanc/Cefepime 6/28  Subjective:feels better today, finally having BMs  Objective: Filed Vitals:   12/12/15 1045 12/12/15 1640 12/12/15 2207 12/13/15 0800  BP: 111/59 147/106 143/96 122/110  Pulse: 85 98 93 78  Temp: 98.5 F (36.9 C) 98.1 F (36.7 C) 98.3 F (36.8 C) 96.3 F (35.7 C)   TempSrc: Oral Oral Oral Axillary  Resp: 20 20 18 18   Height:      Weight:   77 kg (169 lb 12.1 oz)   SpO2: 98% 95% 98% 98%    Intake/Output Summary (Last 24 hours) at 12/13/15 1323 Last data filed at 12/13/15 1040  Gross per 24 hour  Intake 4064.17 ml  Output   1890 ml  Net 2174.17 ml   Filed Weights   12/11/15 2041 12/12/15 0415 12/12/15 2207  Weight: 74.844 kg (165 lb) 76.3 kg (168 lb 3.4 oz) 77 kg (169 lb 12.1 oz)    Examination:  General exam: Appears calm and comfortable, no distress Respiratory system: ronchi at bases Cardiovascular system: S1 & S2 heard, RRR. No JVD, murmurs, rubs, gallops or clicks. No pedal edema. Gastrointestinal system: Abdomen is less distended, soft and nontender. No organomegaly or masses felt. Normal bowel sounds  Central nervous system: Alert and oriented. No focal neurological deficits. Extremities: Paraplegic with contractures in hands Sacrum: with decub ulcers Skin: No rashes, lesions or ulcers Psychiatry: Judgement and insight appear normal. Mood & affect appropriate.     Data Reviewed: I have personally reviewed following labs and imaging studies  CBC:  Recent Labs Lab 12/11/15 2107 12/12/15 0155 12/13/15 0538  WBC 5.0 3.2* 5.5  NEUTROABS 4.1  --   --   HGB 10.5* 8.0* 10.0*  HCT 32.9* 24.4* 30.2*  MCV 78.7 78.2 79.1  PLT 126* 93* 130*   Basic Metabolic Panel:  Recent Labs Lab 12/11/15 2107 12/12/15 0155 12/13/15 0538  NA 133* 138  139  K 4.0 3.0* 3.8  CL 107 115* 113*  CO2 18* 16* 17*  GLUCOSE 93 90 101*  BUN 14 10 11   CREATININE 0.85 0.61 0.66  CALCIUM 8.4* 6.0* 8.4*  MG 1.7  --  1.8  PHOS  --   --  2.1*   GFR: Estimated Creatinine Clearance: 116.3 mL/min (by C-G formula based on Cr of 0.66). Liver Function Tests:  Recent Labs Lab 12/11/15 2107  AST 17  ALT 13*  ALKPHOS 117  BILITOT 2.1*  PROT 6.8  ALBUMIN 2.6*   No results for input(s): LIPASE, AMYLASE in the last 168 hours. No results for  input(s): AMMONIA in the last 168 hours. Coagulation Profile: No results for input(s): INR, PROTIME in the last 168 hours. Cardiac Enzymes: No results for input(s): CKTOTAL, CKMB, CKMBINDEX, TROPONINI in the last 168 hours. BNP (last 3 results) No results for input(s): PROBNP in the last 8760 hours. HbA1C: No results for input(s): HGBA1C in the last 72 hours. CBG: No results for input(s): GLUCAP in the last 168 hours. Lipid Profile: No results for input(s): CHOL, HDL, LDLCALC, TRIG, CHOLHDL, LDLDIRECT in the last 72 hours. Thyroid Function Tests: No results for input(s): TSH, T4TOTAL, FREET4, T3FREE, THYROIDAB in the last 72 hours. Anemia Panel: No results for input(s): VITAMINB12, FOLATE, FERRITIN, TIBC, IRON, RETICCTPCT in the last 72 hours. Urine analysis:    Component Value Date/Time   COLORURINE AMBER* 12/11/2015 2040   APPEARANCEUR TURBID* 12/11/2015 2040   LABSPEC 1.010 12/11/2015 2040   PHURINE 6.5 12/11/2015 2040   GLUCOSEU NEGATIVE 12/11/2015 2040   HGBUR SMALL* 12/11/2015 2040   BILIRUBINUR NEGATIVE 12/11/2015 2040   KETONESUR NEGATIVE 12/11/2015 2040   PROTEINUR NEGATIVE 12/11/2015 2040   UROBILINOGEN 1.0 04/24/2015 1418   NITRITE NEGATIVE 12/11/2015 2040   LEUKOCYTESUR LARGE* 12/11/2015 2040   Sepsis Labs: @LABRCNTIP (procalcitonin:4,lacticidven:4)  ) Recent Results (from the past 240 hour(s))  Urine culture     Status: Abnormal (Preliminary result)   Collection Time: 12/11/15  8:40 PM  Result Value Ref Range Status   Specimen Description URINE, CATHETERIZED  Final   Special Requests NONE  Final   Culture (A)  Final    >=100,000 COLONIES/mL ESCHERICHIA COLI >=100,000 COLONIES/mL GRAM POSITIVE COCCI    Report Status PENDING  Incomplete  Culture, blood (Routine x 2)     Status: None (Preliminary result)   Collection Time: 12/11/15  9:06 PM  Result Value Ref Range Status   Specimen Description BLOOD RIGHT HAND  Final   Special Requests BOTTLES DRAWN  AEROBIC AND ANAEROBIC  Final   Culture  Setup Time   Final    GRAM POSITIVE COCCI IN CLUSTERS ANAEROBIC BOTTLE ONLY Organism ID to follow CRITICAL RESULT CALLED TO, READ BACK BY AND VERIFIED WITH: K. HIATT,PHARM AT 0802 ON [id number] BY Lucienne Capers    Culture TOO YOUNG TO READ  Final   Report Status PENDING  Incomplete  Blood Culture ID Panel (Reflexed)     Status: None   Collection Time: 12/11/15  9:06 PM  Result Value Ref Range Status   Enterococcus species NOT DETECTED NOT DETECTED Final   Vancomycin resistance NOT DETECTED NOT DETECTED Final   Listeria monocytogenes NOT DETECTED NOT DETECTED Final   Staphylococcus species NOT DETECTED NOT DETECTED Final   Staphylococcus aureus NOT DETECTED NOT DETECTED Final   Methicillin resistance NOT DETECTED NOT DETECTED Final   Streptococcus species NOT DETECTED NOT DETECTED Final   Streptococcus agalactiae NOT DETECTED NOT  DETECTED Final   Streptococcus pneumoniae NOT DETECTED NOT DETECTED Final   Streptococcus pyogenes NOT DETECTED NOT DETECTED Final   Acinetobacter baumannii NOT DETECTED NOT DETECTED Final   Enterobacteriaceae species NOT DETECTED NOT DETECTED Final   Enterobacter cloacae complex NOT DETECTED NOT DETECTED Final   Escherichia coli NOT DETECTED NOT DETECTED Final   Klebsiella oxytoca NOT DETECTED NOT DETECTED Final   Klebsiella pneumoniae NOT DETECTED NOT DETECTED Final   Proteus species NOT DETECTED NOT DETECTED Final   Serratia marcescens NOT DETECTED NOT DETECTED Final   Carbapenem resistance NOT DETECTED NOT DETECTED Final   Haemophilus influenzae NOT DETECTED NOT DETECTED Final   Neisseria meningitidis NOT DETECTED NOT DETECTED Final   Pseudomonas aeruginosa NOT DETECTED NOT DETECTED Final   Candida albicans NOT DETECTED NOT DETECTED Final   Candida glabrata NOT DETECTED NOT DETECTED Final   Candida krusei NOT DETECTED NOT DETECTED Final   Candida parapsilosis NOT DETECTED NOT DETECTED Final   Candida tropicalis  NOT DETECTED NOT DETECTED Final  Culture, blood (Routine x 2)     Status: None (Preliminary result)   Collection Time: 12/11/15  9:09 PM  Result Value Ref Range Status   Specimen Description BLOOD LEFT HAND  Final   Special Requests IN PEDIATRIC BOTTLE 3ML  Final   Culture NO GROWTH 2 DAYS  Final   Report Status PENDING  Incomplete  MRSA PCR Screening     Status: Abnormal   Collection Time: 12/12/15  4:15 AM  Result Value Ref Range Status   MRSA by PCR POSITIVE (A) NEGATIVE Final    Comment: RESULT CALLED TO, READ BACK BY AND VERIFIED WITH: Ronnald NianB. Tillman RN 9:40 12/12/15 ([email or web address])        The GeneXpert MRSA Assay (FDA approved for NASAL specimens only), is one component of a comprehensive MRSA colonization surveillance program. It is not intended to diagnose MRSA infection nor to guide or monitor treatment for MRSA infections.          Radiology Studies: Dg Abd 1 View  12/12/2015  CLINICAL DATA:  Constipation. EXAM: ABDOMEN - 1 VIEW COMPARISON:  KUB July 24, 2015 and CT scan November 20, 2015 FINDINGS: There is a very large stool ball within the rectum measuring at least 27 x 19 cm. This distends the rectum and is worsened when compared to the recent CT scan. Air-filled prominent loops of colon are seen throughout. No small bowel dilatation. No free air, portal venous gas, or pneumatosis identified. IMPRESSION: Very large stool ball in the rectum, larger since November 20, 2015, consistent with fecal impaction. The patient is at risk for stercoral colitis. This stool ball is likely resulting in mild relative partial obstruction of the colon with air-filled prominent loops of colon throughout. Electronically Signed   By: Gerome Samavid  Williams III M.D   On: 12/12/2015 01:39   Dg Chest Portable 1 View  12/11/2015  CLINICAL DATA:  55 year old paraplegic with approximate 4-5 day history of productive cough and shortness of breath. EXAM: PORTABLE CHEST 1 VIEW COMPARISON:  12/03/2015 and earlier.  FINDINGS: Cardiac silhouette normal in size, unchanged. Dense airspace consolidation involving the right lower lobe silhouetting the right hemidiaphragm. Patchy airspace opacities at the left lung base. Upper lungs remain clear. Pulmonary vascularity normal. No visible pleural effusion. Marked thoracic dextroscoliosis again noted. IMPRESSION: Dense right lower lobe pneumonia and/or atelectasis and patchy pneumonia involving the left lung base. Electronically Signed   By: Hulan Saashomas  Lawrence M.D.   On: 12/11/2015 21:45  Scheduled Meds: . baclofen  10 mg Oral QID  . bisacodyl  10 mg Rectal Once  . ceFEPime (MAXIPIME) IV  1 g Intravenous Q12H  . Chlorhexidine Gluconate Cloth  6 each Topical Q0600  . feeding supplement (ENSURE ENLIVE)  237 mL Oral BID BM  . fentaNYL  75 mcg Transdermal Q72H  . guaiFENesin  600 mg Oral BID  . hydrocortisone sod succinate (SOLU-CORTEF) inj  50 mg Intravenous Q6H  . linezolid (ZYVOX) IV  600 mg Intravenous Q12H  . loratadine  10 mg Oral Daily  . milk and molasses  1 enema Rectal Once  . mupirocin ointment  1 application Nasal BID  . polyethylene glycol  17 g Oral Daily  . potassium chloride  40 mEq Oral BID  . pregabalin  100 mg Oral BID  . rOPINIRole  0.5 mg Oral QHS   Continuous Infusions: . sodium chloride 75 mL/hr at 12/13/15 0515     LOS: 1 day    Time spent:    Zannie Cove, MD [facility] Pager [phone]  If 7PM-7AM, please contact night-coverage [email or web address] Password [email or web address] 12/13/2015, 1:23 PM

## 2015-12-14 DIAGNOSIS — K5900 Constipation, unspecified: Secondary | ICD-10-CM | POA: Diagnosis not present

## 2015-12-14 DIAGNOSIS — L8990 Pressure ulcer of unspecified site, unspecified stage: Secondary | ICD-10-CM

## 2015-12-14 DIAGNOSIS — J189 Pneumonia, unspecified organism: Secondary | ICD-10-CM | POA: Diagnosis not present

## 2015-12-14 DIAGNOSIS — N390 Urinary tract infection, site not specified: Secondary | ICD-10-CM | POA: Diagnosis not present

## 2015-12-14 LAB — BASIC METABOLIC PANEL
Anion gap: 6 (ref 5–15)
BUN: 12 mg/dL (ref 6–20)
CALCIUM: 8.3 mg/dL — AB (ref 8.9–10.3)
CO2: 18 mmol/L — AB (ref 22–32)
CREATININE: 0.72 mg/dL (ref 0.61–1.24)
Chloride: 116 mmol/L — ABNORMAL HIGH (ref 101–111)
GFR calc Af Amer: >60 mL/min (ref >60)
GFR calc non Af Amer: >60 mL/min (ref >60)
GLUCOSE: 97 mg/dL (ref 65–99)
Potassium: 4.3 mmol/L (ref 3.5–5.1)
Sodium: 140 mmol/L (ref 135–145)

## 2015-12-14 LAB — CBC
HEMATOCRIT: 24.4 % — AB (ref 39.0–52.0)
HEMATOCRIT: 29.8 % — AB (ref 39.0–52.0)
HEMOGLOBIN: 8 g/dL — AB (ref 13.0–17.0)
Hemoglobin: 9.7 g/dL — ABNORMAL LOW (ref 13.0–17.0)
MCH: 25.6 pg — ABNORMAL LOW (ref 26.0–34.0)
MCH: 26.1 pg (ref 26.0–34.0)
MCHC: 32.8 g/dL (ref 30.0–36.0)
MCHC: 32.6 g/dL (ref 30.0–36.0)
MCV: 78.2 fL (ref 78.0–100.0)
MCV: 80.1 fL (ref 78.0–100.0)
PLATELETS: 136 10*3/uL — AB (ref 150–400)
Platelets: 93 10*3/uL — ABNORMAL LOW (ref 150–400)
RBC: 3.12 MIL/uL — ABNORMAL LOW (ref 4.22–5.81)
RBC: 3.72 MIL/uL — ABNORMAL LOW (ref 4.22–5.81)
RDW: 16.6 % — ABNORMAL HIGH (ref 11.5–15.5)
RDW: 16.5 % — AB (ref 11.5–15.5)
WBC: 3.2 10*3/uL — AB (ref 4.0–10.5)
WBC: 4.9 10*3/uL (ref 4.0–10.5)

## 2015-12-14 LAB — URINE CULTURE

## 2015-12-14 LAB — PROCALCITONIN: Procalcitonin: 3.94 ng/mL

## 2015-12-14 MED ORDER — CEFUROXIME AXETIL 500 MG PO TABS: 500.0000 mg | ORAL_TABLET | Freq: Two times a day (BID) | ORAL | Status: DC

## 2015-12-14 MED ORDER — OXYCODONE HCL 5 MG PO TABS
30.0000 mg | ORAL_TABLET | ORAL | Status: DC | PRN
  Administered 2015-12-14 (×2): 30 mg via ORAL
  Filled 2015-12-14 (×2): qty 6

## 2015-12-14 MED ORDER — POTASSIUM CHLORIDE CRYS ER 20 MEQ PO TBCR: 40.0000 meq | EXTENDED_RELEASE_TABLET | Freq: Every day | ORAL | Status: DC

## 2015-12-14 MED ORDER — GUAIFENESIN ER 600 MG PO TB12: 600.0000 mg | ORAL_TABLET | Freq: Two times a day (BID) | ORAL | Status: DC

## 2015-12-14 MED ORDER — LINEZOLID 600 MG PO TABS
600.0000 mg | ORAL_TABLET | Freq: Two times a day (BID) | ORAL | Status: DC
  Filled 2015-12-14: qty 1

## 2015-12-14 MED ORDER — CEFUROXIME AXETIL 500 MG PO TABS
500.0000 mg | ORAL_TABLET | Freq: Two times a day (BID) | ORAL | Status: DC
  Administered 2015-12-14 (×2): 500 mg via ORAL
  Filled 2015-12-14 (×2): qty 1

## 2015-12-14 NOTE — Clinical Social Work Note (Signed)
Clinical Social Work Assessment  Patient Details  Name: Alan Warner MRN: [id number] Date of Birth: Jun 18, 1960  Date of referral:  12/13/15               Reason for consult:  Facility Placement                Permission sought to share information with:  Family Supports Permission granted to share information::  Yes, Verbal Permission Granted  Name::     Deloris Bostic  Agency::     Relationship::  [employer]  Contact Information:  [phone]  Housing/Transportation Living arrangements for the past 2 months:  Skilled Nursing Facility [facility] Source of Information:  Patient, Other (Comment Required) (Patient chart) Patient Interpreter Needed:  None Criminal Activity/Legal Involvement Pertinent to Current Situation/Hospitalization:  No - Comment as needed Significant Relationships:  Adult Children, Parents, Other Family Members Lives with:    Do you feel safe going back to the place where you live?  Yes ([facility] Need for family participation in patient care:  Yes (Comment)  Care giving concerns:  Patient expressed no concerns to CSW regarding his care at skilled facility.   Social Worker assessment / plan:  CSW talked with patient at the bedside regarding discharge planning. [facility] and confirmed his intent to return when medically ready.   Employment status:  Disabled (Comment on whether or not currently receiving Disability) Insurance information:  Teacher, English as a foreign languageManaged Medicare, Medicaid [facility] [facility]) PT Recommendations:  Not assessed at this time Information / Referral to community resources:  Other (Comment Required) (Not requested or needed at this time as patient is from a facllity)  Patient/Family's Response to care:  Patient expressed no concerns regarding his care during hospitalization.  Patient/Family's Understanding of and Emotional Response to Diagnosis, Current Treatment, and Prognosis:  Not  discussed.  Emotional Assessment Appearance:  Appears stated age Attitude/Demeanor/Rapport:  Other (Appropriate) Affect (typically observed):  Appropriate Orientation:  Oriented to Self, Oriented to Place, Oriented to  Time, Oriented to Situation Alcohol / Substance use:  Tobacco Use, Alcohol Use, Illicit Drugs (Patient reports that he smokes cigarettes, uses illicit drugs (marijuana) and drinks alcohol) Psych involvement (Current and /or in the community):  No (Comment)  Discharge Needs  Concerns to be addressed:  No discharge needs identified Readmission within the last 30 days:  No Current discharge risk:  None Barriers to Discharge:  No Barriers Identified   Cristobal GoldmannCrawford, Daryus Sowash Bradley, LCSW 12/14/2015, 1:06 PM

## 2015-12-14 NOTE — Care Management Obs Status (Signed)
MEDICARE OBSERVATION STATUS NOTIFICATION   Patient Details  Name: Alan Warner MRN: [id number] Date of Birth: 1960-10-26   Medicare Observation Status Notification Given:  Yes  Code 44 given  Ahijah Devery, Annamarie MajorCheryl U, RN 12/14/2015, 4:51 PM

## 2015-12-14 NOTE — Clinical Social Work Note (Addendum)
[facility]. Facility informed and discharge information transmitted. Family member, niece Deloris Royetta Crochetate [phone]) contacted and message left regarding discharge.  Genelle BalVanessa Shawnna Pancake, MSW, LCSW Licensed Clinical Social Worker Clinical Social Work Department [facility] [phone]

## 2015-12-14 NOTE — Progress Notes (Signed)
Report called to [employer]. 

## 2015-12-14 NOTE — Discharge Summary (Addendum)
Physician Discharge Summary  Alan LaineJerry W Dias ZOX:[id number] DOB: 10-Apr-1961 DOA: 12/11/2015  PCP: Eloisa NorthernAMIN, [name], MD  Admit date: 12/11/2015 Discharge date: 12/14/2015  Time spent: 45 minutes  Recommendations for Outpatient Follow-up:  1. PCP Dr.Amin in 1 week, recommend daily laxative use, titrate down narcotics as tolerated 2. Please check bmet in 1 week  Discharge Diagnoses:    Sepsis   HCAP (healthcare-associated pneumonia)   Ecoli and Enterococcus UTI   Paraplegia (HCC)   Chronic pain   Complicated UTI (urinary tract infection)   Sepsis (HCC)   Sacral decub ulcer/Pressure ulcer   Constipation   Discharge Condition: stable  Diet recommendation:regular  Filed Weights   12/12/15 0415 12/12/15 2207 12/13/15 2208  Weight: 76.3 kg (168 lb 3.4 oz) 77 kg (169 lb 12.1 oz) 66.5 kg (146 lb 9.7 oz)    History of present illness:  Alan Warner is a 55 y.o. 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 [facility] and given cipro for PNA and UTI. Was hypotensive during that visit and so was brought to ED   Hospital Course:  Sepsis due to HCAP -BP improved, was hypotensive in ED, improved with Fluid resuscitation, Abx -treated with IV Zyvox and Cefepime initially -1/2 Blood Cx with GPC in clusters-which is coagulase negative and hence consistent with contaminant  -improved, sepsis physiology resolved -Abx changed to Po Cefuroxime based on Urine Cx data and to cover pulm flora  Possible UTI -has a chronic suprapubic catheter -no significant change in symptoms -Urine cx with Ecoli and Enterococcus, based on sensitivities and his PCN allergy changed to cefuroxime for 4more days  Chronic constipation with fecal impaction -narcotic induced primarily -he  declined manual disimpaction, however started having BMs after dulcolax suppository and laxatives. 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, improving and healing wound, pt reports that [facility]  Discharge Exam: Filed Vitals:   12/14/15 0536 12/14/15 1558  BP: 136/87 118/77  Pulse: 78 70  Temp: 97.8 F (36.6 C) 98.3 F (36.8 C)  Resp: 18 18    General: AAOx3 Cardiovascular: S1S2/RRR Respiratory: CTAB  Discharge Instructions   Discharge Instructions    Diet general    Complete by:  As directed      Increase activity slowly    Complete by:  As directed           Current Discharge Medication List    START taking these medications   Details  cefUROXime (CEFTIN) 500 MG tablet Take 1 tablet (500 mg total) by mouth 2 (two) times daily with a meal. For 4days Qty: 8 tablet, Refills: 0    guaiFENesin (MUCINEX) 600 MG 12 hr tablet Take 1 tablet (600 mg total) by mouth 2 (two) times daily. For 1 week      CONTINUE these medications which have CHANGED   Details  potassium chloride SA (K-DUR,KLOR-CON) 20 MEQ tablet Take 2 tablets (40 mEq total) by mouth daily.      CONTINUE  these medications which have NOT CHANGED   Details  antiseptic oral rinse (BIOTENE) LIQD 15 mLs by Mouth Rinse route daily as needed for dry mouth.     baclofen (LIORESAL) 10 MG tablet Take 10 mg by mouth 4 (four) times daily.     diphenhydrAMINE (BENADRYL) 25 MG tablet Take 25 mg by mouth every 4 (four) hours as needed for itching.    ENSURE PLUS (ENSURE PLUS) LIQD Take 237 mLs by mouth 2 (two) times daily between meals.    guaiFENesin-dextromethorphan (ROBITUSSIN DM) 100-10 MG/5ML syrup Take 5  mLs by mouth every 4 (four) hours as needed for cough. Qty: 118 mL, Refills: 0    ipratropium-albuterol (DUONEB) 0.5-2.5 (3) MG/3ML SOLN Take 3 mLs by nebulization 3 (three) times daily. Qty: 360 mL, Refills: 0    LORazepam (ATIVAN) 0.5 MG tablet Take 1 tablet (0.5 mg total) by mouth every 6 (six) hours as needed for anxiety. Qty: 10 tablet, Refills: 0    OXYCODONE HCL PO Take 30 mg by mouth every 4 (four) hours. pain    polyethylene glycol (MIRALAX / GLYCOLAX) packet Take 17 g by mouth daily.     pregabalin (LYRICA) 100 MG capsule Take 100 mg by mouth 2 (two) times daily.    rOPINIRole (REQUIP) 0.5 MG tablet Take 0.5 mg by mouth at bedtime.    simethicone (MYLICON) 125 MG chewable tablet Chew 125 mg by mouth 4 (four) times daily - after meals and at bedtime.       STOP taking these medications     loratadine-pseudoephedrine (CLARITIN-D 12-HOUR) 5-120 MG tablet      oxybutynin (DITROPAN) 5 MG tablet      potassium chloride (K-DUR) 10 MEQ tablet      acetaminophen (TYLENOL) 325 MG tablet      ondansetron (ZOFRAN) 4 MG tablet        Allergies  Allergen Reactions  . Levaquin [Levofloxacin In D5w] Other (See Comments)    PER MEDICATION MAR  . Penicillins Other (See Comments)    PER MEDICATION MAR  . Vancomycin Rash    PER MEDICATION MAR   Follow-up Information    Follow up with AMIN, [name], MD. Schedule an appointment as soon as possible for a visit in 1 week.   Specialty:  Internal Medicine   Contact information:   [address] [place] [address] [phone]        The results of significant diagnostics from this hospitalization (including imaging, microbiology, ancillary and laboratory) are listed below for reference.    Significant Diagnostic Studies: Dg Chest 2 View  12/03/2015  CLINICAL DATA:  Per patient he has been coughing up blood, left sided abdominal and chest pain. Patient states he thinks he may having a sinus infection because he has been  having sinus problems for about 2 week. EXAM: CHEST  2 VIEW COMPARISON:  11/30/2015 FINDINGS: Lower cervical spine fixation. Patient rotated minimally left. Midline trachea. Mild cardiomegaly. No pleural effusion or pneumothorax. Clear lungs. IMPRESSION: Hyperinflation, without acute disease. Cardiomegaly without congestive failure. Electronically Signed   By: Jeronimo Greaves M.D.   On: 12/03/2015 10:39   Dg Chest 2 View  11/30/2015  CLINICAL DATA:  Chest pain EXAM: CHEST  2 VIEW COMPARISON:  August 01, 2015 FINDINGS: There is slight atelectasis in the left base. Lungs elsewhere are clear. Heart size and pulmonary vascularity are normal. No adenopathy. There is postoperative change in the lower cervical spine region. There is mid thoracic  dextroscoliosis. IMPRESSION: Mild atelectasis left base. No edema or consolidation. Stable cardiac silhouette. Electronically Signed   By: Bretta Bang III M.D.   On: 11/30/2015 10:25   Dg Abd 1 View  12/12/2015  CLINICAL DATA:  Constipation. EXAM: ABDOMEN - 1 VIEW COMPARISON:  KUB July 24, 2015 and CT scan November 20, 2015 FINDINGS: There is a very large stool ball within the rectum measuring at least 27 x 19 cm. This distends the rectum and is worsened when compared to the recent CT scan. Air-filled prominent loops of colon are seen throughout. No small bowel dilatation. No free air, portal venous gas, or pneumatosis identified. IMPRESSION: Very large stool ball in the rectum, larger since November 20, 2015, consistent with fecal impaction. The patient is at risk for stercoral colitis. This stool ball is likely resulting in mild relative partial obstruction of the colon with air-filled prominent loops of colon throughout. Electronically Signed   By: Gerome Sam III M.D   On: 12/12/2015 01:39   Dg Chest Portable 1 View  12/11/2015  CLINICAL DATA:  55 year old paraplegic with approximate 4-5 day history of productive cough and shortness of breath. EXAM: PORTABLE CHEST 1  VIEW COMPARISON:  12/03/2015 and earlier. FINDINGS: Cardiac silhouette normal in size, unchanged. Dense airspace consolidation involving the right lower lobe silhouetting the right hemidiaphragm. Patchy airspace opacities at the left lung base. Upper lungs remain clear. Pulmonary vascularity normal. No visible pleural effusion. Marked thoracic dextroscoliosis again noted. IMPRESSION: Dense right lower lobe pneumonia and/or atelectasis and patchy pneumonia involving the left lung base. Electronically Signed   By: Hulan Saas M.D.   On: 12/11/2015 21:45    Microbiology: Recent Results (from the past 240 hour(s))  Urine culture     Status: Abnormal   Collection Time: 12/11/15  8:40 PM  Result Value Ref Range Status   Specimen Description URINE, CATHETERIZED  Final   Special Requests NONE  Final   Culture (A)  Final    >=100,000 COLONIES/mL ESCHERICHIA COLI >=100,000 COLONIES/mL ENTEROCOCCUS SPECIES    Report Status 12/14/2015 FINAL  Final   Organism ID, Bacteria ESCHERICHIA COLI (A)  Final   Organism ID, Bacteria ENTEROCOCCUS SPECIES (A)  Final      Susceptibility   Escherichia coli - MIC*    AMPICILLIN >=32 RESISTANT Resistant     CEFAZOLIN <=4 SENSITIVE Sensitive     CEFTRIAXONE <=1 SENSITIVE Sensitive     CIPROFLOXACIN >=4 RESISTANT Resistant     GENTAMICIN <=1 SENSITIVE Sensitive     IMIPENEM <=0.25 SENSITIVE Sensitive     NITROFURANTOIN <=16 SENSITIVE Sensitive     TRIMETH/SULFA >=320 RESISTANT Resistant     AMPICILLIN/SULBACTAM 8 SENSITIVE Sensitive     PIP/TAZO 8 SENSITIVE Sensitive     * >=100,000 COLONIES/mL ESCHERICHIA COLI   Enterococcus species - MIC*    AMPICILLIN <=2 SENSITIVE Sensitive     LEVOFLOXACIN >=8 RESISTANT Resistant     NITROFURANTOIN <=16 SENSITIVE Sensitive     VANCOMYCIN 1 SENSITIVE Sensitive     * >=100,000 COLONIES/mL ENTEROCOCCUS SPECIES  Culture, blood (Routine x 2)     Status: Abnormal (Preliminary result)   Collection Time: 12/11/15  9:06 PM   Result Value Ref Range Status   Specimen Description BLOOD RIGHT HAND  Final   Special Requests BOTTLES DRAWN AEROBIC AND ANAEROBIC  Final   Culture  Setup Time   Final    GRAM POSITIVE COCCI IN CLUSTERS ANAEROBIC BOTTLE ONLY Organism ID to follow CRITICAL RESULT  CALLED TO, READ BACK BY AND VERIFIED WITH: K. HIATT,PHARM AT 0802 ON [id number] BY Lucienne CapersS. YARBROUGH    Culture (A)  Final    STAPHYLOCOCCUS SPECIES (COAGULASE NEGATIVE) THE SIGNIFICANCE OF ISOLATING THIS ORGANISM FROM A SINGLE SET OF BLOOD CULTURES WHEN MULTIPLE SETS ARE DRAWN IS UNCERTAIN. PLEASE NOTIFY THE MICROBIOLOGY DEPARTMENT WITHIN ONE WEEK IF SPECIATION AND SENSITIVITIES ARE REQUIRED.    Report Status PENDING  Incomplete  Blood Culture ID Panel (Reflexed)     Status: None   Collection Time: 12/11/15  9:06 PM  Result Value Ref Range Status   Enterococcus species NOT DETECTED NOT DETECTED Final   Vancomycin resistance NOT DETECTED NOT DETECTED Final   Listeria monocytogenes NOT DETECTED NOT DETECTED Final   Staphylococcus species NOT DETECTED NOT DETECTED Final   Staphylococcus aureus NOT DETECTED NOT DETECTED Final   Methicillin resistance NOT DETECTED NOT DETECTED Final   Streptococcus species NOT DETECTED NOT DETECTED Final   Streptococcus agalactiae NOT DETECTED NOT DETECTED Final   Streptococcus pneumoniae NOT DETECTED NOT DETECTED Final   Streptococcus pyogenes NOT DETECTED NOT DETECTED Final   Acinetobacter baumannii NOT DETECTED NOT DETECTED Final   Enterobacteriaceae species NOT DETECTED NOT DETECTED Final   Enterobacter cloacae complex NOT DETECTED NOT DETECTED Final   Escherichia coli NOT DETECTED NOT DETECTED Final   Klebsiella oxytoca NOT DETECTED NOT DETECTED Final   Klebsiella pneumoniae NOT DETECTED NOT DETECTED Final   Proteus species NOT DETECTED NOT DETECTED Final   Serratia marcescens NOT DETECTED NOT DETECTED Final   Carbapenem resistance NOT DETECTED NOT DETECTED Final   Haemophilus influenzae  NOT DETECTED NOT DETECTED Final   Neisseria meningitidis NOT DETECTED NOT DETECTED Final   Pseudomonas aeruginosa NOT DETECTED NOT DETECTED Final   Candida albicans NOT DETECTED NOT DETECTED Final   Candida glabrata NOT DETECTED NOT DETECTED Final   Candida krusei NOT DETECTED NOT DETECTED Final   Candida parapsilosis NOT DETECTED NOT DETECTED Final   Candida tropicalis NOT DETECTED NOT DETECTED Final  Culture, blood (Routine x 2)     Status: None (Preliminary result)   Collection Time: 12/11/15  9:09 PM  Result Value Ref Range Status   Specimen Description BLOOD LEFT HAND  Final   Special Requests IN PEDIATRIC BOTTLE 3ML  Final   Culture NO GROWTH 3 DAYS  Final   Report Status PENDING  Incomplete  MRSA PCR Screening     Status: Abnormal   Collection Time: 12/12/15  4:15 AM  Result Value Ref Range Status   MRSA by PCR POSITIVE (A) NEGATIVE Final    Comment: RESULT CALLED TO, READ BACK BY AND VERIFIED WITH: Ronnald NianB. Tillman RN 9:40 12/12/15 ([email or web address])        The GeneXpert MRSA Assay (FDA approved for NASAL specimens only), is one component of a comprehensive MRSA colonization surveillance program. It is not intended to diagnose MRSA infection nor to guide or monitor treatment for MRSA infections.      Labs: Basic Metabolic Panel:  Recent Labs Lab 12/11/15 2107 12/12/15 0155 12/13/15 0538 12/14/15 0808  NA 133* 138 139 140  K 4.0 3.0* 3.8 4.3  CL 107 115* 113* 116*  CO2 18* 16* 17* 18*  GLUCOSE 93 90 101* 97  BUN 14 10 11 12   CREATININE 0.85 0.61 0.66 0.72  CALCIUM 8.4* 6.0* 8.4* 8.3*  MG 1.7  --  1.8  --   PHOS  --   --  2.1*  --    Liver Function Tests:  Recent Labs Lab 12/11/15 2107  AST 17  ALT 13*  ALKPHOS 117  BILITOT 2.1*  PROT 6.8  ALBUMIN 2.6*   No results for input(s): LIPASE, AMYLASE in the last 168 hours. No results for input(s): AMMONIA in the last 168 hours. CBC:  Recent Labs Lab 12/11/15 2107 12/12/15 0155 12/13/15 0538 12/14/15 0808   WBC 5.0 3.2* 5.5 4.9  NEUTROABS 4.1  --   --   --   HGB 10.5* 8.0* 10.0* 9.7*  HCT 32.9* 24.4* 30.2* 29.8*  MCV 78.7 78.2 79.1 80.1  PLT 126* 93* 130* 136*   Cardiac Enzymes: No results for input(s): CKTOTAL, CKMB, CKMBINDEX, TROPONINI in the last 168 hours. BNP: BNP (last 3 results) No results for input(s): BNP in the last 8760 hours.  ProBNP (last 3 results) No results for input(s): PROBNP in the last 8760 hours.  CBG: No results for input(s): GLUCAP in the last 168 hours.     SignedZannie Cove MD.  [facility] 12/14/2015, 4:16 PM

## 2015-12-14 NOTE — [place] FL2 (Signed)
[place] MEDICAID FL2 LEVEL OF CARE SCREENING TOOL     IDENTIFICATION  Patient Name: Alan Warner Birthdate: [birth year] Sex: male Admission Date (Current Location): 12/11/2015  [place]  [place] [id number] R Facility and Address:  [facility], [address], [place], [place] [address]      Provider Number: [id number]  Attending Physician Name and Address:  Zannie CovePreetha Joseph, MD  Relative Name and Phone Number:  Carmelia Carroll-daughter. Phone #[phone]    Current Level of Care: Hospital Recommended Level of Care: Skilled Nursing Facility [facility] Prior Approval Number:    Date Approved/Denied:   PASRR Number: [id number] (Eff. 02/25/12)  Discharge Plan: SNF [facility]    Current Diagnoses: Patient Active Problem List   Diagnosis Date Noted  . Sepsis (HCC) 12/12/2015  . HCAP (healthcare-associated pneumonia) 12/12/2015  . Pressure ulcer 12/12/2015  . Constipation   . Septic shock (HCC) 08/02/2015  . 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 Fecal impaction (HCC) 04/24/2015  . Hydronephrosis, right 04/24/2015  . Hypokalemia 04/24/2015  . Fever with multiple potential sources of infection 04/24/2015  . UTI (lower urinary tract infection) 04/24/2015  . Complicated UTI (urinary tract infection) 04/24/2015  . Cough 10/05/2014  . Paraplegia (HCC) 10/05/2014  . Chronic pain 10/05/2014  . Sacral decubitus ulcer 10/05/2014    Orientation RESPIRATION BLADDER Height & Weight     Self, Time, Situation, Place  Normal Incontinent (Neurogenic bladder and chronic suprapubic catheter) Weight: 146 lb 9.7 oz (66.5 kg) Height:  6' (182.9 cm)  BEHAVIORAL SYMPTOMS/MOOD NEUROLOGICAL BOWEL NUTRITION STATUS      Incontinent Diet (Heart healthy)  AMBULATORY STATUS COMMUNICATION OF NEEDS Skin   Total Care (Patieint has paralysis) Verbally Normal  Stage 3 pressure ulcer to sacrum                     Personal Care  Assistance Level of Assistance  Bathing, Feeding, Dressing Bathing Assistance: Limited assistance Feeding assistance: Independent Dressing Assistance: Limited assistance     Functional Limitations Info  Sight, Hearing, Speech Sight Info: Adequate Hearing Info: Adequate Speech Info: Adequate    SPECIAL CARE FACTORS FREQUENCY                       Contractures Contractures Info: Not present    Additional Factors Info  Code Status, Allergies Code Status Info: Full code Allergies Info: Levaquin, Penicillins, Vancomycin           Current Medications (12/14/2015):  This is the current hospital active medication list Current Facility-Administered Medications  Medication Dose Route Frequency Provider Last Rate Last Dose  . acetaminophen (TYLENOL) tablet 650 mg  650 mg Oral Q4H PRN Hillary BowJared M Gardner, DO      . antiseptic oral rinse (BIOTENE) solution 15 mL  15 mL Mouth Rinse Daily PRN Hillary BowJared M Gardner, DO      . baclofen (LIORESAL) tablet 10 mg  10 mg Oral QID Hillary BowJared M Gardner, DO   10 mg at 12/14/15 1109  . bisacodyl (DULCOLAX) suppository 10 mg  10 mg Rectal Once Zannie CovePreetha Joseph, MD   10 mg at 12/12/15 1310  . cefUROXime (CEFTIN) tablet 500 mg  500 mg Oral BID WC Zannie CovePreetha Joseph, MD   500 mg at 12/14/15 1108  . Chlorhexidine Gluconate Cloth 2 % PADS 6 each  6 each Topical Q0600 Zannie CovePreetha Joseph, MD   6 each at 12/13/15 0600  . diphenhydrAMINE (BENADRYL)  capsule 25 mg  25 mg Oral Q4H PRN Hillary BowJared M Gardner, DO      . feeding supplement (ENSURE ENLIVE) (ENSURE ENLIVE) liquid 237 mL  237 mL Oral TID BM Zannie CovePreetha Joseph, MD   237 mL at 12/14/15 1109  . feeding supplement (PRO-STAT SUGAR FREE 64) liquid 30 mL  30 mL Oral BID Zannie CovePreetha Joseph, MD   30 mL at 12/13/15 2200  . guaiFENesin (MUCINEX) 12 hr tablet 600 mg  600 mg Oral BID Zannie CovePreetha Joseph, MD   600 mg at 12/14/15 1108  . guaiFENesin-dextromethorphan (ROBITUSSIN DM) 100-10 MG/5ML syrup 5 mL  5 mL Oral Q4H PRN Hillary BowJared M Gardner, DO      .  ipratropium-albuterol (DUONEB) 0.5-2.5 (3) MG/3ML nebulizer solution 3 mL  3 mL Nebulization Q4H PRN Hillary BowJared M Gardner, DO      . loratadine (CLARITIN) tablet 10 mg  10 mg Oral Daily Hillary BowJared M Gardner, DO   10 mg at 12/14/15 1108  . LORazepam (ATIVAN) tablet 0.5 mg  0.5 mg Oral Q6H PRN Hillary BowJared M Gardner, DO   0.5 mg at 12/13/15 2357  . milk and molasses enema  1 enema Rectal Once Zannie CovePreetha Joseph, MD   250 mL at 12/12/15 1900  . mupirocin ointment (BACTROBAN) 2 % 1 application  1 application Nasal BID Zannie CovePreetha Joseph, MD   1 application at 12/13/15 2155  . ondansetron (ZOFRAN) tablet 4 mg  4 mg Oral Q6H PRN Hillary BowJared M Gardner, DO      . oxyCODONE (Oxy IR/ROXICODONE) immediate release tablet 30 mg  30 mg Oral Q4H PRN Zannie CovePreetha Joseph, MD   30 mg at 12/14/15 1108  . polyethylene glycol (MIRALAX / GLYCOLAX) packet 17 g  17 g Oral Daily Hillary BowJared M Gardner, DO   17 g at 12/12/15 2207  . potassium chloride (K-DUR) CR tablet 40 mEq  40 mEq Oral BID Zannie CovePreetha Joseph, MD   40 mEq at 12/14/15 1108  . pregabalin (LYRICA) capsule 100 mg  100 mg Oral BID Hillary BowJared M Gardner, DO   100 mg at 12/14/15 1109  . rOPINIRole (REQUIP) tablet 0.5 mg  0.5 mg Oral QHS Hillary BowJared M Gardner, DO   0.5 mg at 12/12/15 2206     Discharge Medications: Please see discharge summary for a list of discharge medications.  Relevant Imaging Results:  Relevant Lab Results:   Additional Information ss#[id number].  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  Cristobal Goldmannrawford, Zaire Vanbuskirk Bradley, LCSW

## 2015-12-14 NOTE — Progress Notes (Signed)
Pt very upset stating he has been waiting since 0600 for something to drink. This RN as well as two other NTs and nurse secretary have been in and out of pt's room since 0700. Pt has been offered fluids and help eating multiple times, but continues to refuse. Pt now requesting to speak to "nurse supervisor" and MD.

## 2015-12-15 LAB — CULTURE, BLOOD (ROUTINE X 2)

## 2015-12-16 LAB — CULTURE, BLOOD (ROUTINE X 2): Culture: NO GROWTH

## 2015-12-22 ENCOUNTER — Other Ambulatory Visit: Payer: Self-pay

## 2015-12-22 ENCOUNTER — Emergency Department (HOSPITAL_COMMUNITY)
Admission: EM | Admit: 2015-12-22 | Discharge: 2015-12-22 | Disposition: A | Discharge status: Home / Self Care | Payer: Medicare Other | Attending: Emergency Medicine | Admitting: Emergency Medicine

## 2015-12-22 ENCOUNTER — Encounter (HOSPITAL_COMMUNITY): Payer: Self-pay | Admitting: Emergency Medicine

## 2015-12-22 ENCOUNTER — Emergency Department (HOSPITAL_COMMUNITY): Payer: Medicare Other

## 2015-12-22 DIAGNOSIS — Z79899 Other long term (current) drug therapy: Secondary | ICD-10-CM | POA: Insufficient documentation

## 2015-12-22 DIAGNOSIS — F17210 Nicotine dependence, cigarettes, uncomplicated: Secondary | ICD-10-CM | POA: Insufficient documentation

## 2015-12-22 DIAGNOSIS — I959 Hypotension, unspecified: Secondary | ICD-10-CM

## 2015-12-22 DIAGNOSIS — I9589 Other hypotension: Secondary | ICD-10-CM | POA: Insufficient documentation

## 2015-12-22 LAB — CBC WITH DIFFERENTIAL/PLATELET
BASOS ABS: 0 10*3/uL (ref 0.0–0.1)
Basophils Relative: 0 %
Eosinophils Absolute: 0.1 10*3/uL (ref 0.0–0.7)
Eosinophils Relative: 2 %
HEMATOCRIT: 34.7 % — AB (ref 39.0–52.0)
Hemoglobin: 11 g/dL — ABNORMAL LOW (ref 13.0–17.0)
LYMPHS ABS: 1.6 10*3/uL (ref 0.7–4.0)
LYMPHS PCT: 22 %
MCH: 25.6 pg — AB (ref 26.0–34.0)
MCHC: 31.7 g/dL (ref 30.0–36.0)
MCV: 80.9 fL (ref 78.0–100.0)
MONO ABS: 0.5 10*3/uL (ref 0.1–1.0)
MONOS PCT: 7 %
NEUTROS ABS: 5.1 10*3/uL (ref 1.7–7.7)
Neutrophils Relative %: 69 %
Platelets: 223 10*3/uL (ref 150–400)
RBC: 4.29 MIL/uL (ref 4.22–5.81)
RDW: 16.8 % — AB (ref 11.5–15.5)
WBC: 7.4 10*3/uL (ref 4.0–10.5)

## 2015-12-22 LAB — COMPREHENSIVE METABOLIC PANEL
ALBUMIN: 2.7 g/dL — AB (ref 3.5–5.0)
ALT: 13 U/L — ABNORMAL LOW (ref 17–63)
AST: 18 U/L (ref 15–41)
Alkaline Phosphatase: 132 U/L — ABNORMAL HIGH (ref 38–126)
Anion gap: 7 (ref 5–15)
BILIRUBIN TOTAL: 0.6 mg/dL (ref 0.3–1.2)
BUN: 6 mg/dL (ref 6–20)
CO2: 23 mmol/L (ref 22–32)
Calcium: 8.7 mg/dL — ABNORMAL LOW (ref 8.9–10.3)
Chloride: 108 mmol/L (ref 101–111)
Creatinine, Ser: 0.77 mg/dL (ref 0.61–1.24)
GFR calc Af Amer: >60 mL/min (ref >60)
GFR calc non Af Amer: >60 mL/min (ref >60)
GLUCOSE: 125 mg/dL — AB (ref 65–99)
POTASSIUM: 4.1 mmol/L (ref 3.5–5.1)
Sodium: 138 mmol/L (ref 135–145)
TOTAL PROTEIN: 7.6 g/dL (ref 6.5–8.1)

## 2015-12-22 LAB — I-STAT CG4 LACTIC ACID, ED: Lactic Acid, Venous: 1.06 mmol/L (ref 0.5–1.9)

## 2015-12-22 MED ORDER — SODIUM CHLORIDE 0.9 % IV BOLUS (SEPSIS)
1000.0000 mL | Freq: Once | INTRAVENOUS | Status: AC
  Administered 2015-12-22: 1000 mL via INTRAVENOUS

## 2015-12-22 MED ORDER — OXYCODONE-ACETAMINOPHEN 5-325 MG PO TABS
2.0000 | ORAL_TABLET | Freq: Once | ORAL | Status: AC
  Administered 2015-12-22: 2 via ORAL
  Filled 2015-12-22: qty 2

## 2015-12-22 NOTE — ED Notes (Signed)
Pt blood pressure low, manual bp obtained, dr. Wilkie AyeHorton notified of the same, at bedside evaluating the pt at this time

## 2015-12-22 NOTE — ED Notes (Signed)
Pt to ED via [facility] with  Reports of Hypotension.  Pt st's he was told that his blood pressure was low at the nursing home and nobody did anything about it.  Pt st's the police was called on him due to him being upset.  Pt c/o "I just feel bad".  Pt also c/o chronic right hip pain.
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
## 2015-12-22 NOTE — Discharge Instructions (Signed)
You were seen today for low blood pressure. These appear isolated and very greatly. Your workup was largely reassuring. You were given fluids and repeat blood pressures are reassuring. No signs or symptoms of infection at this time. He will be discharged back to her living facility.  Hypotension As your heart beats, it forces blood through your arteries. This force is your blood pressure. If your blood pressure is too low for you to go about your normal activities or to support the organs of your body, you have hypotension. Hypotension is also referred to as low blood pressure. When your blood pressure becomes too low, you may not get enough blood to your brain. As a result, you may feel weak, feel lightheaded, or develop a rapid heart rate. In a more severe case, you may faint. CAUSES Various conditions can cause hypotension. These include:  Blood loss.  Dehydration.  Heart or endocrine problems.  Pregnancy.  Severe infection.  Not having a well-balanced diet filled with needed nutrients.  Severe allergic reactions (anaphylaxis). Some medicines, such as blood pressure medicine or water pills (diuretics), may lower your blood pressure below normal. Sometimes taking too much medicine or taking medicine not as directed can cause hypotension. TREATMENT  Hospitalization is sometimes required for hypotension if fluid or blood replacement is needed, if time is needed for medicines to wear off, or if further monitoring is needed. Treatment might include changing your diet, changing your medicines (including medicines aimed at raising your blood pressure), and use of support stockings. HOME CARE INSTRUCTIONS   Drink enough fluids to keep your urine clear or pale yellow.  Take your medicines as directed by your health care provider.  Get up slowly from reclining or sitting positions. This gives your blood pressure a chance to adjust.  Wear support stockings as directed by your health care  provider.  Maintain a healthy diet by including nutritious food, such as fruits, vegetables, nuts, whole grains, and lean meats. SEEK MEDICAL CARE IF:  You have vomiting or diarrhea.  You have a fever for more than 2-3 days.  You feel more thirsty than usual.  You feel weak and tired. SEEK IMMEDIATE MEDICAL CARE IF:   You have chest pain or a fast or irregular heartbeat.  You have a loss of feeling in some part of your body, or you lose movement in your arms or legs.  You have trouble speaking.  You become sweaty or feel lightheaded.  You faint. MAKE SURE YOU:   Understand these instructions.  Will watch your condition.  Will get help right away if you are not doing well or get worse.   This information is not intended to replace advice given to you by your health care provider. Make sure you discuss any questions you have with your health care provider.   Document Released: 06/02/2005 Document Revised: 03/23/2013 Document Reviewed: 12/03/2012 Elsevier Interactive Patient Education [employer]2016 [employer]

## 2015-12-22 NOTE — ED Notes (Signed)
Pt refusing xray, stating that "they were asking me stupid questions" Pt also refusing rectal temp at this time. EDP made aware of all the same.

## 2015-12-24 ENCOUNTER — Observation Stay (HOSPITAL_COMMUNITY): Payer: Medicare Other

## 2015-12-24 ENCOUNTER — Observation Stay (HOSPITAL_COMMUNITY)
Admission: EM | Admit: 2015-12-24 | Discharge: 2015-12-24 | Disposition: A | Discharge status: Home / Self Care | Payer: Medicare Other | Attending: Internal Medicine | Admitting: Internal Medicine

## 2015-12-24 ENCOUNTER — Emergency Department (HOSPITAL_COMMUNITY): Payer: Medicare Other

## 2015-12-24 ENCOUNTER — Encounter (HOSPITAL_COMMUNITY): Payer: Self-pay | Admitting: Emergency Medicine

## 2015-12-24 DIAGNOSIS — F17210 Nicotine dependence, cigarettes, uncomplicated: Secondary | ICD-10-CM | POA: Insufficient documentation

## 2015-12-24 DIAGNOSIS — I959 Hypotension, unspecified: Secondary | ICD-10-CM | POA: Diagnosis present

## 2015-12-24 DIAGNOSIS — N390 Urinary tract infection, site not specified: Secondary | ICD-10-CM | POA: Diagnosis not present

## 2015-12-24 DIAGNOSIS — J189 Pneumonia, unspecified organism: Secondary | ICD-10-CM | POA: Diagnosis not present

## 2015-12-24 DIAGNOSIS — G903 Multi-system degeneration of the autonomic nervous system: Secondary | ICD-10-CM | POA: Diagnosis not present

## 2015-12-24 DIAGNOSIS — D638 Anemia in other chronic diseases classified elsewhere: Secondary | ICD-10-CM | POA: Diagnosis present

## 2015-12-24 DIAGNOSIS — G8220 Paraplegia, unspecified: Secondary | ICD-10-CM | POA: Diagnosis present

## 2015-12-24 DIAGNOSIS — R109 Unspecified abdominal pain: Secondary | ICD-10-CM

## 2015-12-24 DIAGNOSIS — G8929 Other chronic pain: Secondary | ICD-10-CM | POA: Diagnosis not present

## 2015-12-24 DIAGNOSIS — L89159 Pressure ulcer of sacral region, unspecified stage: Secondary | ICD-10-CM | POA: Diagnosis present

## 2015-12-24 LAB — COMPREHENSIVE METABOLIC PANEL
ALBUMIN: 2.5 g/dL — AB (ref 3.5–5.0)
ALT: 12 U/L — ABNORMAL LOW (ref 17–63)
ANION GAP: 4 — AB (ref 5–15)
AST: 22 U/L (ref 15–41)
Alkaline Phosphatase: 104 U/L (ref 38–126)
BUN: <5 mg/dL — ABNORMAL LOW (ref 6–20)
CALCIUM: 7.9 mg/dL — AB (ref 8.9–10.3)
CO2: 22 mmol/L (ref 22–32)
Chloride: 112 mmol/L — ABNORMAL HIGH (ref 101–111)
Creatinine, Ser: 0.65 mg/dL (ref 0.61–1.24)
GFR calc Af Amer: >60 mL/min (ref >60)
GFR calc non Af Amer: >60 mL/min (ref >60)
GLUCOSE: 87 mg/dL (ref 65–99)
Potassium: 3.8 mmol/L (ref 3.5–5.1)
SODIUM: 138 mmol/L (ref 135–145)
Total Bilirubin: 0.8 mg/dL (ref 0.3–1.2)
Total Protein: 6.3 g/dL — ABNORMAL LOW (ref 6.5–8.1)

## 2015-12-24 LAB — URINALYSIS, ROUTINE W REFLEX MICROSCOPIC
Bilirubin Urine: NEGATIVE
GLUCOSE, UA: NEGATIVE mg/dL
Ketones, ur: NEGATIVE mg/dL
NITRITE: POSITIVE — AB
Protein, ur: NEGATIVE mg/dL
Specific Gravity, Urine: 1.006 (ref 1.005–1.030)
pH: 6 (ref 5.0–8.0)

## 2015-12-24 LAB — CBC WITH DIFFERENTIAL/PLATELET
BASOS PCT: 0 %
Basophils Absolute: 0 10*3/uL (ref 0.0–0.1)
EOS ABS: 0.1 10*3/uL (ref 0.0–0.7)
Eosinophils Relative: 2 %
HCT: 31.4 % — ABNORMAL LOW (ref 39.0–52.0)
HEMOGLOBIN: 10 g/dL — AB (ref 13.0–17.0)
Lymphocytes Relative: 29 %
Lymphs Abs: 1.5 10*3/uL (ref 0.7–4.0)
MCH: 25.8 pg — ABNORMAL LOW (ref 26.0–34.0)
MCHC: 31.8 g/dL (ref 30.0–36.0)
MCV: 81.1 fL (ref 78.0–100.0)
MONOS PCT: 9 %
Monocytes Absolute: 0.5 10*3/uL (ref 0.1–1.0)
NEUTROS PCT: 60 %
Neutro Abs: 3.1 10*3/uL (ref 1.7–7.7)
Platelets: 192 10*3/uL (ref 150–400)
RBC: 3.87 MIL/uL — AB (ref 4.22–5.81)
RDW: 17.3 % — ABNORMAL HIGH (ref 11.5–15.5)
WBC: 5.2 10*3/uL (ref 4.0–10.5)

## 2015-12-24 LAB — I-STAT CG4 LACTIC ACID, ED
Lactic Acid, Venous: 0.72 mmol/L (ref 0.5–1.9)
Lactic Acid, Venous: 0.63 mmol/L (ref 0.5–1.9)

## 2015-12-24 LAB — URINE MICROSCOPIC-ADD ON

## 2015-12-24 LAB — I-STAT TROPONIN, ED: Troponin i, poc: 0 ng/mL (ref 0.00–0.08)

## 2015-12-24 MED ORDER — ENSURE ENLIVE PO LIQD
237.0000 mL | Freq: Two times a day (BID) | ORAL | Status: DC
Start: 2015-12-24 — End: 2015-12-24

## 2015-12-24 MED ORDER — ACETAMINOPHEN 325 MG PO TABS: 650.0000 mg | ORAL_TABLET | Freq: Four times a day (QID) | ORAL | Status: DC | PRN

## 2015-12-24 MED ORDER — FENTANYL CITRATE (PF) 100 MCG/2ML IJ SOLN
50.0000 ug | Freq: Once | INTRAMUSCULAR | Status: AC
  Administered 2015-12-24: 50 ug via INTRAVENOUS
  Filled 2015-12-24: qty 2

## 2015-12-24 MED ORDER — CEFUROXIME AXETIL 500 MG PO TABS
500.0000 mg | ORAL_TABLET | Freq: Two times a day (BID) | ORAL | Status: DC
  Administered 2015-12-24: 500 mg via ORAL
  Filled 2015-12-24: qty 1

## 2015-12-24 MED ORDER — ONDANSETRON HCL 4 MG PO TABS: 4.0000 mg | ORAL_TABLET | Freq: Four times a day (QID) | ORAL | Status: DC | PRN

## 2015-12-24 MED ORDER — OXYCODONE HCL 5 MG PO TABS
30.0000 mg | ORAL_TABLET | ORAL | Status: DC
  Administered 2015-12-24 (×2): 30 mg via ORAL
  Filled 2015-12-24 (×2): qty 6

## 2015-12-24 MED ORDER — LINACLOTIDE 145 MCG PO CAPS
145.0000 ug | ORAL_CAPSULE | Freq: Every day | ORAL | Status: DC | PRN
  Filled 2015-12-24: qty 1

## 2015-12-24 MED ORDER — SODIUM CHLORIDE 0.9 % IV BOLUS (SEPSIS)
1000.0000 mL | Freq: Once | INTRAVENOUS | Status: AC
  Administered 2015-12-24: 1000 mL via INTRAVENOUS

## 2015-12-24 MED ORDER — SIMETHICONE 80 MG PO CHEW
80.0000 mg | CHEWABLE_TABLET | Freq: Four times a day (QID) | ORAL | Status: DC
  Administered 2015-12-24: 80 mg via ORAL
  Filled 2015-12-24 (×2): qty 1

## 2015-12-24 MED ORDER — IPRATROPIUM-ALBUTEROL 0.5-2.5 (3) MG/3ML IN SOLN
3.0000 mL | Freq: Three times a day (TID) | RESPIRATORY_TRACT | Status: DC
Start: 2015-12-24 — End: 2015-12-24
  Administered 2015-12-24: 3 mL via RESPIRATORY_TRACT
  Filled 2015-12-24: qty 3

## 2015-12-24 MED ORDER — BISACODYL 10 MG RE SUPP: 10.0000 mg | Freq: Every day | RECTAL | Status: DC | PRN

## 2015-12-24 MED ORDER — PANTOPRAZOLE SODIUM 40 MG PO TBEC: 40.0000 mg | DELAYED_RELEASE_TABLET | Freq: Every day | ORAL | Status: AC

## 2015-12-24 MED ORDER — BACLOFEN 10 MG PO TABS
10.0000 mg | ORAL_TABLET | Freq: Four times a day (QID) | ORAL | Status: DC
  Administered 2015-12-24 (×2): 10 mg via ORAL
  Filled 2015-12-24 (×2): qty 1

## 2015-12-24 MED ORDER — ROPINIROLE HCL 1 MG PO TABS: 0.5000 mg | ORAL_TABLET | Freq: Every day | ORAL | Status: DC

## 2015-12-24 MED ORDER — CEFUROXIME AXETIL 500 MG PO TABS: 500.0000 mg | ORAL_TABLET | Freq: Two times a day (BID) | ORAL | Status: DC

## 2015-12-24 MED ORDER — ALBUTEROL SULFATE (2.5 MG/3ML) 0.083% IN NEBU: 2.5000 mg | INHALATION_SOLUTION | RESPIRATORY_TRACT | Status: DC | PRN

## 2015-12-24 MED ORDER — ONDANSETRON HCL 4 MG/2ML IJ SOLN: 4.0000 mg | Freq: Four times a day (QID) | INTRAMUSCULAR | Status: DC | PRN

## 2015-12-24 MED ORDER — PREGABALIN 100 MG PO CAPS
100.0000 mg | ORAL_CAPSULE | Freq: Two times a day (BID) | ORAL | Status: DC
  Administered 2015-12-24: 100 mg via ORAL
  Filled 2015-12-24 (×2): qty 1

## 2015-12-24 MED ORDER — PANTOPRAZOLE SODIUM 40 MG PO TBEC
40.0000 mg | DELAYED_RELEASE_TABLET | Freq: Every day | ORAL | Status: DC
  Administered 2015-12-24: 40 mg via ORAL
  Filled 2015-12-24: qty 1

## 2015-12-24 MED ORDER — SODIUM CHLORIDE 0.9 % IV SOLN
INTRAVENOUS | Status: AC
  Administered 2015-12-24: 10:00:00 via INTRAVENOUS

## 2015-12-24 MED ORDER — ACETAMINOPHEN 650 MG RE SUPP: 650.0000 mg | Freq: Four times a day (QID) | RECTAL | Status: DC | PRN

## 2015-12-24 MED ORDER — POLYETHYLENE GLYCOL 3350 17 G PO PACK
17.0000 g | PACK | Freq: Two times a day (BID) | ORAL | Status: DC
  Filled 2015-12-24: qty 1

## 2015-12-24 MED ORDER — POTASSIUM CHLORIDE CRYS ER 20 MEQ PO TBCR
20.0000 meq | EXTENDED_RELEASE_TABLET | Freq: Two times a day (BID) | ORAL | Status: DC
  Administered 2015-12-24: 20 meq via ORAL
  Filled 2015-12-24: qty 1

## 2015-12-24 MED ORDER — TIOTROPIUM BROMIDE MONOHYDRATE 18 MCG IN CAPS: 18.0000 ug | ORAL_CAPSULE | Freq: Every day | RESPIRATORY_TRACT | Status: DC

## 2015-12-24 NOTE — Progress Notes (Signed)
Attempted to get report.  ED RN in with another patient.  She will return call.  Room is ready.  [facility] & MinorKimberly Gurnoor Ursua RN-BC, WTA.
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
## 2015-12-24 NOTE — Discharge Instructions (Signed)
Follow with Primary MD Nelson ChimesAMIN, [name], MD in 7 days   Get CBC, CMP,2 view chest X ray checked  by Primary MD next visit.    Activity: As tolerated with Full fall precautions use walker/cane & assistance as needed   Disposition SNF   Diet: Heart Healthy , with feeding assistance and aspiration precautions.  For Heart failure patients - Check your Weight same time everyday, if you gain over 2 pounds, or you develop in leg swelling, experience more shortness of breath or chest pain, call your Primary MD immediately. Follow Cardiac Low Salt Diet and 1.5 lit/day fluid restriction.   On your next visit with your primary care physician please Get Medicines reviewed and adjusted.   Please request your Prim.MD to go over all Hospital Tests and Procedure/Radiological results at the follow up, please get all Hospital records sent to your Prim MD by signing hospital release before you go home.   If you experience worsening of your admission symptoms, develop shortness of breath, life threatening emergency, suicidal or homicidal thoughts you must seek medical attention immediately by calling 911 or calling your MD immediately  if symptoms less severe.  You Must read complete instructions/literature along with all the possible adverse reactions/side effects for all the Medicines you take and that have been prescribed to you. Take any new Medicines after you have completely understood and accpet all the possible adverse reactions/side effects.   Do not drive, operating heavy machinery, perform activities at heights, swimming or participation in water activities or provide baby sitting services if your were admitted for syncope or siezures until you have seen by Primary MD or a Neurologist and advised to do so again.  Do not drive when taking Pain medications.    Do not take more than prescribed Pain, Sleep and Anxiety Medications  Special Instructions: If you have smoked or chewed Tobacco  in the last  2 yrs please stop smoking, stop any regular Alcohol  and or any Recreational drug use.  Wear Seat belts while driving.   Please note  You were cared for by a hospitalist during your hospital stay. If you have any questions about your discharge medications or the care you received while you were in the hospital after you are discharged, you can call the unit and asked to speak with the hospitalist on call if the hospitalist that took care of you is not available. Once you are discharged, your primary care physician will handle any further medical issues. Please note that NO REFILLS for any discharge medications will be authorized once you are discharged, as it is imperative that you return to your primary care physician (or establish a relationship with a primary care physician if you do not have one) for your aftercare needs so that they can reassess your need for medications and monitor your lab values.

## 2015-12-24 NOTE — Care Management Important Message (Signed)
Important Message  Patient Details  Name: Alan Warner MRN: [id number] Date of Birth: 30-Sep-1960   Medicare Important Message Given:  Yes    Margia Wiesen, Annamarie Majorheryl U, RN 12/24/2015, 12:10 PM
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
## 2015-12-24 NOTE — [place] FL2 (Signed)
[place] MEDICAID FL2 LEVEL OF CARE SCREENING TOOL     IDENTIFICATION  Patient Name: Alan LaineJerry W Leaks Birthdate: February 19, 1961 Sex: male Admission Date (Current Location): 12/24/2015  [place]  [place] [id number] Facility and Address:  [facility], [address], [place], [place] [address]      Provider Number: [id number]  Attending Physician Name and Address:  Starleen Armsawood S Elgergawy, MD  Relative Name and Phone Number:  Alan LewandowskyCarmelia Warner - daughter. Phone #[phone]    Current Level of Care: Hospital Recommended Level of Care: Skilled Nursing Facility Prior Approval Number:    Date Approved/Denied:   PASRR Number: [id number] (Eff. 02/25/12)  Discharge Plan: SNF    Current Diagnoses: Patient Active Problem List   Diagnosis Date Noted  . Anemia, chronic disease 12/24/2015  . Hypotension 12/24/2015  . Sepsis (HCC) 12/12/2015  . HCAP (healthcare-associated pneumonia) 12/12/2015  . Pressure ulcer 12/12/2015  . Constipation   . Septic shock (HCC) 08/02/2015  . 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 Fecal impaction (HCC) 04/24/2015  . Hydronephrosis, right 04/24/2015  . Hypokalemia 04/24/2015  . Fever with multiple potential sources of infection 04/24/2015  . UTI (lower urinary tract infection) 04/24/2015  . Complicated UTI (urinary tract infection) 04/24/2015  . Cough 10/05/2014  . Paraplegia (HCC) 10/05/2014  . Chronic pain 10/05/2014  . Sacral decubitus ulcer 10/05/2014    Orientation RESPIRATION BLADDER Height & Weight     Self, Time, Situation, Place  Normal Continent (Neurogenic bladder and chronic suprapubic catheter) Weight: 148 lb (67.132 kg) Height:  5\' 6"  (167.6 cm)  BEHAVIORAL SYMPTOMS/MOOD NEUROLOGICAL BOWEL NUTRITION STATUS      Incontinent Diet (Regular diet)  AMBULATORY STATUS COMMUNICATION OF NEEDS Skin   Total Care (Patient has paralysisand wheelchair bound) Verbally Other (Comment) (Stage 3 pressure Ulcer to  sacrum with foam dressing)                       Personal Care Assistance Level of Assistance  Bathing, Feeding, Dressing Bathing Assistance: Limited assistance Feeding assistance: Independent Dressing Assistance: Limited assistance Total Care Assistance: Maximum assistance   Functional Limitations Info  Sight, Hearing, Speech Sight Info: Adequate Hearing Info: Adequate Speech Info: Adequate    SPECIAL CARE FACTORS FREQUENCY                       Contractures Contractures Info: Not present    Additional Factors Info  Code Status, Isolation Precautions, Allergies Code Status Info: Full Allergies Info: Levaquin, Penicillins, Vancomycin     Isolation Precautions Info: Patient on isolation for MRSA - positive nose culture.     Current Medications (12/24/2015):  This is the current hospital active medication list Current Facility-Administered Medications  Medication Dose Route Frequency Provider Last Rate Last Dose  . 0.9 %  sodium chloride infusion   Intravenous Continuous Starleen Armsawood S Elgergawy, MD 100 mL/hr at 12/24/15 1020    . acetaminophen (TYLENOL) tablet 650 mg  650 mg Oral Q6H PRN Alberteen Samhristopher P Danford, MD       Or  . acetaminophen (TYLENOL) suppository 650 mg  650 mg Rectal Q6H PRN Alberteen Samhristopher P Danford, MD      . albuterol (PROVENTIL) (2.5 MG/3ML) 0.083% nebulizer solution 2.5 mg  2.5 mg Nebulization Q2H PRN Alberteen Samhristopher P Danford, MD      . baclofen (LIORESAL) tablet 10 mg  10 mg Oral QID Alberteen Samhristopher P Danford, MD   10 mg at 12/24/15 0914  .  bisacodyl (DULCOLAX) suppository 10 mg  10 mg Rectal Daily PRN Alberteen Sam, MD      . cefUROXime (CEFTIN) tablet 500 mg  500 mg Oral BID WC Lauren D Bajbus, RPH      . feeding supplement (ENSURE ENLIVE) (ENSURE ENLIVE) liquid 237 mL  237 mL Oral BID BM Alberteen Sam, MD   237 mL at 12/24/15 1000  . ipratropium-albuterol (DUONEB) 0.5-2.5 (3) MG/3ML nebulizer solution 3 mL  3 mL Nebulization TID Alberteen Sam, MD   3 mL at 12/24/15 0941  . linaclotide (LINZESS) capsule 145 mcg  145 mcg Oral Daily PRN Alberteen Sam, MD      . ondansetron (ZOFRAN) tablet 4 mg  4 mg Oral Q6H PRN Alberteen Sam, MD       Or  . ondansetron (ZOFRAN) injection 4 mg  4 mg Intravenous Q6H PRN Alberteen Sam, MD      . oxyCODONE (Oxy IR/ROXICODONE) immediate release tablet 30 mg  30 mg Oral Q4H Alberteen Sam, MD   30 mg at 12/24/15 0912  . pantoprazole (PROTONIX) EC tablet 40 mg  40 mg Oral Daily Starleen Arms, MD   40 mg at 12/24/15 1019  . polyethylene glycol (MIRALAX / GLYCOLAX) packet 17 g  17 g Oral BID Alberteen Sam, MD   17 g at 12/24/15 1000  . potassium chloride SA (K-DUR,KLOR-CON) CR tablet 20 mEq  20 mEq Oral BID Alberteen Sam, MD   20 mEq at 12/24/15 1020  . pregabalin (LYRICA) capsule 100 mg  100 mg Oral BID Alberteen Sam, MD   100 mg at 12/24/15 0913  . rOPINIRole (REQUIP) tablet 0.5 mg  0.5 mg Oral QHS Alberteen Sam, MD      . simethicone (MYLICON) chewable tablet 80 mg  80 mg Oral QID Alberteen Sam, MD   80 mg at 12/24/15 1610     Discharge Medications: Please see discharge summary for a list of discharge medications.  Relevant Imaging Results:  Relevant Lab Results:   Additional Information    Okey Dupre, Lazaro Arms, LCSW

## 2015-12-24 NOTE — Progress Notes (Signed)
Patient refusing full skin assessment, will not allow me to turn or reposition him. Refusing some meds, and vital signs as well. Educated patient on the importance of allowing care and assessment. Patient still refused.

## 2015-12-24 NOTE — Care Management Obs Status (Signed)
MEDICARE OBSERVATION STATUS NOTIFICATION   Patient Details  Name: Alan Warner MRN: [id number] Date of Birth: 11/22/1960   Medicare Observation Status Notification Given:  Yes    Niaomi Cartaya, Annamarie MajorCheryl U, RN 12/24/2015, 12:48 PM

## 2015-12-24 NOTE — ED Notes (Addendum)
Pt [facility] for hypotension; per pt report- BP goes low sometimes; pt also c/o headache with photophobia and nausea since today

## 2015-12-24 NOTE — ED Notes (Signed)
Phleb at bedside to obtain blood cultures. Pt verbalized "Oh my God! I am sick and tired of being stuck with needles! Just leave me alone!" Pt given warm blanket per request
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
## 2015-12-24 NOTE — Progress Notes (Signed)
Pharmacy consulted for antibiotic renal adjustment.  No indications to adjust antibiotics at this time. Scr 0.65- appears to be around patient's baseline. Noted paraplegia, so CrCl unlikely to be accurate.  Plan: -continue cefuroxime PO 500mg  BID -per medication history- this started on 7/5 and was prescribed for 6 days. A stop date of 7/11 has been entered- please adjust if clinically indicated -pharmacy to sign off. Please re-consult if needed  Ceasar Decandia D. Aerith Canal, PharmD, BCPS Clinical Pharmacist Pager: [phone] 12/24/2015 8:08 AM

## 2015-12-24 NOTE — Clinical Social Work Note (Signed)
Clinical Social Work Assessment  Patient Details  Name: Alan LaineJerry W Gowan MRN: [id number] Date of Birth: [birth year]  Date of referral:  12/24/15               Reason for consult:  Facility Placement ([facility])                Permission sought to share information with:  Facility Industrial/product designerContact Representative Permission granted to share information::  Yes, Verbal Permission Granted (Patient made aware that facility contacted)  [facility]  Agency::     Relationship::     Contact Information:     Housing/Transportation Living arrangements for the past 2 months:  Skilled Nursing Facility [facility] Source of Information:  Other (Comment Required) (Patient record) Patient Interpreter Needed:  None Criminal Activity/Legal Involvement Pertinent to Current Situation/Hospitalization:  No - Comment as needed Significant Relationships:  Adult Children Lives with:    Do you feel safe going back to the place where you live?  Yes Need for family participation in patient care:  No (Coment)  Care giving concerns:  None expressed by patient.   Social Worker assessment / plan:  Mr. Royetta Crochetate confirmed [facility] to [facility]. Patient was very angry with the nurse and providing his medications and mainly fussed with CSW regarding the nurse not providing his medications in a timely fashion. CSW expressed understanding of patient feelings.  Employment status:  Disabled (Comment on whether or not currently receiving Disability) Insurance information:  [place], Managed Medicare [facility](UHC Medicare) PT Recommendations:  Not assessed at this time Information / Referral to community resources:  Other (Comment Required) (None needed or requested as patient from a facility)  Patient/Family's Response to care:  Patient not pleased with care from the nurse regarding the administration of his medications.  Patient/Family's Understanding of and Emotional  Response to Diagnosis, Current Treatment, and Prognosis:  Not discussed.  Emotional Assessment Appearance:  Appears older than stated age Attitude/Demeanor/Rapport:  Angry, Complaining (Patient upset about nursing care and demanded to be sent back to facility immediately) Affect (typically observed):  Angry Orientation:  Oriented to Self, Oriented to Place, Oriented to  Time, Oriented to Situation Alcohol / Substance use:  Tobacco Use (Patient reported that he smokes and does not consume alcohol or use illicit drugs.) Psych involvement (Current and /or in the community):  No (Comment)  Discharge Needs  Concerns to be addressed:  Discharge Planning Concerns Readmission within the last 30 days:  Yes Current discharge risk:  None Barriers to Discharge:  No Barriers Identified   Cristobal GoldmannCrawford, Denene Alamillo Bradley, LCSW 12/24/2015, 5:38 PM

## 2015-12-24 NOTE — Consult Note (Addendum)
WOC consult requested for buttocks/sacrum.  [facility], refer to progress notes on 6/28 and 6/29.  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  He refuses to have the location assessed today.  He states, "it is doing fine and I am leaving today.  I don't want you to look at it."  He declines the offer of an air mattress and states he only needs a foam dressing for topical treatment to the affected area.  He has heel lift boots on bilat for pressure reduction.   Please re-consult if further assistance is needed.  Thank-you,  Cammie Mcgeeawn Suzzette Gasparro MSN, RN, CWOCN, [place]AP, CNS [phone]
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
## 2015-12-24 NOTE — Progress Notes (Signed)
[facility] being [facility]. IV removed. Pain meds given to patient. Patient in stable condition leaving floor on a stretcher with Ambulette.
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
## 2015-12-24 NOTE — Clinical Social Work Note (Signed)
[facility] discharged [facility] today, transported by ambulance. Discharge clinicals transmitted to facility and patient's daughter Nestor LewandowskyCarmelia Carroll [phone]) contacted at 6:12 pm and regarding discharge, however CSW could leave a message (VM full).  Genelle BalVanessa Kyleen Villatoro, MSW, LCSW Licensed Clinical Social Worker Clinical Social Work Department [facility] [phone]

## 2015-12-27 LAB — URINE CULTURE

## 2015-12-29 LAB — CULTURE, BLOOD (ROUTINE X 2)
CULTURE: NO GROWTH
CULTURE: NO GROWTH

## 2016-04-25 ENCOUNTER — Emergency Department (HOSPITAL_COMMUNITY): Payer: Medicare Other

## 2016-04-25 ENCOUNTER — Inpatient Hospital Stay (HOSPITAL_COMMUNITY)
Admission: EM | Admit: 2016-04-25 | Discharge: 2016-04-28 | DRG: 698 | Disposition: A | Discharge status: Skilled Nursing Facility | Payer: Medicare Other | Attending: Internal Medicine | Admitting: Internal Medicine

## 2016-04-25 ENCOUNTER — Encounter (HOSPITAL_COMMUNITY): Payer: Self-pay | Admitting: Emergency Medicine

## 2016-04-25 DIAGNOSIS — G9340 Encephalopathy, unspecified: Secondary | ICD-10-CM | POA: Diagnosis present

## 2016-04-25 DIAGNOSIS — N319 Neuromuscular dysfunction of bladder, unspecified: Secondary | ICD-10-CM | POA: Diagnosis present

## 2016-04-25 DIAGNOSIS — M869 Osteomyelitis, unspecified: Secondary | ICD-10-CM | POA: Diagnosis present

## 2016-04-25 DIAGNOSIS — N390 Urinary tract infection, site not specified: Secondary | ICD-10-CM | POA: Diagnosis present

## 2016-04-25 DIAGNOSIS — Z881 Allergy status to other antibiotic agents status: Secondary | ICD-10-CM

## 2016-04-25 DIAGNOSIS — T83518A Infection and inflammatory reaction due to other urinary catheter, initial encounter: Principal | ICD-10-CM | POA: Diagnosis present

## 2016-04-25 DIAGNOSIS — L89159 Pressure ulcer of sacral region, unspecified stage: Secondary | ICD-10-CM | POA: Diagnosis present

## 2016-04-25 DIAGNOSIS — M25559 Pain in unspecified hip: Secondary | ICD-10-CM | POA: Diagnosis present

## 2016-04-25 DIAGNOSIS — L89214 Pressure ulcer of right hip, stage 4: Secondary | ICD-10-CM | POA: Diagnosis present

## 2016-04-25 DIAGNOSIS — I959 Hypotension, unspecified: Secondary | ICD-10-CM | POA: Diagnosis present

## 2016-04-25 DIAGNOSIS — Z7951 Long term (current) use of inhaled steroids: Secondary | ICD-10-CM

## 2016-04-25 DIAGNOSIS — B9620 Unspecified Escherichia coli [E. coli] as the cause of diseases classified elsewhere: Secondary | ICD-10-CM | POA: Diagnosis present

## 2016-04-25 DIAGNOSIS — Z87440 Personal history of urinary (tract) infections: Secondary | ICD-10-CM

## 2016-04-25 DIAGNOSIS — F1120 Opioid dependence, uncomplicated: Secondary | ICD-10-CM | POA: Diagnosis present

## 2016-04-25 DIAGNOSIS — Z79899 Other long term (current) drug therapy: Secondary | ICD-10-CM

## 2016-04-25 DIAGNOSIS — G8220 Paraplegia, unspecified: Secondary | ICD-10-CM | POA: Diagnosis present

## 2016-04-25 DIAGNOSIS — Z960 Presence of urogenital implants: Secondary | ICD-10-CM | POA: Diagnosis present

## 2016-04-25 DIAGNOSIS — R4182 Altered mental status, unspecified: Secondary | ICD-10-CM

## 2016-04-25 DIAGNOSIS — R6520 Severe sepsis without septic shock: Secondary | ICD-10-CM

## 2016-04-25 DIAGNOSIS — B952 Enterococcus as the cause of diseases classified elsewhere: Secondary | ICD-10-CM | POA: Diagnosis present

## 2016-04-25 DIAGNOSIS — Y738 Miscellaneous gastroenterology and urology devices associated with adverse incidents, not elsewhere classified: Secondary | ICD-10-CM | POA: Diagnosis present

## 2016-04-25 DIAGNOSIS — A419 Sepsis, unspecified organism: Secondary | ICD-10-CM | POA: Diagnosis present

## 2016-04-25 DIAGNOSIS — G92 Toxic encephalopathy: Secondary | ICD-10-CM | POA: Diagnosis present

## 2016-04-25 DIAGNOSIS — L89224 Pressure ulcer of left hip, stage 4: Secondary | ICD-10-CM | POA: Diagnosis present

## 2016-04-25 DIAGNOSIS — R739 Hyperglycemia, unspecified: Secondary | ICD-10-CM | POA: Diagnosis present

## 2016-04-25 DIAGNOSIS — Z880 Allergy status to penicillin: Secondary | ICD-10-CM

## 2016-04-25 DIAGNOSIS — B965 Pseudomonas (aeruginosa) (mallei) (pseudomallei) as the cause of diseases classified elsewhere: Secondary | ICD-10-CM | POA: Diagnosis present

## 2016-04-25 DIAGNOSIS — Z8614 Personal history of Methicillin resistant Staphylococcus aureus infection: Secondary | ICD-10-CM

## 2016-04-25 DIAGNOSIS — L89154 Pressure ulcer of sacral region, stage 4: Secondary | ICD-10-CM | POA: Diagnosis present

## 2016-04-25 DIAGNOSIS — D638 Anemia in other chronic diseases classified elsewhere: Secondary | ICD-10-CM | POA: Diagnosis present

## 2016-04-25 DIAGNOSIS — G8929 Other chronic pain: Secondary | ICD-10-CM | POA: Diagnosis present

## 2016-04-25 DIAGNOSIS — Z87891 Personal history of nicotine dependence: Secondary | ICD-10-CM

## 2016-04-25 LAB — COMPREHENSIVE METABOLIC PANEL
ALBUMIN: 2.9 g/dL — AB (ref 3.5–5.0)
ALT: 30 U/L (ref 17–63)
ANION GAP: 7 (ref 5–15)
AST: 56 U/L — ABNORMAL HIGH (ref 15–41)
Alkaline Phosphatase: 276 U/L — ABNORMAL HIGH (ref 38–126)
BILIRUBIN TOTAL: 0.5 mg/dL (ref 0.3–1.2)
BUN: 12 mg/dL (ref 6–20)
CHLORIDE: 108 mmol/L (ref 101–111)
CO2: 22 mmol/L (ref 22–32)
Calcium: 8.5 mg/dL — ABNORMAL LOW (ref 8.9–10.3)
Creatinine, Ser: 0.9 mg/dL (ref 0.61–1.24)
GFR calc Af Amer: >60 mL/min (ref >60)
GFR calc non Af Amer: >60 mL/min (ref >60)
GLUCOSE: 90 mg/dL (ref 65–99)
POTASSIUM: 3.9 mmol/L (ref 3.5–5.1)
Sodium: 137 mmol/L (ref 135–145)
TOTAL PROTEIN: 6.9 g/dL (ref 6.5–8.1)

## 2016-04-25 LAB — CBC WITH DIFFERENTIAL/PLATELET
BASOS ABS: 0 10*3/uL (ref 0.0–0.1)
Basophils Relative: 0 %
Eosinophils Absolute: 0 10*3/uL (ref 0.0–0.7)
Eosinophils Relative: 0 %
HEMATOCRIT: 28.2 % — AB (ref 39.0–52.0)
Hemoglobin: 9.2 g/dL — ABNORMAL LOW (ref 13.0–17.0)
LYMPHS PCT: 4 %
Lymphs Abs: 0.6 10*3/uL — ABNORMAL LOW (ref 0.7–4.0)
MCH: 24.5 pg — ABNORMAL LOW (ref 26.0–34.0)
MCHC: 32.6 g/dL (ref 30.0–36.0)
MCV: 75.2 fL — AB (ref 78.0–100.0)
MONO ABS: 0.9 10*3/uL (ref 0.1–1.0)
Monocytes Relative: 5 %
NEUTROS ABS: 14.7 10*3/uL — AB (ref 1.7–7.7)
Neutrophils Relative %: 91 %
Platelets: 229 10*3/uL (ref 150–400)
RBC: 3.75 MIL/uL — AB (ref 4.22–5.81)
RDW: 16.6 % — ABNORMAL HIGH (ref 11.5–15.5)
WBC: 16.1 10*3/uL — AB (ref 4.0–10.5)

## 2016-04-25 LAB — I-STAT CG4 LACTIC ACID, ED: LACTIC ACID, VENOUS: 0.9 mmol/L (ref 0.5–1.9)

## 2016-04-25 MED ORDER — SODIUM CHLORIDE 0.9 % IV BOLUS (SEPSIS)
250.0000 mL | Freq: Once | INTRAVENOUS | Status: AC
  Administered 2016-04-26: 250 mL via INTRAVENOUS

## 2016-04-25 MED ORDER — SODIUM CHLORIDE 0.9 % IV BOLUS (SEPSIS)
1000.0000 mL | Freq: Once | INTRAVENOUS | Status: AC
  Administered 2016-04-26: 1000 mL via INTRAVENOUS

## 2016-04-25 MED ORDER — SODIUM CHLORIDE 0.9 % IV BOLUS (SEPSIS)
1000.0000 mL | Freq: Once | INTRAVENOUS | Status: AC
  Administered 2016-04-25: 1000 mL via INTRAVENOUS

## 2016-04-25 NOTE — ED Notes (Signed)
EKG given to Knapp,J., for review.

## 2016-04-25 NOTE — ED Notes (Signed)
Skin assessment done w provider in room

## 2016-04-25 NOTE — ED Notes (Signed)
Pt in CT.

## 2016-04-25 NOTE — ED Notes (Signed)
Bed: WA07 Expected date:  Expected time:  Means of arrival:  Comments: Fever nursing home

## 2016-04-25 NOTE — ED Triage Notes (Signed)
Pt comes to ed via ems, c/o family states pt is outside his normal behavior, facility reports temp 103, oral, lethargy and tiredness.  No NVD. Cath in place, on visual  assessment supra public cath looks to be possible UTI. Paraplegic, verbal but sleepy. \ Medical hx of hypotension, chronic pain.  V/s on arrival 110/68, pulse 100, rr 24, 98 room air, cbg 107. Allergic to vancomycin  Family on the way ( from [facility]

## 2016-04-26 ENCOUNTER — Encounter (HOSPITAL_COMMUNITY): Payer: Self-pay | Admitting: Family Medicine

## 2016-04-26 DIAGNOSIS — D638 Anemia in other chronic diseases classified elsewhere: Secondary | ICD-10-CM

## 2016-04-26 DIAGNOSIS — M25559 Pain in unspecified hip: Secondary | ICD-10-CM | POA: Diagnosis present

## 2016-04-26 DIAGNOSIS — R6520 Severe sepsis without septic shock: Secondary | ICD-10-CM

## 2016-04-26 DIAGNOSIS — R739 Hyperglycemia, unspecified: Secondary | ICD-10-CM | POA: Diagnosis not present

## 2016-04-26 DIAGNOSIS — G8929 Other chronic pain: Secondary | ICD-10-CM

## 2016-04-26 DIAGNOSIS — G9340 Encephalopathy, unspecified: Secondary | ICD-10-CM

## 2016-04-26 DIAGNOSIS — Z881 Allergy status to other antibiotic agents status: Secondary | ICD-10-CM | POA: Diagnosis not present

## 2016-04-26 DIAGNOSIS — B9620 Unspecified Escherichia coli [E. coli] as the cause of diseases classified elsewhere: Secondary | ICD-10-CM | POA: Diagnosis present

## 2016-04-26 DIAGNOSIS — M869 Osteomyelitis, unspecified: Secondary | ICD-10-CM | POA: Diagnosis present

## 2016-04-26 DIAGNOSIS — Y738 Miscellaneous gastroenterology and urology devices associated with adverse incidents, not elsewhere classified: Secondary | ICD-10-CM | POA: Diagnosis present

## 2016-04-26 DIAGNOSIS — I959 Hypotension, unspecified: Secondary | ICD-10-CM | POA: Diagnosis present

## 2016-04-26 DIAGNOSIS — Z960 Presence of urogenital implants: Secondary | ICD-10-CM | POA: Diagnosis present

## 2016-04-26 DIAGNOSIS — L89224 Pressure ulcer of left hip, stage 4: Secondary | ICD-10-CM | POA: Diagnosis present

## 2016-04-26 DIAGNOSIS — L89214 Pressure ulcer of right hip, stage 4: Secondary | ICD-10-CM | POA: Diagnosis present

## 2016-04-26 DIAGNOSIS — Z87891 Personal history of nicotine dependence: Secondary | ICD-10-CM | POA: Diagnosis not present

## 2016-04-26 DIAGNOSIS — L89159 Pressure ulcer of sacral region, unspecified stage: Secondary | ICD-10-CM

## 2016-04-26 DIAGNOSIS — A419 Sepsis, unspecified organism: Secondary | ICD-10-CM | POA: Diagnosis not present

## 2016-04-26 DIAGNOSIS — F1120 Opioid dependence, uncomplicated: Secondary | ICD-10-CM | POA: Diagnosis present

## 2016-04-26 DIAGNOSIS — B965 Pseudomonas (aeruginosa) (mallei) (pseudomallei) as the cause of diseases classified elsewhere: Secondary | ICD-10-CM | POA: Diagnosis present

## 2016-04-26 DIAGNOSIS — N319 Neuromuscular dysfunction of bladder, unspecified: Secondary | ICD-10-CM | POA: Diagnosis present

## 2016-04-26 DIAGNOSIS — T83518A Infection and inflammatory reaction due to other urinary catheter, initial encounter: Secondary | ICD-10-CM | POA: Diagnosis present

## 2016-04-26 DIAGNOSIS — N390 Urinary tract infection, site not specified: Secondary | ICD-10-CM | POA: Diagnosis not present

## 2016-04-26 DIAGNOSIS — Z87440 Personal history of urinary (tract) infections: Secondary | ICD-10-CM | POA: Diagnosis not present

## 2016-04-26 DIAGNOSIS — G8220 Paraplegia, unspecified: Secondary | ICD-10-CM | POA: Diagnosis present

## 2016-04-26 DIAGNOSIS — L89154 Pressure ulcer of sacral region, stage 4: Secondary | ICD-10-CM | POA: Diagnosis present

## 2016-04-26 DIAGNOSIS — B952 Enterococcus as the cause of diseases classified elsewhere: Secondary | ICD-10-CM | POA: Diagnosis present

## 2016-04-26 DIAGNOSIS — I9589 Other hypotension: Secondary | ICD-10-CM

## 2016-04-26 DIAGNOSIS — G92 Toxic encephalopathy: Secondary | ICD-10-CM | POA: Diagnosis present

## 2016-04-26 DIAGNOSIS — Z880 Allergy status to penicillin: Secondary | ICD-10-CM | POA: Diagnosis not present

## 2016-04-26 LAB — CBC WITH DIFFERENTIAL/PLATELET
BASOS ABS: 0 10*3/uL (ref 0.0–0.1)
Basophils Relative: 0 %
Eosinophils Absolute: 0 10*3/uL (ref 0.0–0.7)
Eosinophils Relative: 0 %
HEMATOCRIT: 27.1 % — AB (ref 39.0–52.0)
HEMOGLOBIN: 8.6 g/dL — AB (ref 13.0–17.0)
LYMPHS PCT: 9 %
Lymphs Abs: 0.9 10*3/uL (ref 0.7–4.0)
MCH: 24.6 pg — ABNORMAL LOW (ref 26.0–34.0)
MCHC: 31.7 g/dL (ref 30.0–36.0)
MCV: 77.7 fL — AB (ref 78.0–100.0)
MONO ABS: 0.6 10*3/uL (ref 0.1–1.0)
MONOS PCT: 6 %
NEUTROS ABS: 8.7 10*3/uL — AB (ref 1.7–7.7)
NEUTROS PCT: 85 %
Platelets: 179 10*3/uL (ref 150–400)
RBC: 3.49 MIL/uL — ABNORMAL LOW (ref 4.22–5.81)
RDW: 17.1 % — AB (ref 11.5–15.5)
WBC: 10.2 10*3/uL (ref 4.0–10.5)

## 2016-04-26 LAB — URINALYSIS, ROUTINE W REFLEX MICROSCOPIC
Glucose, UA: NEGATIVE mg/dL
Ketones, ur: NEGATIVE mg/dL
NITRITE: POSITIVE — AB
Protein, ur: 100 mg/dL — AB
SPECIFIC GRAVITY, URINE: 1.016 (ref 1.005–1.030)
pH: 8 (ref 5.0–8.0)

## 2016-04-26 LAB — COMPREHENSIVE METABOLIC PANEL
ALK PHOS: 208 U/L — AB (ref 38–126)
ALT: 32 U/L (ref 17–63)
AST: 55 U/L — AB (ref 15–41)
Albumin: 2.4 g/dL — ABNORMAL LOW (ref 3.5–5.0)
Anion gap: 2 — ABNORMAL LOW (ref 5–15)
BILIRUBIN TOTAL: 0.8 mg/dL (ref 0.3–1.2)
BUN: 12 mg/dL (ref 6–20)
CALCIUM: 7.2 mg/dL — AB (ref 8.9–10.3)
CO2: 20 mmol/L — ABNORMAL LOW (ref 22–32)
Chloride: 115 mmol/L — ABNORMAL HIGH (ref 101–111)
Creatinine, Ser: 0.54 mg/dL — ABNORMAL LOW (ref 0.61–1.24)
GFR calc Af Amer: >60 mL/min (ref >60)
GLUCOSE: 111 mg/dL — AB (ref 65–99)
POTASSIUM: 3.6 mmol/L (ref 3.5–5.1)
Sodium: 137 mmol/L (ref 135–145)
TOTAL PROTEIN: 6.2 g/dL — AB (ref 6.5–8.1)

## 2016-04-26 LAB — URINE MICROSCOPIC-ADD ON

## 2016-04-26 LAB — SEDIMENTATION RATE

## 2016-04-26 LAB — PHOSPHORUS: Phosphorus: 2.5 mg/dL (ref 2.5–4.6)

## 2016-04-26 LAB — MAGNESIUM: MAGNESIUM: 1.6 mg/dL — AB (ref 1.7–2.4)

## 2016-04-26 LAB — I-STAT CG4 LACTIC ACID, ED: Lactic Acid, Venous: 0.55 mmol/L (ref 0.5–1.9)

## 2016-04-26 LAB — MRSA PCR SCREENING: MRSA by PCR: NEGATIVE

## 2016-04-26 MED ORDER — SIMETHICONE 80 MG PO CHEW
80.0000 mg | CHEWABLE_TABLET | Freq: Four times a day (QID) | ORAL | Status: DC
  Administered 2016-04-27: 80 mg via ORAL
  Filled 2016-04-26 (×3): qty 1

## 2016-04-26 MED ORDER — SODIUM CHLORIDE 0.9% FLUSH
3.0000 mL | Freq: Two times a day (BID) | INTRAVENOUS | Status: DC
  Administered 2016-04-26 – 2016-04-28 (×3): 3 mL via INTRAVENOUS

## 2016-04-26 MED ORDER — OXYCODONE HCL 5 MG PO TABS
30.0000 mg | ORAL_TABLET | ORAL | Status: DC | PRN
  Administered 2016-04-26 – 2016-04-28 (×11): 30 mg via ORAL
  Filled 2016-04-26 (×11): qty 6

## 2016-04-26 MED ORDER — DEXTROSE 5 % IV SOLN
1.0000 g | Freq: Once | INTRAVENOUS | Status: DC
  Filled 2016-04-26: qty 10

## 2016-04-26 MED ORDER — INSULIN ASPART 100 UNIT/ML ~~LOC~~ SOLN: 0.0000 [IU] | Freq: Every day | SUBCUTANEOUS | Status: DC

## 2016-04-26 MED ORDER — ONDANSETRON HCL 4 MG/2ML IJ SOLN: 4.0000 mg | Freq: Four times a day (QID) | INTRAMUSCULAR | Status: DC | PRN

## 2016-04-26 MED ORDER — SODIUM CHLORIDE 0.9 % IV SOLN
INTRAVENOUS | Status: DC
  Administered 2016-04-26: 06:00:00 via INTRAVENOUS

## 2016-04-26 MED ORDER — LINEZOLID 600 MG/300ML IV SOLN
600.0000 mg | Freq: Two times a day (BID) | INTRAVENOUS | Status: DC
  Administered 2016-04-26 (×2): 600 mg via INTRAVENOUS
  Filled 2016-04-26 (×3): qty 300

## 2016-04-26 MED ORDER — ENSURE ENLIVE PO LIQD
237.0000 mL | Freq: Two times a day (BID) | ORAL | Status: DC
  Administered 2016-04-26 – 2016-04-28 (×4): 237 mL via ORAL

## 2016-04-26 MED ORDER — INSULIN ASPART 100 UNIT/ML ~~LOC~~ SOLN: 0.0000 [IU] | Freq: Three times a day (TID) | SUBCUTANEOUS | Status: DC

## 2016-04-26 MED ORDER — ACETAMINOPHEN 325 MG PO TABS: 650.0000 mg | ORAL_TABLET | Freq: Four times a day (QID) | ORAL | Status: DC | PRN

## 2016-04-26 MED ORDER — LACTATED RINGERS IV BOLUS (SEPSIS)
1000.0000 mL | Freq: Once | INTRAVENOUS | Status: AC
  Administered 2016-04-26: 1000 mL via INTRAVENOUS

## 2016-04-26 MED ORDER — LORAZEPAM 0.5 MG PO TABS
0.5000 mg | ORAL_TABLET | Freq: Four times a day (QID) | ORAL | Status: DC | PRN
  Administered 2016-04-26 – 2016-04-27 (×4): 0.5 mg via ORAL
  Filled 2016-04-26 (×4): qty 1

## 2016-04-26 MED ORDER — OXYBUTYNIN CHLORIDE 5 MG PO TABS
5.0000 mg | ORAL_TABLET | Freq: Three times a day (TID) | ORAL | Status: DC
  Administered 2016-04-26 – 2016-04-28 (×7): 5 mg via ORAL
  Filled 2016-04-26 (×7): qty 1

## 2016-04-26 MED ORDER — BACLOFEN 10 MG PO TABS
10.0000 mg | ORAL_TABLET | Freq: Four times a day (QID) | ORAL | Status: DC
  Administered 2016-04-26 – 2016-04-28 (×9): 10 mg via ORAL
  Filled 2016-04-26 (×9): qty 1

## 2016-04-26 MED ORDER — LIDOCAINE HCL (PF) 1 % IJ SOLN
INTRAMUSCULAR | Status: AC
  Administered 2016-04-26: 1 mL
  Filled 2016-04-26: qty 30

## 2016-04-26 MED ORDER — ONDANSETRON HCL 4 MG PO TABS
4.0000 mg | ORAL_TABLET | Freq: Four times a day (QID) | ORAL | Status: DC | PRN
  Filled 2016-04-26: qty 1

## 2016-04-26 MED ORDER — ADULT MULTIVITAMIN W/MINERALS CH
1.0000 | ORAL_TABLET | Freq: Every day | ORAL | Status: DC
  Administered 2016-04-27: 1 via ORAL
  Filled 2016-04-26 (×2): qty 1

## 2016-04-26 MED ORDER — FENTANYL CITRATE (PF) 100 MCG/2ML IJ SOLN
50.0000 ug | Freq: Once | INTRAMUSCULAR | Status: AC
  Administered 2016-04-26: 50 ug via INTRAVENOUS
  Filled 2016-04-26: qty 2

## 2016-04-26 MED ORDER — POLYETHYLENE GLYCOL 3350 17 G PO PACK: 17.0000 g | PACK | Freq: Two times a day (BID) | ORAL | Status: DC | PRN

## 2016-04-26 MED ORDER — DEXTROSE 5 % IV SOLN
1.0000 g | Freq: Three times a day (TID) | INTRAVENOUS | Status: DC
  Administered 2016-04-26 – 2016-04-27 (×3): 1 g via INTRAVENOUS
  Filled 2016-04-26 (×5): qty 1

## 2016-04-26 MED ORDER — CEFTRIAXONE SODIUM 1 G IJ SOLR
1.0000 g | Freq: Once | INTRAMUSCULAR | Status: AC
  Administered 2016-04-26: 1 g via INTRAMUSCULAR
  Filled 2016-04-26: qty 10

## 2016-04-26 MED ORDER — IPRATROPIUM-ALBUTEROL 0.5-2.5 (3) MG/3ML IN SOLN: 3.0000 mL | RESPIRATORY_TRACT | Status: DC | PRN

## 2016-04-26 MED ORDER — BIOTENE DRY MOUTH MT LIQD: 15.0000 mL | Freq: Every day | OROMUCOSAL | Status: DC | PRN

## 2016-04-26 MED ORDER — DEXTROSE 5 % IV SOLN
2.0000 g | Freq: Once | INTRAVENOUS | Status: AC
  Administered 2016-04-26: 2 g via INTRAVENOUS
  Filled 2016-04-26: qty 2

## 2016-04-26 MED ORDER — PREGABALIN 50 MG PO CAPS
100.0000 mg | ORAL_CAPSULE | Freq: Two times a day (BID) | ORAL | Status: DC
  Administered 2016-04-26 – 2016-04-28 (×5): 100 mg via ORAL
  Filled 2016-04-26 (×5): qty 2

## 2016-04-26 MED ORDER — SODIUM CHLORIDE 0.9 % IV BOLUS (SEPSIS)
1000.0000 mL | Freq: Once | INTRAVENOUS | Status: AC
  Administered 2016-04-26: 1000 mL via INTRAVENOUS

## 2016-04-26 MED ORDER — SODIUM CHLORIDE 0.9 % IV SOLN
INTRAVENOUS | Status: AC
  Administered 2016-04-26 – 2016-04-27 (×3): via INTRAVENOUS

## 2016-04-26 MED ORDER — POTASSIUM CHLORIDE CRYS ER 20 MEQ PO TBCR
20.0000 meq | EXTENDED_RELEASE_TABLET | Freq: Two times a day (BID) | ORAL | Status: DC
  Administered 2016-04-26 – 2016-04-27 (×3): 20 meq via ORAL
  Filled 2016-04-26 (×4): qty 1

## 2016-04-26 MED ORDER — ROPINIROLE HCL 0.5 MG PO TABS
0.5000 mg | ORAL_TABLET | Freq: Every day | ORAL | Status: DC
  Administered 2016-04-26 – 2016-04-27 (×2): 0.5 mg via ORAL
  Filled 2016-04-26 (×2): qty 1

## 2016-04-26 MED ORDER — ACETAMINOPHEN 650 MG RE SUPP: 650.0000 mg | Freq: Four times a day (QID) | RECTAL | Status: DC | PRN

## 2016-04-26 MED ORDER — BISACODYL 10 MG RE SUPP: 10.0000 mg | Freq: Every day | RECTAL | Status: DC | PRN

## 2016-04-26 MED ORDER — ENOXAPARIN SODIUM 40 MG/0.4ML ~~LOC~~ SOLN
40.0000 mg | SUBCUTANEOUS | Status: DC
  Filled 2016-04-26: qty 0.4

## 2016-04-26 MED ORDER — FLUTICASONE PROPIONATE 50 MCG/ACT NA SUSP
1.0000 | Freq: Every day | NASAL | Status: DC
  Filled 2016-04-26: qty 16

## 2016-04-26 MED ORDER — DIPHENHYDRAMINE HCL 25 MG PO CAPS: 25.0000 mg | ORAL_CAPSULE | ORAL | Status: DC | PRN

## 2016-04-26 MED ORDER — POLYVINYL ALCOHOL 1.4 % OP SOLN
1.0000 [drp] | Freq: Four times a day (QID) | OPHTHALMIC | Status: DC | PRN
  Filled 2016-04-26: qty 15

## 2016-04-26 NOTE — ED Notes (Signed)
Per provider order  Maintain nacl fluid till finish  Then start LR at 125 ml ( maintain dose )  Maintain v/s check q 15 mins and report to dr Preston Fleetingglick

## 2016-04-26 NOTE — ED Notes (Signed)
20 min timer started.  

## 2016-04-26 NOTE — Progress Notes (Signed)
Patient admitted to floor from ICU. Refusing initial assessment (physical and vital signs) and even removal of linens from bed transfer by RN upon admission. Very verbally angry and agitated. Telemetry initiated and patient monitored. Melton Alarana A Araly Kaas, RN
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
## 2016-04-26 NOTE — Progress Notes (Signed)
Patient has continued to "pick and choose" which treatments and medications he will receive. Encouraged to change positions in bed but he refuses. Will continue to monitor and encourage medical recommendations. Melton Alarana A Asna Muldrow, RN

## 2016-04-26 NOTE — Progress Notes (Signed)
Wounds were assessed. Cleansed. dressing wet to dry applied. Linen changed. Patient did not tolerate drsg changes well. Pt initially would not allow staff to turn him from side to side to remove soiled linen. Pt also has peg tube. Pt not agreeable to having drsg placed over site. Will continue to provide encouragement.
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
## 2016-04-26 NOTE — ED Provider Notes (Signed)
Patient signed out to me to evaluate response to fluids. He presented in septic shock and there was need to determine whether he would be admitted to stepdown unit or ICU. He had received 2.5 L of fluid in was to get another liter. With that liter of fluid, blood pressure and heart rate were stable. For the last 1.5 hours, blood pressures have been over 110 systolic and he has had a normal heart rate. Case is discussed with Dr. Antionette Charpyd of [facility] hospitalists who agrees to admit the patient.   Dione Boozeavid Jakaiden Fill, MD 04/26/16 [phone]

## 2016-04-26 NOTE — Progress Notes (Signed)
Patient has not been agreeable to wound care. Education provided regarding the importance of drsg changes r/t wounds. Pt stated that he would allow wound assessment at 1 pm.

## 2016-04-26 NOTE — Consult Note (Signed)
WOC Nurse wound consult note Reason for Consult:Patient well known to our team.  Unfortunately, he reports that wounds were seen at 1pm and he refuses to have me or anyone else look at them today.  Further, he indicates he does not wish to have anyone dress his wounds until he is home and states "hopefully tomorrow". He refuses a therapeutic mattress with low air loss feature.  He is refusing turning and repositioning and is in the supine position with the blanket over his head.  His lunch tray is untouched on the window seat in his room.  He reports that [facility] [place] and that Dr. Tomasa BlaseSchultz is his doctor. I understood that Dr. Tomasa BlaseSchultz had retired, but he could quite possibly be working part-time at the [facility] [place].   Wound Treatment Associate (WTA) and two other nurses measured, assessed and dressed the patient's wounds at 1:18pm. I will report their findings below. They noted that patient is contracted and that anatomic descriptions are not classic: Wound type: Chronic, non-healing pressure injuries Pressure Ulcer POA: Yes  Measurements: Left hip:  Unstageable pressure injury measuring 0.7cm x 1.8cm (100% yellow base) Sacral: Stage 4 pressure injury measuring 4.2cm x 4cm x 1.6cm (90% red, 10% yellow base) Left ischial tuberosity:  Stage 4 pressure injury measuring 0.6cm x 1.2cm x 0.5cm (100% red wound bed) Right ischial tuberosity:  Stage 4 pressure injury measuring 11.6cm x 3.2cm x 1cm  (90% red, 10% yellow base)  Wound bed:As described above Drainage (amount, consistency, odor) moderate serous Periwound:intact, dry Dressing procedure/placement/frequency: NS dampened gauze dressings applied and covered with dry gauze.  Secured with tape. Again, patient is not likely to allow a pm dressing change today. Nursing staff will offer and again try to perform wound care.  WOC nursing team will not follow, but will remain  available to this patient, the nursing and medical teams.  Please re-consult if needed. Thanks, Ladona MowLaurie Sulayman Manning, MSN, RN, GNP, Hans EdenCWOCN, CWON-AP, FAAN  Pager# [phone]

## 2016-04-26 NOTE — ED Notes (Signed)
Called pharmacy for  overide med not showing up in pyris maxipime

## 2016-04-26 NOTE — Progress Notes (Signed)
Pharmacy Antibiotic Note  Alan Warner is a 55 y.o. male admitted on 04/25/2016 with UTI.  Pharmacy has been consulted for Cefepime dosing.  Plan: Cefepime 1gm iv q8hr  Weight: 147 lb 11.3 oz (67 kg)  Temp (24hrs), Avg:98.7 F (37.1 C), Min:98.3 F (36.8 C), Max:99.4 F (37.4 C)   Recent Labs Lab 04/25/16 2233 04/25/16 2243 04/26/16 0436  WBC 16.1*  --   --   CREATININE 0.90  --   --   LATICACIDVEN  --  0.90 0.55    Estimated Creatinine Clearance: 85.7 mL/min (by C-G formula based on SCr of 0.9 mg/dL).    Allergies  Allergen Reactions  . Levaquin [Levofloxacin In D5w] Other (See Comments)    PER MEDICATION MAR  . Penicillins Rash and Other (See Comments)    PER MEDICATION MAR  . Vancomycin Rash    PER MEDICATION MAR    Antimicrobials this admission: Ceftriaxone 11/11 x1 Cefepime 11/11 >> Linezolid 11/11 >>  Dose adjustments this admission: -  Microbiology results: pending  Thank you for allowing pharmacy to be a part of this patient's care.  Aleene DavidsonGrimsley Jr, Maly Lemarr Crowford 04/26/2016 5:22 AM

## 2016-04-27 MED ORDER — CEFPODOXIME PROXETIL 200 MG PO TABS
200.0000 mg | ORAL_TABLET | Freq: Two times a day (BID) | ORAL | Status: DC
  Administered 2016-04-27 – 2016-04-28 (×3): 200 mg via ORAL
  Filled 2016-04-27 (×3): qty 1

## 2016-04-27 NOTE — Progress Notes (Addendum)
Patient is agreeable to dressing changings at this time.  Patient's wounds are clean.  Ulcer to the left of his sacrum is bleeding minimally.  Other wounds have scant serous drainage.  Wounds are malodorous.  Patient did not want 'that white stuff that sticks to the wound and the skin' and allowed only ABD pad to be placed on wounds.  Patient also allows split gauze to be placed at site of suprapubic catheter at this time. Dressings are CDI. Will continue to monitor.

## 2016-04-27 NOTE — Progress Notes (Signed)
Patient refusing most care. Encouraged to turn but refuses. Will continue to monitor and follow the plan of care.

## 2016-04-27 NOTE — Progress Notes (Signed)
Patient refusing assessment and wound treatment at this time.  Will attempt again at a later time and continue with what care the patient agrees to currently. Dr. Radonna RickerFeliz is aware.
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
## 2016-04-28 ENCOUNTER — Encounter (HOSPITAL_COMMUNITY): Payer: Self-pay

## 2016-04-28 LAB — HEMOGLOBIN A1C
Hgb A1c MFr Bld: 5 % (ref 4.8–5.6)
MEAN PLASMA GLUCOSE: 97 mg/dL

## 2016-04-28 LAB — URINE CULTURE

## 2016-04-28 MED ORDER — MAGNESIUM SULFATE 2 GM/50ML IV SOLN
2.0000 g | Freq: Once | INTRAVENOUS | Status: AC
  Administered 2016-04-28: 2 g via INTRAVENOUS
  Filled 2016-04-28: qty 50

## 2016-04-28 MED ORDER — CEFPODOXIME PROXETIL 200 MG PO TABS: 200.0000 mg | ORAL_TABLET | Freq: Two times a day (BID) | ORAL | 0 refills | Status: DC

## 2016-04-28 NOTE — Progress Notes (Signed)
[facility] Summer[facility]. Awaiting transport. Melton Alarana A Eldene Plocher, RN
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
## 2016-04-28 NOTE — Progress Notes (Signed)
Pt refuses to turn in bed and allow for dressing changes. Asks for it to be done later. Will pass on in report to day shift nurse.

## 2016-04-28 NOTE — Progress Notes (Signed)
[facility]  CSW following pt. CM signing off.

## 2016-04-28 NOTE — Consult Note (Signed)
SW consult as pt admitted from facility 11/10 and is ready for d/c back to SNF today. [facility] and is able to return today. Pt aware of d/c and reports agreeable. RN to call report and CSW will arrange PTAR for transport. CSW signing off at d/c.   Dellie BurnsJosie Khadeem Rockett, MSW, LCSW [phone] (coverage)

## 2016-04-28 NOTE — Progress Notes (Signed)
Patient agreeable to taking specific meds. Continues to refuse CBG checks. Agreeable to wound care so all areas cleaned with NS, wet-to-dry gauze and ABD pads applied. Melton Alarana A Tamber Burtch, RN

## 2016-04-30 LAB — CULTURE, BLOOD (ROUTINE X 2)
Culture: NO GROWTH
Culture: NO GROWTH

## 2016-05-03 ENCOUNTER — Inpatient Hospital Stay (HOSPITAL_COMMUNITY)
Admission: EM | Admit: 2016-05-03 | Discharge: 2016-05-07 | DRG: 698 | Disposition: A | Discharge status: Skilled Nursing Facility | Payer: Medicare Other | Attending: Internal Medicine | Admitting: Internal Medicine

## 2016-05-03 ENCOUNTER — Encounter (HOSPITAL_COMMUNITY): Payer: Self-pay

## 2016-05-03 ENCOUNTER — Emergency Department (HOSPITAL_COMMUNITY): Payer: Medicare Other

## 2016-05-03 DIAGNOSIS — R000 Tachycardia, unspecified: Secondary | ICD-10-CM | POA: Diagnosis present

## 2016-05-03 DIAGNOSIS — Z880 Allergy status to penicillin: Secondary | ICD-10-CM

## 2016-05-03 DIAGNOSIS — D638 Anemia in other chronic diseases classified elsewhere: Secondary | ICD-10-CM | POA: Diagnosis present

## 2016-05-03 DIAGNOSIS — R0682 Tachypnea, not elsewhere classified: Secondary | ICD-10-CM | POA: Diagnosis present

## 2016-05-03 DIAGNOSIS — T83511A Infection and inflammatory reaction due to indwelling urethral catheter, initial encounter: Principal | ICD-10-CM | POA: Diagnosis present

## 2016-05-03 DIAGNOSIS — G92 Toxic encephalopathy: Secondary | ICD-10-CM | POA: Diagnosis present

## 2016-05-03 DIAGNOSIS — K219 Gastro-esophageal reflux disease without esophagitis: Secondary | ICD-10-CM | POA: Diagnosis present

## 2016-05-03 DIAGNOSIS — F1120 Opioid dependence, uncomplicated: Secondary | ICD-10-CM | POA: Diagnosis present

## 2016-05-03 DIAGNOSIS — R109 Unspecified abdominal pain: Secondary | ICD-10-CM

## 2016-05-03 DIAGNOSIS — K567 Ileus, unspecified: Secondary | ICD-10-CM

## 2016-05-03 DIAGNOSIS — K5909 Other constipation: Secondary | ICD-10-CM | POA: Diagnosis present

## 2016-05-03 DIAGNOSIS — Z8614 Personal history of Methicillin resistant Staphylococcus aureus infection: Secondary | ICD-10-CM | POA: Diagnosis not present

## 2016-05-03 DIAGNOSIS — F17210 Nicotine dependence, cigarettes, uncomplicated: Secondary | ICD-10-CM | POA: Diagnosis present

## 2016-05-03 DIAGNOSIS — A498 Other bacterial infections of unspecified site: Secondary | ICD-10-CM | POA: Diagnosis present

## 2016-05-03 DIAGNOSIS — L89159 Pressure ulcer of sacral region, unspecified stage: Secondary | ICD-10-CM | POA: Diagnosis present

## 2016-05-03 DIAGNOSIS — G8929 Other chronic pain: Secondary | ICD-10-CM | POA: Diagnosis present

## 2016-05-03 DIAGNOSIS — B965 Pseudomonas (aeruginosa) (mallei) (pseudomallei) as the cause of diseases classified elsewhere: Secondary | ICD-10-CM | POA: Diagnosis present

## 2016-05-03 DIAGNOSIS — A419 Sepsis, unspecified organism: Secondary | ICD-10-CM | POA: Diagnosis present

## 2016-05-03 DIAGNOSIS — I950 Idiopathic hypotension: Secondary | ICD-10-CM | POA: Diagnosis not present

## 2016-05-03 DIAGNOSIS — G9340 Encephalopathy, unspecified: Secondary | ICD-10-CM | POA: Diagnosis present

## 2016-05-03 DIAGNOSIS — Y846 Urinary catheterization as the cause of abnormal reaction of the patient, or of later complication, without mention of misadventure at the time of the procedure: Secondary | ICD-10-CM | POA: Diagnosis present

## 2016-05-03 DIAGNOSIS — I959 Hypotension, unspecified: Secondary | ICD-10-CM | POA: Diagnosis present

## 2016-05-03 DIAGNOSIS — G8220 Paraplegia, unspecified: Secondary | ICD-10-CM | POA: Diagnosis present

## 2016-05-03 DIAGNOSIS — N390 Urinary tract infection, site not specified: Secondary | ICD-10-CM

## 2016-05-03 DIAGNOSIS — Z87440 Personal history of urinary (tract) infections: Secondary | ICD-10-CM | POA: Diagnosis not present

## 2016-05-03 DIAGNOSIS — M869 Osteomyelitis, unspecified: Secondary | ICD-10-CM | POA: Diagnosis present

## 2016-05-03 DIAGNOSIS — N319 Neuromuscular dysfunction of bladder, unspecified: Secondary | ICD-10-CM | POA: Diagnosis present

## 2016-05-03 DIAGNOSIS — Z79899 Other long term (current) drug therapy: Secondary | ICD-10-CM

## 2016-05-03 DIAGNOSIS — L89154 Pressure ulcer of sacral region, stage 4: Secondary | ICD-10-CM | POA: Diagnosis present

## 2016-05-03 DIAGNOSIS — R6520 Severe sepsis without septic shock: Secondary | ICD-10-CM | POA: Diagnosis present

## 2016-05-03 DIAGNOSIS — T83511D Infection and inflammatory reaction due to indwelling urethral catheter, subsequent encounter: Secondary | ICD-10-CM

## 2016-05-03 DIAGNOSIS — Z881 Allergy status to other antibiotic agents status: Secondary | ICD-10-CM

## 2016-05-03 DIAGNOSIS — F418 Other specified anxiety disorders: Secondary | ICD-10-CM | POA: Diagnosis present

## 2016-05-03 LAB — CBC WITH DIFFERENTIAL/PLATELET
BASOS ABS: 0 10*3/uL (ref 0.0–0.1)
Basophils Relative: 0 %
EOS PCT: 0 %
Eosinophils Absolute: 0 10*3/uL (ref 0.0–0.7)
HEMATOCRIT: 31.9 % — AB (ref 39.0–52.0)
Hemoglobin: 10.1 g/dL — ABNORMAL LOW (ref 13.0–17.0)
LYMPHS PCT: 11 %
Lymphs Abs: 1.4 10*3/uL (ref 0.7–4.0)
MCH: 24.1 pg — AB (ref 26.0–34.0)
MCHC: 31.7 g/dL (ref 30.0–36.0)
MCV: 76.1 fL — AB (ref 78.0–100.0)
MONO ABS: 1.4 10*3/uL — AB (ref 0.1–1.0)
MONOS PCT: 11 %
Neutro Abs: 9.7 10*3/uL — ABNORMAL HIGH (ref 1.7–7.7)
Neutrophils Relative %: 78 %
PLATELETS: 172 10*3/uL (ref 150–400)
RBC: 4.19 MIL/uL — ABNORMAL LOW (ref 4.22–5.81)
RDW: 17.6 % — AB (ref 11.5–15.5)
WBC: 12.5 10*3/uL — ABNORMAL HIGH (ref 4.0–10.5)

## 2016-05-03 LAB — COMPREHENSIVE METABOLIC PANEL
ALBUMIN: 2.5 g/dL — AB (ref 3.5–5.0)
ALK PHOS: 145 U/L — AB (ref 38–126)
ALT: 12 U/L — ABNORMAL LOW (ref 17–63)
AST: 15 U/L (ref 15–41)
Anion gap: 7 (ref 5–15)
BILIRUBIN TOTAL: 1.2 mg/dL (ref 0.3–1.2)
BUN: 10 mg/dL (ref 6–20)
CO2: 21 mmol/L — AB (ref 22–32)
Calcium: 8.3 mg/dL — ABNORMAL LOW (ref 8.9–10.3)
Chloride: 107 mmol/L (ref 101–111)
Creatinine, Ser: 1 mg/dL (ref 0.61–1.24)
GFR calc Af Amer: >60 mL/min (ref >60)
GFR calc non Af Amer: >60 mL/min (ref >60)
GLUCOSE: 109 mg/dL — AB (ref 65–99)
POTASSIUM: 3.9 mmol/L (ref 3.5–5.1)
SODIUM: 135 mmol/L (ref 135–145)
Total Protein: 7 g/dL (ref 6.5–8.1)

## 2016-05-03 LAB — I-STAT CG4 LACTIC ACID, ED
Lactic Acid, Venous: 1.21 mmol/L (ref 0.5–1.9)
Lactic Acid, Venous: 1.87 mmol/L (ref 0.5–1.9)

## 2016-05-03 MED ORDER — BIOTENE DRY MOUTH MT LIQD: 15.0000 mL | Freq: Every day | OROMUCOSAL | Status: DC | PRN

## 2016-05-03 MED ORDER — SODIUM CHLORIDE 0.9 % IV BOLUS (SEPSIS)
250.0000 mL | Freq: Once | INTRAVENOUS | Status: AC
Start: 2016-05-03 — End: 2016-05-03
  Administered 2016-05-03: 250 mL via INTRAVENOUS

## 2016-05-03 MED ORDER — SODIUM CHLORIDE 0.9 % IV BOLUS (SEPSIS)
1000.0000 mL | Freq: Once | INTRAVENOUS | Status: AC
  Administered 2016-05-03: 1000 mL via INTRAVENOUS

## 2016-05-03 MED ORDER — SIMETHICONE 80 MG PO CHEW
80.0000 mg | CHEWABLE_TABLET | Freq: Four times a day (QID) | ORAL | Status: DC | PRN
  Administered 2016-05-05: 80 mg via ORAL
  Filled 2016-05-03 (×2): qty 1

## 2016-05-03 MED ORDER — PANTOPRAZOLE SODIUM 40 MG PO TBEC
40.0000 mg | DELAYED_RELEASE_TABLET | Freq: Every day | ORAL | Status: DC
  Administered 2016-05-04 – 2016-05-06 (×3): 40 mg via ORAL
  Filled 2016-05-03 (×4): qty 1

## 2016-05-03 MED ORDER — PREGABALIN 25 MG PO CAPS
100.0000 mg | ORAL_CAPSULE | Freq: Two times a day (BID) | ORAL | Status: DC
  Administered 2016-05-04 – 2016-05-07 (×7): 100 mg via ORAL
  Filled 2016-05-03 (×4): qty 4
  Filled 2016-05-03: qty 2
  Filled 2016-05-03 (×3): qty 4

## 2016-05-03 MED ORDER — OXYBUTYNIN CHLORIDE 5 MG PO TABS
5.0000 mg | ORAL_TABLET | Freq: Three times a day (TID) | ORAL | Status: DC
  Administered 2016-05-04 – 2016-05-07 (×11): 5 mg via ORAL
  Filled 2016-05-03 (×13): qty 1

## 2016-05-03 MED ORDER — ROPINIROLE HCL 1 MG PO TABS
0.5000 mg | ORAL_TABLET | Freq: Every day | ORAL | Status: DC
Start: 2016-05-03 — End: 2016-05-07
  Administered 2016-05-04 – 2016-05-06 (×4): 0.5 mg via ORAL
  Filled 2016-05-03 (×4): qty 1

## 2016-05-03 MED ORDER — SODIUM CHLORIDE 0.9 % IV BOLUS (SEPSIS)
1000.0000 mL | Freq: Once | INTRAVENOUS | Status: AC
  Administered 2016-05-04: 1000 mL via INTRAVENOUS

## 2016-05-03 MED ORDER — LORAZEPAM 0.5 MG PO TABS
0.5000 mg | ORAL_TABLET | Freq: Four times a day (QID) | ORAL | Status: DC | PRN
  Administered 2016-05-04 – 2016-05-07 (×4): 0.5 mg via ORAL
  Filled 2016-05-03 (×4): qty 1

## 2016-05-03 MED ORDER — POLYVINYL ALCOHOL 1.4 % OP SOLN: 1.0000 [drp] | Freq: Four times a day (QID) | OPHTHALMIC | Status: DC | PRN

## 2016-05-03 MED ORDER — DIPHENHYDRAMINE HCL 25 MG PO CAPS: 25.0000 mg | ORAL_CAPSULE | ORAL | Status: DC | PRN

## 2016-05-03 MED ORDER — ONDANSETRON HCL 4 MG/2ML IJ SOLN
4.0000 mg | Freq: Three times a day (TID) | INTRAMUSCULAR | Status: DC | PRN
  Administered 2016-05-05 (×2): 4 mg via INTRAVENOUS
  Filled 2016-05-03 (×3): qty 2

## 2016-05-03 MED ORDER — POLYETHYLENE GLYCOL 3350 17 G PO PACK
17.0000 g | PACK | Freq: Two times a day (BID) | ORAL | Status: DC | PRN
  Administered 2016-05-05 – 2016-05-07 (×3): 17 g via ORAL
  Filled 2016-05-03 (×3): qty 1

## 2016-05-03 MED ORDER — BISACODYL 10 MG RE SUPP
10.0000 mg | Freq: Every day | RECTAL | Status: DC | PRN
  Filled 2016-05-03: qty 1

## 2016-05-03 MED ORDER — "AQUACEL-AG FOAM 4""X4"" EX PADS": MEDICATED_PAD | Freq: Every evening | CUTANEOUS | Status: DC

## 2016-05-03 MED ORDER — NICOTINE 21 MG/24HR TD PT24
21.0000 mg | MEDICATED_PATCH | Freq: Every day | TRANSDERMAL | Status: DC
  Filled 2016-05-03 (×3): qty 1

## 2016-05-03 MED ORDER — SODIUM CHLORIDE 0.9 % IV SOLN
INTRAVENOUS | Status: DC
  Administered 2016-05-04 – 2016-05-05 (×4): via INTRAVENOUS

## 2016-05-03 MED ORDER — LINACLOTIDE 145 MCG PO CAPS
145.0000 ug | ORAL_CAPSULE | Freq: Every day | ORAL | Status: DC | PRN
  Filled 2016-05-03: qty 1

## 2016-05-03 MED ORDER — ACETAMINOPHEN 325 MG PO TABS
650.0000 mg | ORAL_TABLET | Freq: Four times a day (QID) | ORAL | Status: DC | PRN
  Administered 2016-05-06: 650 mg via ORAL
  Filled 2016-05-03: qty 2

## 2016-05-03 MED ORDER — BACLOFEN 10 MG PO TABS
10.0000 mg | ORAL_TABLET | Freq: Four times a day (QID) | ORAL | Status: DC
  Administered 2016-05-04 – 2016-05-07 (×16): 10 mg via ORAL
  Filled 2016-05-03: qty 1
  Filled 2016-05-03: qty 2
  Filled 2016-05-03 (×14): qty 1

## 2016-05-03 MED ORDER — ADULT MULTIVITAMIN W/MINERALS CH
1.0000 | ORAL_TABLET | Freq: Every day | ORAL | Status: DC
  Administered 2016-05-04 – 2016-05-06 (×3): 1 via ORAL
  Filled 2016-05-03 (×4): qty 1

## 2016-05-03 MED ORDER — DEXTROSE 5 % IV SOLN
2.0000 g | Freq: Three times a day (TID) | INTRAVENOUS | Status: DC
  Administered 2016-05-03 – 2016-05-07 (×11): 2 g via INTRAVENOUS
  Filled 2016-05-03 (×14): qty 2

## 2016-05-03 MED ORDER — LOPERAMIDE HCL 2 MG PO CAPS: 4.0000 mg | ORAL_CAPSULE | Freq: Four times a day (QID) | ORAL | Status: DC | PRN

## 2016-05-03 MED ORDER — LINEZOLID 600 MG/300ML IV SOLN
600.0000 mg | Freq: Two times a day (BID) | INTRAVENOUS | Status: DC
  Administered 2016-05-04 (×2): 600 mg via INTRAVENOUS
  Filled 2016-05-03 (×4): qty 300

## 2016-05-03 MED ORDER — OXYCODONE HCL 5 MG PO TABS
30.0000 mg | ORAL_TABLET | ORAL | Status: DC | PRN
  Administered 2016-05-04 – 2016-05-06 (×9): 30 mg via ORAL
  Filled 2016-05-03 (×11): qty 6

## 2016-05-03 MED ORDER — ACETAMINOPHEN 650 MG RE SUPP: 650.0000 mg | Freq: Four times a day (QID) | RECTAL | Status: DC | PRN

## 2016-05-03 MED ORDER — SODIUM CHLORIDE 0.9% FLUSH
3.0000 mL | Freq: Two times a day (BID) | INTRAVENOUS | Status: DC
  Administered 2016-05-04: 3 mL via INTRAVENOUS

## 2016-05-03 MED ORDER — ZOLPIDEM TARTRATE 5 MG PO TABS: 5.0000 mg | ORAL_TABLET | Freq: Every evening | ORAL | Status: DC | PRN

## 2016-05-03 MED ORDER — IPRATROPIUM-ALBUTEROL 0.5-2.5 (3) MG/3ML IN SOLN
3.0000 mL | Freq: Three times a day (TID) | RESPIRATORY_TRACT | Status: DC
  Administered 2016-05-04 – 2016-05-07 (×10): 3 mL via RESPIRATORY_TRACT
  Filled 2016-05-03 (×12): qty 3

## 2016-05-03 NOTE — ED Notes (Signed)
Attempted IV start x 2.  No success.  IV team consult ordered.

## 2016-05-03 NOTE — ED Notes (Signed)
2nd RN unsuccessful with US IV attempt

## 2016-05-03 NOTE — Progress Notes (Addendum)
Pharmacy Antibiotic Note  Alan Warner is a 55 y.o. male admitted on 05/03/2016 with UTI.  Pharmacy has been consulted for cefepime dosing.  Patient has history of enterococcus and pseudomonas UTI in August 2017.   Plan: cefepime 2g IV q8h  Follow renal function, c/s, clinical progression, need to change therapy  Height: 6' 4.5" (194.3 cm) Weight: 148 lb (67.1 kg) IBW/kg (Calculated) : 87.95  Temp (24hrs), Avg:102.4 F (39.1 C), Min:102.4 F (39.1 C), Max:102.4 F (39.1 C)  No results for input(s): WBC, CREATININE, LATICACIDVEN, VANCOTROUGH, VANCOPEAK, VANCORANDOM, GENTTROUGH, GENTPEAK, GENTRANDOM, TOBRATROUGH, TOBRAPEAK, TOBRARND, AMIKACINPEAK, AMIKACINTROU, AMIKACIN in the last 168 hours.  Estimated Creatinine Clearance: 101.3 mL/min (by C-G formula based on SCr of 0.54 mg/dL (L)).    Allergies  Allergen Reactions  . Levaquin [Levofloxacin In D5w] Other (See Comments)    PER MEDICATION MAR  . Penicillins Rash and Other (See Comments)    PER MEDICATION MAR  . Vancomycin Rash    PER MEDICATION MAR    Antimicrobials this admission: Cefepime 11/18 >>   Dose adjustments this admission: n/a  Microbiology results: 11/18 BCx:  11/18 UCx:    Thank you for allowing pharmacy to be a part of this patient's care.  Ravonda Brecheen D. Joni Colegrove, PharmD, BCPS Clinical Pharmacist Pager: [phone] 05/03/2016 7:58 PM
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
## 2016-05-03 NOTE — ED Notes (Signed)
EDP Zavitz at bedside to attempt ultrasound IV
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
## 2016-05-03 NOTE — ED Notes (Signed)
IV team at bedside 

## 2016-05-03 NOTE — ED Notes (Signed)
Pt has an indwelling catheter in place

## 2016-05-03 NOTE — ED Notes (Signed)
[facility] contacted to call Code Sepsis 

## 2016-05-03 NOTE — ED Notes (Addendum)
RN Arlys JohnBrian at bedside attempting US IV start

## 2016-05-03 NOTE — ED Triage Notes (Signed)
Per [facility], PT is coming from [facility] with need for PICC Line placement due to UTI with oral antibiotics being unsuccessful. Facility would like to have PICC place to give IV antibiotics. Facility set up for company to have placement yesterday, but the company never came. Pt was rescheduled for tomorrow, but did not want to wait. Pt reports "going in and out of consciousness throughout today." Facility Nurse stated that patient went shopping today and was active around the facility and has been known to manipulate. Vitals per EMS: 106 Palpated, 92 HR, 97% on RA

## 2016-05-04 DIAGNOSIS — A419 Sepsis, unspecified organism: Secondary | ICD-10-CM

## 2016-05-04 DIAGNOSIS — R6520 Severe sepsis without septic shock: Secondary | ICD-10-CM

## 2016-05-04 DIAGNOSIS — I9589 Other hypotension: Secondary | ICD-10-CM

## 2016-05-04 DIAGNOSIS — N390 Urinary tract infection, site not specified: Secondary | ICD-10-CM

## 2016-05-04 DIAGNOSIS — G9340 Encephalopathy, unspecified: Secondary | ICD-10-CM

## 2016-05-04 LAB — URINALYSIS, ROUTINE W REFLEX MICROSCOPIC
Bilirubin Urine: NEGATIVE
Glucose, UA: NEGATIVE mg/dL
Ketones, ur: NEGATIVE mg/dL
Nitrite: POSITIVE — AB
PROTEIN: NEGATIVE mg/dL
SPECIFIC GRAVITY, URINE: 1.005 (ref 1.005–1.030)
pH: 7 (ref 5.0–8.0)

## 2016-05-04 LAB — LACTIC ACID, PLASMA: Lactic Acid, Venous: 0.8 mmol/L (ref 0.5–1.9)

## 2016-05-04 LAB — URINE MICROSCOPIC-ADD ON

## 2016-05-04 LAB — PROCALCITONIN: Procalcitonin: 1.52 ng/mL

## 2016-05-04 LAB — APTT: APTT: 32 s (ref 24–36)

## 2016-05-04 LAB — PROTIME-INR
INR: 1.49
PROTHROMBIN TIME: 18.2 s — AB (ref 11.4–15.2)

## 2016-05-04 LAB — MRSA PCR SCREENING: MRSA by PCR: NEGATIVE

## 2016-05-04 MED ORDER — SODIUM CHLORIDE 0.9% FLUSH: 10.0000 mL | INTRAVENOUS | Status: DC | PRN

## 2016-05-04 MED ORDER — SODIUM CHLORIDE 0.9 % IV BOLUS (SEPSIS)
1000.0000 mL | Freq: Once | INTRAVENOUS | Status: AC
  Administered 2016-05-04: 1000 mL via INTRAVENOUS

## 2016-05-04 MED ORDER — SODIUM CHLORIDE 0.9% FLUSH
10.0000 mL | Freq: Two times a day (BID) | INTRAVENOUS | Status: DC
  Administered 2016-05-04 – 2016-05-05 (×3): 10 mL
  Administered 2016-05-06: 20 mL
  Administered 2016-05-06 – 2016-05-07 (×2): 10 mL

## 2016-05-04 MED ORDER — LINEZOLID 600 MG/300ML IV SOLN
600.0000 mg | Freq: Two times a day (BID) | INTRAVENOUS | Status: DC
  Administered 2016-05-04 – 2016-05-05 (×2): 600 mg via INTRAVENOUS
  Filled 2016-05-04 (×5): qty 300

## 2016-05-04 NOTE — Progress Notes (Addendum)
Spoke with [facility] regarding PICCplacement.  States to be transferred to [facility].  Will wait for tx to place PICC.

## 2016-05-04 NOTE — ED Notes (Signed)
Spoke with IV team and they will insert PICC line when pt is moved to inpt bed.
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
## 2016-05-04 NOTE — Consult Note (Signed)
Agree with linezolid use.   Alan Warner, Lalana Wachter, MD

## 2016-05-04 NOTE — Progress Notes (Signed)
Peripherally Inserted Central Catheter/Midline Placement  The IV Nurse has discussed with the patient and/or persons authorized to consent for the patient, the purpose of this procedure and the potential benefits and risks involved with this procedure.  The benefits include less needle sticks, lab draws from the catheter, and the patient may be discharged home with the catheter. Risks include, but not limited to, infection, bleeding, blood clot (thrombus formation), and puncture of an artery; nerve damage and irregular heartbeat and possibility to perform a PICC exchange if needed/ordered by physician.  Alternatives to this procedure were also discussed.  Bard Power PICC patient education guide, fact sheet on infection prevention and patient information card has been provided to patient /or left at bedside.  Pt physically unable to sign consent due to paraplegia.  Pt gave verbal consent with two witnesses.  PICC/Midline Placement Documentation  PICC Double Lumen 05/04/16 PICC Right Brachial 42 cm 0 cm (Active)  Indication for Insertion or Continuance of Line Prolonged intravenous therapies;Poor Vasculature-patient has had multiple peripheral attempts or PIVs lasting less than 24 hours 05/04/2016 10:14 AM  Exposed Catheter (cm) 0 cm 05/04/2016 10:14 AM  Site Assessment Clean;Dry;Intact 05/04/2016 10:14 AM  Lumen #1 Status Flushed;Saline locked;Blood return noted 05/04/2016 10:14 AM  Lumen #2 Status Flushed;Saline locked;Blood return noted 05/04/2016 10:14 AM  Dressing Type Transparent 05/04/2016 10:14 AM  Dressing Status Clean;Dry;Intact;Antimicrobial disc in place 05/04/2016 10:14 AM  Line Care Connections checked and tightened 05/04/2016 10:14 AM  Line Adjustment (NICU/IV Team Only) No 05/04/2016 10:14 AM  Dressing Intervention New dressing 05/04/2016 10:14 AM  Dressing Change Due 05/11/16 05/04/2016 10:14 AM       Elliot Dallyiggs, Peachie Barkalow Wright 05/04/2016, 10:15 AM

## 2016-05-04 NOTE — ED Notes (Signed)
Report given to 4E RN.  
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
## 2016-05-05 LAB — CBC
HCT: 23.5 % — ABNORMAL LOW (ref 39.0–52.0)
Hemoglobin: 7.3 g/dL — ABNORMAL LOW (ref 13.0–17.0)
MCH: 23.8 pg — AB (ref 26.0–34.0)
MCHC: 31.1 g/dL (ref 30.0–36.0)
MCV: 76.5 fL — AB (ref 78.0–100.0)
PLATELETS: 73 10*3/uL — AB (ref 150–400)
RBC: 3.07 MIL/uL — ABNORMAL LOW (ref 4.22–5.81)
RDW: 17.5 % — AB (ref 11.5–15.5)
WBC: 4.6 10*3/uL (ref 4.0–10.5)

## 2016-05-05 LAB — BASIC METABOLIC PANEL
Anion gap: 6 (ref 5–15)
BUN: 11 mg/dL (ref 6–20)
CHLORIDE: 112 mmol/L — AB (ref 101–111)
CO2: 18 mmol/L — AB (ref 22–32)
CREATININE: 1.03 mg/dL (ref 0.61–1.24)
Calcium: 7.5 mg/dL — ABNORMAL LOW (ref 8.9–10.3)
GFR calc Af Amer: >60 mL/min (ref >60)
GFR calc non Af Amer: >60 mL/min (ref >60)
GLUCOSE: 109 mg/dL — AB (ref 65–99)
Potassium: 3.5 mmol/L (ref 3.5–5.1)
Sodium: 136 mmol/L (ref 135–145)

## 2016-05-05 MED ORDER — SACCHAROMYCES BOULARDII 250 MG PO CAPS
250.0000 mg | ORAL_CAPSULE | Freq: Two times a day (BID) | ORAL | Status: DC
  Administered 2016-05-05 – 2016-05-07 (×4): 250 mg via ORAL
  Filled 2016-05-05 (×4): qty 1

## 2016-05-05 MED ORDER — BISACODYL 5 MG PO TBEC
10.0000 mg | DELAYED_RELEASE_TABLET | Freq: Once | ORAL | Status: AC
  Administered 2016-05-05: 10 mg via ORAL
  Filled 2016-05-05: qty 2

## 2016-05-05 MED ORDER — GI COCKTAIL ~~LOC~~
30.0000 mL | Freq: Three times a day (TID) | ORAL | Status: DC | PRN
  Administered 2016-05-05: 30 mL via ORAL
  Filled 2016-05-05: qty 30

## 2016-05-05 MED ORDER — ENSURE ENLIVE PO LIQD
237.0000 mL | Freq: Two times a day (BID) | ORAL | Status: DC
  Administered 2016-05-05 – 2016-05-07 (×4): 237 mL via ORAL
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
## 2016-05-05 NOTE — Consult Note (Signed)
WOC Nurse wound consult note Reason for Consult:chronic pressure injuries, known to WOC team Wound type: Stage 3 Pressure Injury: left ischial; 0.5cm x 0.5cm x 0.1cm Stage 4 Pressure Injury: right ischial; 9cm x 1.5cm x 0.2cm  Stage 4 Pressure Injury: sacrum; 3.5cm x 2.5cm x 1.0cm  Healed area on the upper left back Pressure Ulcer POA: Yes Measurement: see above Wound bed: Sacrum: clean, pink, bleeds, non granular Left ischium: clean, pink, almost healed Right ischium: pale, non granular, contaminated with stool due to proximity to anus Drainage (amount, consistency, odor) moderate yellow, most at the sacral  Periwound: intact, evidence of healing, periwound on the right ischium and the sacrum are macerated.  Dressing procedure/placement/frequency: All wounds are cleaned thoroughly and the sacral wound is packed with saline moist gauze.  Others are covered with silicone foam only. Change daily. Patient refuses low air loss mattress.    Discussed POC with patient and bedside nurse.  Re consult if needed, will not follow at this time. Thanks  Caidance Sybert M.D.[employer] MSN, RN,CWOCN, CNS [phone])

## 2016-05-06 DIAGNOSIS — I950 Idiopathic hypotension: Secondary | ICD-10-CM

## 2016-05-06 DIAGNOSIS — L89154 Pressure ulcer of sacral region, stage 4: Secondary | ICD-10-CM

## 2016-05-06 DIAGNOSIS — T83511D Infection and inflammatory reaction due to indwelling urethral catheter, subsequent encounter: Secondary | ICD-10-CM

## 2016-05-06 DIAGNOSIS — A498 Other bacterial infections of unspecified site: Secondary | ICD-10-CM | POA: Diagnosis present

## 2016-05-06 DIAGNOSIS — N390 Urinary tract infection, site not specified: Secondary | ICD-10-CM | POA: Diagnosis present

## 2016-05-06 DIAGNOSIS — T83511A Infection and inflammatory reaction due to indwelling urethral catheter, initial encounter: Secondary | ICD-10-CM

## 2016-05-06 DIAGNOSIS — B965 Pseudomonas (aeruginosa) (mallei) (pseudomallei) as the cause of diseases classified elsewhere: Secondary | ICD-10-CM

## 2016-05-06 LAB — CBC
HEMATOCRIT: 24.7 % — AB (ref 39.0–52.0)
Hemoglobin: 7.9 g/dL — ABNORMAL LOW (ref 13.0–17.0)
MCH: 24.2 pg — ABNORMAL LOW (ref 26.0–34.0)
MCHC: 32 g/dL (ref 30.0–36.0)
MCV: 75.8 fL — AB (ref 78.0–100.0)
Platelets: 85 10*3/uL — ABNORMAL LOW (ref 150–400)
RBC: 3.26 MIL/uL — AB (ref 4.22–5.81)
RDW: 17.1 % — AB (ref 11.5–15.5)
WBC: 4.1 10*3/uL (ref 4.0–10.5)

## 2016-05-06 LAB — BASIC METABOLIC PANEL
ANION GAP: 5 (ref 5–15)
BUN: 9 mg/dL (ref 6–20)
CO2: 20 mmol/L — AB (ref 22–32)
Calcium: 8.1 mg/dL — ABNORMAL LOW (ref 8.9–10.3)
Chloride: 115 mmol/L — ABNORMAL HIGH (ref 101–111)
Creatinine, Ser: 0.88 mg/dL (ref 0.61–1.24)
GFR calc Af Amer: >60 mL/min (ref >60)
GFR calc non Af Amer: >60 mL/min (ref >60)
GLUCOSE: 101 mg/dL — AB (ref 65–99)
POTASSIUM: 3.6 mmol/L (ref 3.5–5.1)
Sodium: 140 mmol/L (ref 135–145)

## 2016-05-06 MED ORDER — CALCIUM CARBONATE ANTACID 500 MG PO CHEW
400.0000 mg | CHEWABLE_TABLET | Freq: Three times a day (TID) | ORAL | Status: DC | PRN
  Administered 2016-05-06: 400 mg via ORAL
  Filled 2016-05-06: qty 2

## 2016-05-06 MED ORDER — OXYCODONE HCL 5 MG PO TABS
30.0000 mg | ORAL_TABLET | ORAL | Status: DC | PRN
  Administered 2016-05-07 (×2): 30 mg via ORAL
  Filled 2016-05-06 (×2): qty 6

## 2016-05-06 MED ORDER — PROMETHAZINE HCL 25 MG/ML IJ SOLN
12.5000 mg | Freq: Once | INTRAMUSCULAR | Status: AC
  Administered 2016-05-06: 12.5 mg via INTRAVENOUS
  Filled 2016-05-06: qty 1

## 2016-05-06 NOTE — [place] FL2 (Signed)
[place] MEDICAID FL2 LEVEL OF CARE SCREENING TOOL     IDENTIFICATION  Patient Name: Alan Warner Birthdate: 07-10-1960 Sex: male Admission Date (Current Location): 05/03/2016  [facility] [place]   Facility and Address:  [facility], [address], [place], [place] [address]      Provider Number: [id number]  Attending Physician Name and Address:  Drema Dallasurtis J Woods, MD  Relative Name and Phone Number:       Current Level of Care: Hospital Recommended Level of Care: Skilled Nursing Facility Prior Approval Number:    Date Approved/Denied:   PASRR Number:    Discharge Plan: SNF    Current Diagnoses: Patient Active Problem List   Diagnosis Date Noted  . GERD (gastroesophageal reflux disease) 05/03/2016  . Depression with anxiety 05/03/2016  . Sepsis (HCC) 05/03/2016  . Hyperglycemia 04/26/2016  . Acute encephalopathy 04/26/2016  . Severe sepsis (HCC)   . Anemia, chronic disease 12/24/2015  . Hypotension 12/24/2015  . Sepsis secondary to UTI (HCC) 12/12/2015  . HCAP (healthcare-associated pneumonia) 12/12/2015  . Pressure ulcer 12/12/2015  . Constipation   . Septic shock (HCC) 08/02/2015  . 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 Fecal impaction (HCC) 04/24/2015  . Hydronephrosis, right 04/24/2015  . Hypokalemia 04/24/2015  . Fever with multiple potential sources of infection 04/24/2015  . UTI (lower urinary tract infection) 04/24/2015  . Complicated UTI (urinary tract infection) 04/24/2015  . Cough 10/05/2014  . Paraplegia (HCC) 10/05/2014  . Chronic pain 10/05/2014  . Sacral decubitus ulcer 10/05/2014    Orientation RESPIRATION BLADDER Height & Weight     Self, Time, Situation, Place  O2 (2L [place]) Continent Weight: 175 lb 1.6 oz (79.4 kg) Height:  6\' 3"  (190.5 cm)  BEHAVIORAL SYMPTOMS/MOOD NEUROLOGICAL BOWEL NUTRITION STATUS      Incontinent Diet (see DC summary)  AMBULATORY STATUS COMMUNICATION OF NEEDS Skin   Extensive  Assist Verbally Normal                       Personal Care Assistance Level of Assistance  Bathing, Dressing Bathing Assistance: Maximum assistance   Dressing Assistance: Maximum assistance     Functional Limitations Info             SPECIAL CARE FACTORS FREQUENCY                       Contractures      Additional Factors Info  Code Status Code Status Info: FULL             Current Medications (05/06/2016):  This is the current hospital active medication list Current Facility-Administered Medications  Medication Dose Route Frequency Provider Last Rate Last Dose  . acetaminophen (TYLENOL) tablet 650 mg  650 mg Oral Q6H PRN Lorretta HarpXilin Niu, MD       Or  . acetaminophen (TYLENOL) suppository 650 mg  650 mg Rectal Q6H PRN Lorretta HarpXilin Niu, MD      . antiseptic oral rinse (BIOTENE) solution 15 mL  15 mL Mouth Rinse Daily PRN Lorretta HarpXilin Niu, MD      . baclofen (LIORESAL) tablet 10 mg  10 mg Oral QID Lorretta HarpXilin Niu, MD   10 mg at 05/06/16 1357  . bisacodyl (DULCOLAX) suppository 10 mg  10 mg Rectal Daily PRN Lorretta HarpXilin Niu, MD      . ceFEPIme (MAXIPIME) 2 g in dextrose 5 % 50 mL IVPB  2 g Intravenous Q8H Lauren  D Bajbus, RPH 100 mL/hr at 05/06/16 1359 2 g at 05/06/16 1359  . diphenhydrAMINE (BENADRYL) capsule 25 mg  25 mg Oral Q4H PRN Lorretta HarpXilin Niu, MD      . feeding supplement (ENSURE ENLIVE) (ENSURE ENLIVE) liquid 237 mL  237 mL Oral BID BM Lonia BloodJeffrey T McClung, MD   237 mL at 05/06/16 1359  . gi cocktail (Maalox,Lidocaine,Donnatal)  30 mL Oral TID PRN Lonia BloodJeffrey T McClung, MD   30 mL at 05/05/16 2142  . ipratropium-albuterol (DUONEB) 0.5-2.5 (3) MG/3ML nebulizer solution 3 mL  3 mL Nebulization TID Lorretta HarpXilin Niu, MD   3 mL at 05/06/16 1428  . linaclotide (LINZESS) capsule 145 mcg  145 mcg Oral Daily PRN Lorretta HarpXilin Niu, MD      . loperamide (IMODIUM) capsule 4 mg  4 mg Oral QID PRN Lorretta HarpXilin Niu, MD      . LORazepam (ATIVAN) tablet 0.5 mg  0.5 mg Oral Q6H PRN Lorretta HarpXilin Niu, MD   0.5 mg at 05/04/16 1718  .  multivitamin with minerals tablet 1 tablet  1 tablet Oral Daily Lorretta HarpXilin Niu, MD   1 tablet at 05/06/16 1026  . nicotine (NICODERM CQ - dosed in mg/24 hours) patch 21 mg  21 mg Transdermal Daily Lorretta HarpXilin Niu, MD      . ondansetron [facility](ZOFRAN) injection 4 mg  4 mg Intravenous Q8H PRN Lorretta HarpXilin Niu, MD   4 mg at 05/05/16 2156  . oxybutynin (DITROPAN) tablet 5 mg  5 mg Oral TID Lorretta HarpXilin Niu, MD   5 mg at 05/06/16 1025  . oxyCODONE (Oxy IR/ROXICODONE) immediate release tablet 30 mg  30 mg Oral Q4H PRN Lorretta HarpXilin Niu, MD   30 mg at 05/06/16 1025  . pantoprazole (PROTONIX) EC tablet 40 mg  40 mg Oral Daily Lorretta HarpXilin Niu, MD   40 mg at 05/06/16 1025  . polyethylene glycol (MIRALAX / GLYCOLAX) packet 17 g  17 g Oral BID PRN Lorretta HarpXilin Niu, MD   17 g at 05/05/16 1921  . polyvinyl alcohol (LIQUIFILM TEARS) 1.4 % ophthalmic solution 1 drop  1 drop Both Eyes QID PRN Lorretta HarpXilin Niu, MD      . pregabalin (LYRICA) capsule 100 mg  100 mg Oral BID Lorretta HarpXilin Niu, MD   100 mg at 05/06/16 1024  . rOPINIRole (REQUIP) tablet 0.5 mg  0.5 mg Oral QHS Lorretta HarpXilin Niu, MD   0.5 mg at 05/05/16 2206  . saccharomyces boulardii (FLORASTOR) capsule 250 mg  250 mg Oral BID Lonia BloodJeffrey T McClung, MD   250 mg at 05/06/16 1026  . sodium chloride flush (NS) 0.9 % injection 10-40 mL  10-40 mL Intracatheter Q12H Christina P Rama, MD   10 mL at 05/06/16 1026  . sodium chloride flush (NS) 0.9 % injection 10-40 mL  10-40 mL Intracatheter PRN Christina P Rama, MD      . zolpidem (AMBIEN) tablet 5 mg  5 mg Oral QHS PRN Lorretta HarpXilin Niu, MD         Discharge Medications: Please see discharge summary for a list of discharge medications.  Relevant Imaging Results:  Relevant Lab Results:   Additional Information ss# [id number]  Burna SisUris, Dan Dissinger H, [place]

## 2016-05-06 NOTE — Progress Notes (Signed)
Patient took off restricted extremity armband to his right arm. He does not want to wear it on his wrist. Patient given education on the importance of wearing the armband.

## 2016-05-06 NOTE — Progress Notes (Signed)
Pharmacy Antibiotic Note  Alan Warner is a 55 y.o. male admitted on 05/03/2016 with UTI.    Urine is growing 100 K Pseudomonas   Plan: Continue cefepime Consider dc of zyvox  Height: 6\' 3"  (190.5 cm) Weight: 175 lb 1.6 oz (79.4 kg) IBW/kg (Calculated) : 84.5  Temp (24hrs), Avg:97.9 F (36.6 C), Min:97.7 F (36.5 C), Max:98.3 F (36.8 C)   Recent Labs Lab 05/03/16 2122 05/03/16 2206 05/03/16 2245 05/04/16 0045 05/05/16 0410 05/06/16 0425  WBC 12.5*  --   --   --  4.6 4.1  CREATININE 1.00  --   --   --  1.03 0.88  LATICACIDVEN  --  1.21 1.87 0.8  --   --     Estimated Creatinine Clearance: 109 mL/min (by C-G formula based on SCr of 0.88 mg/dL).    Allergies  Allergen Reactions  . Levaquin [Levofloxacin In D5w] Other (See Comments)    PER MEDICATION MAR  . Penicillins Rash and Other (See Comments)    PER MEDICATION MAR  . Vancomycin Rash    PER MEDICATION MAR    Isaac BlissMichael Catilyn Boggus, PharmD, BCPS, [facility] HospitalBCCCP Clinical Pharmacist Phone [phone] 05/06/2016 9:48 AM

## 2016-05-06 NOTE — Care Management Note (Signed)
Case Management Note  Patient Details  Name: Glenice LaineJerry W Birchall MRN: [id number] Date of Birth: 06-23-1960  Subjective/Objective:   Paraplegia, Sepsis d/t UTI, sacral decub, Acute Encephalopathy                 Action/Plan: Discharge Planning: Chart reviewed. Pt was admitted [facility]. Contacted CSW with referral for SNF placement.    Expected Discharge Date:                 Expected Discharge Plan:  Skilled Nursing Facility  In-House Referral:  Clinical Social Work  Discharge planning Services  CM Consult  Post Acute Care Choice:  NA Choice offered to:  NA  DME Arranged:  N/A DME Agency:  NA  HH Arranged:  NA HH Agency:  NA  Status of Service:  Completed, signed off  If discussed at Long Length of Stay Meetings, dates discussed:    Additional Comments:  Elliot CousinShavis, Ianmichael Amescua Ellen, RN 05/06/2016, 3:19 PM

## 2016-05-06 NOTE — Care Management Important Message (Signed)
Important Message  Patient Details  Name: Alan Warner MRN: [id number] Date of Birth: 11/06/1960   Medicare Important Message Given:  Yes    Elliot CousinShavis, Natsha Guidry Ellen, RN 05/06/2016, 3:18 PM
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
## 2016-05-07 ENCOUNTER — Inpatient Hospital Stay (HOSPITAL_COMMUNITY): Payer: Medicare Other

## 2016-05-07 LAB — URINE CULTURE: Culture: >=100000 — AB

## 2016-05-07 MED ORDER — DEXTROSE 5 % IV SOLN: 2.0000 g | Freq: Three times a day (TID) | INTRAVENOUS | Status: DC

## 2016-05-07 MED ORDER — ENSURE ENLIVE PO LIQD: 237.0000 mL | Freq: Two times a day (BID) | ORAL | 12 refills | Status: AC

## 2016-05-07 MED ORDER — DEXTROSE 5 % IV SOLN
2.0000 g | Freq: Three times a day (TID) | INTRAVENOUS | Status: DC
  Administered 2016-05-07: 2 g via INTRAVENOUS
  Filled 2016-05-07 (×3): qty 2

## 2016-05-07 MED ORDER — MAGNESIUM HYDROXIDE 400 MG/5ML PO SUSP
960.0000 mL | Freq: Once | ORAL | Status: AC
  Administered 2016-05-07: 960 mL via RECTAL
  Filled 2016-05-07: qty 240

## 2016-05-07 NOTE — Progress Notes (Signed)
Patient will DC to: [facility] Anticipated DC date: 05/07/16 Family notified: N/A Transport by: PTAR 6:30pm   Per MD patient ready for DC to [facility]. RN, patient, patient's family, and facility notified of DC. Discharge Summary sent to facility. RN given number for report. DC packet on chart. Ambulance transport requested for patient.   CSW signing off.  Cristobal GoldmannNadia Ethlyn Alto, [place] Clinical Social Worker [phone]

## 2016-05-07 NOTE — Progress Notes (Signed)
Patient going to [facility]. [facility] [facility]. Patient transferred via PT[facility].  Noe GensStefanie A Jahree Dermody, RN
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
## 2016-05-07 NOTE — Plan of Care (Signed)
Problem: Education: Goal: Knowledge of [facility] Education information/materials will improve Outcome: Completed/Met Date Met: 05/07/16 Patient updated on [facility] policies and discharged to facility. Educated that he will continue antibiotics as outpatient.   Problem: Safety: Goal: Ability to remain free from injury will improve Outcome: Completed/Met Date Met: 05/07/16 Patient did not experience any injury during his hospitalization.   Problem: Health Behavior/Discharge Planning: Goal: Ability to manage health-related needs will improve Outcome: Completed/Met Date Met: 05/07/16 Patient stable to be discharged from [facility]. Discharged to a facility that can care for him to provide enough support.  Problem: Pain Managment: Goal: General experience of comfort will improve Outcome: Completed/Met Date Met: 05/07/16 Patient received pain medication and anxiety medication while inpatient. Will be discharged and able to resume home medications for pain.  Problem: Physical Regulation: Goal: Will remain free from infection Outcome: Completed/Met Date Met: 05/07/16 Patient will finish course of antibiotics as outpatient.   Problem: Skin Integrity: Goal: Risk for impaired skin integrity will decrease Outcome: Adequate for Discharge Patient did not experience any new skin breakdown during this hospitalization. Skin was treated and maintained at admission level during this admission.   Problem: Activity: Goal: Risk for activity intolerance will decrease Outcome: Completed/Met Date Met: 05/07/16 Patient assisted with turns and able to more self in bed.  Problem: Bowel/Gastric: Goal: Will not experience complications related to bowel motility Outcome: Completed/Met Date Met: 05/07/16 Patient was constipated today, 11/22; however after administration of SMOG enema- patient able to pass significant stool. Prior to enema patient abdomen showed distended colon and hard mass  palpable. After this had resolved. Patient able to return to [employer].

## 2016-05-07 NOTE — Clinical Social Work Note (Signed)
Clinical Social Work Assessment  Patient Details  Name: Alan Warner MRN: [id number] Date of Birth: Jun 10, 1961  Date of referral:  05/07/16               Reason for consult:  Discharge Planning                Permission sought to share information with:  Facility Medical sales representativeContact Representative, Family Supports Permission granted to share information::  Yes, Verbal Permission Granted  Name::     Manufacturing engineerCarmelia  Agency::  [facility]  Relationship::  Daughter  Contact Information:  [phone]  Housing/Transportation Living arrangements for the past 2 months:  Skilled Nursing Facility Source of Information:  Patient Patient Interpreter Needed:  None Criminal Activity/Legal Involvement Pertinent to Current Situation/Hospitalization:  No - Comment as needed Significant Relationships:  Adult Children, Parents Lives with:  Facility Resident Do you feel safe going back to the place where you live?  Yes Need for family participation in patient care:  No (Coment)  Care giving concerns:  CSW received consult for discharge planning. Patient is from [facility] and would like to return there at discharge. Patient presented [facility] He stated the nurse tech did not want to help him sit up and did not give his pain meds until hours later. He alerted his daughter, who spoke to the nursing supervisor regarding his concerns. CSW to continue to follow and assist with discharge planning needs.   Social Worker assessment / plan:  CSW spoke with patient regarding return to [facility].   Employment status:  Disabled (Comment on whether or not currently receiving Disability) Insurance information:  [place], [place] Recommend[place]  Not assessed at this time Information / Referral to community resources:  Skilled Nursing Facility  Patient/Family's Response to care:  [facility], but states he will  alert them to concerns he has been having about his care.  Patient/Family's Understanding of and Emotional Response to Diagnosis, Current Treatment, and Prognosis:  Patient/family is realistic regarding therapy needs and expressed being hopeful for SNF placement. Patient expressed understanding of CSW role and discharge process. No questions/concerns about plan or treatment.    Emotional Assessment Appearance:  Appears stated age Attitude/Demeanor/Rapport:  Complaining Affect (typically observed):  Appropriate, Frustrated Orientation:  Oriented to Self, Oriented to Situation, Oriented to Place, Oriented to  Time Alcohol / Substance use:  Not Applicable Psych involvement (Current and /or in the community):  No (Comment)  Discharge Needs  Concerns to be addressed:  Care Coordination Readmission within the last 30 days:  No Current discharge risk:  None Barriers to Discharge:  Continued Medical Work up   [employer] Brittnye Josephs, LCSWA 05/07/2016, 10:26 AM

## 2016-05-07 NOTE — Discharge Instructions (Signed)
Catheter-Associated Urinary Tract Infection FAQs °What is "catheter-associated urinary tract infection"?  °A urinary tract infection (also called “UTI”) is an infection in the urinary system, which includes the bladder (which stores the urine) and the kidneys (which filter the blood to make urine). Germs (for example, bacteria or yeasts) do not normally live in these areas; but if germs are introduced, an infection can occur. °If you have a urinary catheter, germs can travel along the catheter and cause an infection in your bladder or your kidney; in that case it is called a catheter-associated urinary tract infection (or “CA-UTI”). °What is a urinary catheter?  °A urinary catheter is a thin tube placed in the bladder to drain urine. Urine drains through the tube into a bag that collects the urine. A urinary catheter may be used: °· If you are not able to urinate on your own °· To measure the amount of urine that you make, for example, during intensive care °· During and after some types of surgery °· During some tests of the kidneys and bladder °People with urinary catheters have a much higher chance of getting a urinary tract infection than people who don’t have a catheter. °How do I get a catheter-associated urinary tract infection (CA-UTI)?  °If germs enter the urinary tract, they may cause an infection. Many of the germs that cause a catheter-associated urinary tract infection are common germs found in your intestines that do not usually cause an infection there. Germs can enter the urinary tract when the catheter is being put in or while the catheter remains in the bladder. °What are the symptoms of a urinary tract infection?  °Some of the common symptoms of a urinary tract infection are: °· Burning or pain in the lower abdomen (that is, below the stomach) °· Fever °· Bloody urine may be a sign of infection, but is also caused by other problems °· Burning during urination or an increase in the frequency of  urination after the catheter is removed. °Sometimes people with catheter-associated urinary tract infections do not have these symptoms of infection. °Can catheter-associated urinary tract infections be treated?  °Yes, most catheter-associated urinary tract infections can be treated with antibiotics and removal or change of the catheter. Your doctor will determine which antibiotic is best for you. °What are some of the things that hospitals are doing to prevent catheter-associated urinary tract infections?  °To prevent urinary tract infections, doctors and nurses take the following actions. °Catheter insertion °· Catheters are put in only when necessary and they are removed as soon as possible. °· Only properly trained persons insert catheters using sterile (“clean”) technique. °· The skin in the area where the catheter will be inserted is cleaned before inserting the catheter. °· Other methods to drain the urine are sometimes used, such as: °¨ External catheters in men (these look like condoms and are placed over the penis rather than into the penis) °¨ Putting a temporary catheter in to drain the urine and removing it right away. This is called intermittent urethral catheterization. °Catheter care °· Healthcare providers clean their hands by washing them with soap and water or using an alcohol-based hand rub before and after touching your catheter. °· If you do not see your providers clean their hands, please ask them to do so. °· Avoid disconnecting the catheter and drain tube. This helps to prevent germs from getting into the catheter tube. °· The catheter is secured to the leg to prevent pulling on the catheter. °·   Avoid twisting or kinking the catheter. °· Keep the bag lower than the bladder to prevent urine from backflowing to the bladder. °· Empty the bag regularly. The drainage spout should not touch anything while emptying the bag. °What can I do to help prevent catheter-associated urinary tract infections  if I have a catheter? °· Always clean your hands before and after doing catheter care. °· Always keep your urine bag below the level of your bladder. °· Do not tug or pull on the tubing. °· Do not twist or kink the catheter tubing. °· Ask your healthcare provider each day if you still need the catheter. °What do I need to do when I go home from the hospital? °· If you will be going home with a catheter, your doctor or nurse should explain everything you need to know about taking care of the catheter. Make sure you understand how to care for it before you leave the hospital. °· If you develop any of the symptoms of a urinary tract infection, such as burning or pain in the lower abdomen, fever, or an increase in the frequency of urination, contact your doctor or nurse immediately. °· Before you go home, make sure you know who to contact if you have questions or problems after you get home. °If you have questions, please ask your doctor or nurse.  °Developed and co-sponsored by The Society for Healthcare Epidemiology of America (SHEA); Infectious Diseases Society of America (IDSA); American Hospital Association; Association for Professionals in Infection Control and Epidemiology (APIC); Centers for Disease Control and Prevention (CDC); and The Joint Commission.  °This information is not intended to replace advice given to you by your health care provider. Make sure you discuss any questions you have with your health care provider. °Document Released: 02/25/2012 Document Revised: 11/14/2015 Document Reviewed: 08/16/2014 °Elsevier Interactive Patient Education © 2017 [employer]. ° °

## 2016-05-08 LAB — CULTURE, BLOOD (ROUTINE X 2)
Culture: NO GROWTH
Culture: NO GROWTH

## 2016-05-11 ENCOUNTER — Emergency Department (HOSPITAL_COMMUNITY): Payer: Medicare Other

## 2016-05-11 ENCOUNTER — Inpatient Hospital Stay (HOSPITAL_COMMUNITY)
Admission: EM | Admit: 2016-05-11 | Discharge: 2016-05-16 | DRG: 871 | Disposition: A | Discharge status: Skilled Nursing Facility | Payer: Medicare Other | Attending: Internal Medicine | Admitting: Internal Medicine

## 2016-05-11 DIAGNOSIS — K219 Gastro-esophageal reflux disease without esophagitis: Secondary | ICD-10-CM | POA: Diagnosis present

## 2016-05-11 DIAGNOSIS — F17210 Nicotine dependence, cigarettes, uncomplicated: Secondary | ICD-10-CM | POA: Diagnosis present

## 2016-05-11 DIAGNOSIS — Z79891 Long term (current) use of opiate analgesic: Secondary | ICD-10-CM

## 2016-05-11 DIAGNOSIS — D61818 Other pancytopenia: Secondary | ICD-10-CM | POA: Diagnosis present

## 2016-05-11 DIAGNOSIS — E876 Hypokalemia: Secondary | ICD-10-CM | POA: Diagnosis present

## 2016-05-11 DIAGNOSIS — G8220 Paraplegia, unspecified: Secondary | ICD-10-CM | POA: Diagnosis present

## 2016-05-11 DIAGNOSIS — B379 Candidiasis, unspecified: Secondary | ICD-10-CM | POA: Diagnosis present

## 2016-05-11 DIAGNOSIS — M4628 Osteomyelitis of vertebra, sacral and sacrococcygeal region: Secondary | ICD-10-CM | POA: Diagnosis present

## 2016-05-11 DIAGNOSIS — Z881 Allergy status to other antibiotic agents status: Secondary | ICD-10-CM

## 2016-05-11 DIAGNOSIS — G8250 Quadriplegia, unspecified: Secondary | ICD-10-CM | POA: Diagnosis present

## 2016-05-11 DIAGNOSIS — M609 Myositis, unspecified: Secondary | ICD-10-CM | POA: Diagnosis present

## 2016-05-11 DIAGNOSIS — L89894 Pressure ulcer of other site, stage 4: Secondary | ICD-10-CM | POA: Diagnosis present

## 2016-05-11 DIAGNOSIS — R05 Cough: Secondary | ICD-10-CM | POA: Diagnosis present

## 2016-05-11 DIAGNOSIS — L0390 Cellulitis, unspecified: Secondary | ICD-10-CM | POA: Diagnosis present

## 2016-05-11 DIAGNOSIS — M24451 Recurrent dislocation, right hip: Secondary | ICD-10-CM | POA: Diagnosis present

## 2016-05-11 DIAGNOSIS — L89154 Pressure ulcer of sacral region, stage 4: Secondary | ICD-10-CM | POA: Diagnosis present

## 2016-05-11 DIAGNOSIS — N319 Neuromuscular dysfunction of bladder, unspecified: Secondary | ICD-10-CM | POA: Diagnosis present

## 2016-05-11 DIAGNOSIS — G8929 Other chronic pain: Secondary | ICD-10-CM | POA: Diagnosis present

## 2016-05-11 DIAGNOSIS — L89893 Pressure ulcer of other site, stage 3: Secondary | ICD-10-CM | POA: Diagnosis present

## 2016-05-11 DIAGNOSIS — R739 Hyperglycemia, unspecified: Secondary | ICD-10-CM | POA: Diagnosis present

## 2016-05-11 DIAGNOSIS — R059 Cough, unspecified: Secondary | ICD-10-CM | POA: Diagnosis present

## 2016-05-11 DIAGNOSIS — K5903 Drug induced constipation: Secondary | ICD-10-CM | POA: Diagnosis present

## 2016-05-11 DIAGNOSIS — R112 Nausea with vomiting, unspecified: Secondary | ICD-10-CM | POA: Diagnosis present

## 2016-05-11 DIAGNOSIS — A419 Sepsis, unspecified organism: Principal | ICD-10-CM | POA: Diagnosis present

## 2016-05-11 DIAGNOSIS — T40605A Adverse effect of unspecified narcotics, initial encounter: Secondary | ICD-10-CM | POA: Diagnosis present

## 2016-05-11 DIAGNOSIS — I959 Hypotension, unspecified: Secondary | ICD-10-CM

## 2016-05-11 DIAGNOSIS — L89159 Pressure ulcer of sacral region, unspecified stage: Secondary | ICD-10-CM | POA: Diagnosis present

## 2016-05-11 DIAGNOSIS — Z79899 Other long term (current) drug therapy: Secondary | ICD-10-CM

## 2016-05-11 DIAGNOSIS — Z9359 Other cystostomy status: Secondary | ICD-10-CM

## 2016-05-11 DIAGNOSIS — R5383 Other fatigue: Secondary | ICD-10-CM | POA: Diagnosis present

## 2016-05-11 DIAGNOSIS — M25559 Pain in unspecified hip: Secondary | ICD-10-CM

## 2016-05-11 DIAGNOSIS — Z880 Allergy status to penicillin: Secondary | ICD-10-CM

## 2016-05-11 DIAGNOSIS — D638 Anemia in other chronic diseases classified elsewhere: Secondary | ICD-10-CM | POA: Diagnosis present

## 2016-05-11 DIAGNOSIS — M25551 Pain in right hip: Secondary | ICD-10-CM | POA: Diagnosis present

## 2016-05-11 LAB — COMPREHENSIVE METABOLIC PANEL
ALK PHOS: 72 U/L (ref 38–126)
ALT: 15 U/L — ABNORMAL LOW (ref 17–63)
ANION GAP: 8 (ref 5–15)
AST: 27 U/L (ref 15–41)
Albumin: 1.5 g/dL — ABNORMAL LOW (ref 3.5–5.0)
BILIRUBIN TOTAL: 0.7 mg/dL (ref 0.3–1.2)
BUN: 12 mg/dL (ref 6–20)
CALCIUM: 6.9 mg/dL — AB (ref 8.9–10.3)
CO2: 16 mmol/L — AB (ref 22–32)
Chloride: 111 mmol/L (ref 101–111)
Creatinine, Ser: 1.01 mg/dL (ref 0.61–1.24)
GFR calc non Af Amer: >60 mL/min (ref >60)
Glucose, Bld: 80 mg/dL (ref 65–99)
POTASSIUM: 2.7 mmol/L — AB (ref 3.5–5.1)
SODIUM: 135 mmol/L (ref 135–145)
TOTAL PROTEIN: 5.3 g/dL — AB (ref 6.5–8.1)

## 2016-05-11 LAB — CBC WITH DIFFERENTIAL/PLATELET
BASOS ABS: 0 10*3/uL (ref 0.0–0.1)
BASOS PCT: 0 %
Eosinophils Absolute: 0 10*3/uL (ref 0.0–0.7)
Eosinophils Relative: 1 %
HEMATOCRIT: 23.3 % — AB (ref 39.0–52.0)
HEMOGLOBIN: 7.5 g/dL — AB (ref 13.0–17.0)
LYMPHS PCT: 24 %
Lymphs Abs: 0.7 10*3/uL (ref 0.7–4.0)
MCH: 23.4 pg — ABNORMAL LOW (ref 26.0–34.0)
MCHC: 32.2 g/dL (ref 30.0–36.0)
MCV: 72.8 fL — ABNORMAL LOW (ref 78.0–100.0)
Monocytes Absolute: 0.4 10*3/uL (ref 0.1–1.0)
Monocytes Relative: 12 %
NEUTROS ABS: 1.9 10*3/uL (ref 1.7–7.7)
NEUTROS PCT: 64 %
Platelets: 72 10*3/uL — ABNORMAL LOW (ref 150–400)
RBC: 3.2 MIL/uL — ABNORMAL LOW (ref 4.22–5.81)
RDW: 17.7 % — ABNORMAL HIGH (ref 11.5–15.5)
WBC: 3 10*3/uL — ABNORMAL LOW (ref 4.0–10.5)

## 2016-05-11 LAB — I-STAT CG4 LACTIC ACID, ED: Lactic Acid, Venous: 0.68 mmol/L (ref 0.5–1.9)

## 2016-05-11 MED ORDER — SODIUM CHLORIDE 0.9 % IV SOLN
30.0000 meq | Freq: Once | INTRAVENOUS | Status: AC
  Administered 2016-05-12: 30 meq via INTRAVENOUS
  Filled 2016-05-11: qty 15

## 2016-05-11 MED ORDER — SODIUM CHLORIDE 0.9 % IV BOLUS (SEPSIS)
1000.0000 mL | Freq: Once | INTRAVENOUS | Status: AC
  Administered 2016-05-11: 1000 mL via INTRAVENOUS

## 2016-05-11 MED ORDER — SODIUM CHLORIDE 0.9 % IV SOLN
1.0000 g | Freq: Three times a day (TID) | INTRAVENOUS | Status: DC
  Administered 2016-05-11 – 2016-05-15 (×11): 1 g via INTRAVENOUS
  Filled 2016-05-11 (×18): qty 1

## 2016-05-11 MED ORDER — SODIUM CHLORIDE 0.9 % IV BOLUS (SEPSIS): 500.0000 mL | Freq: Once | INTRAVENOUS | Status: AC

## 2016-05-11 MED ORDER — FENTANYL CITRATE (PF) 100 MCG/2ML IJ SOLN
100.0000 ug | Freq: Once | INTRAMUSCULAR | Status: AC
  Administered 2016-05-11: 100 ug via INTRAVENOUS
  Filled 2016-05-11: qty 2
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
## 2016-05-11 NOTE — Progress Notes (Signed)
Pharmacy Antibiotic Note  Alan Warner is a 55 y.o. male admitted on 05/11/2016 with sepsis.  Pharmacy has been consulted for Merrem dosing. Recently discharged for severe sepsis/complicated Pseudomonas UTI. Here today with lethargy and hypotension.  Plan: -Merrem 1g IV q8h -Trend WBC, temp, renal function  -F/U cultures and narrow anti-biotic regimen as able  Height: 6\' 3"  (190.5 cm) Weight: 178 lb (80.7 kg) IBW/kg (Calculated) : 84.5  Temp (24hrs), Avg:97.4 F (36.3 C), Min:97.4 F (36.3 C), Max:97.4 F (36.3 C)   Recent Labs Lab 05/05/16 0410 05/06/16 0425  WBC 4.6 4.1  CREATININE 1.03 0.88    Estimated Creatinine Clearance: 110.8 mL/min (by C-G formula based on SCr of 0.88 mg/dL).    Allergies  Allergen Reactions  . Levaquin [Levofloxacin In D5w] Other (See Comments)    PER MEDICATION MAR  . Penicillins Rash and Other (See Comments)    PER MEDICATION MAR  . Vancomycin Rash    PER MEDICATION MAR    Alan Warner, Alan Warner 05/11/2016 10:27 PM

## 2016-05-11 NOTE — ED Triage Notes (Signed)
BIB EMS.  SNF noticed pt was lethargic, checked vital signs and was found to be hypotensive.  Initial BP 67/53.  EMS gave 500 mL bolus.  PICC line noted right upper arm

## 2016-05-11 NOTE — ED Notes (Signed)
[facility] contacted to activate Code Sepsis 

## 2016-05-12 ENCOUNTER — Observation Stay (HOSPITAL_COMMUNITY): Payer: Medicare Other

## 2016-05-12 DIAGNOSIS — M25551 Pain in right hip: Secondary | ICD-10-CM | POA: Diagnosis present

## 2016-05-12 DIAGNOSIS — L89154 Pressure ulcer of sacral region, stage 4: Secondary | ICD-10-CM | POA: Diagnosis present

## 2016-05-12 DIAGNOSIS — T40605A Adverse effect of unspecified narcotics, initial encounter: Secondary | ICD-10-CM | POA: Diagnosis present

## 2016-05-12 DIAGNOSIS — L89159 Pressure ulcer of sacral region, unspecified stage: Secondary | ICD-10-CM | POA: Diagnosis not present

## 2016-05-12 DIAGNOSIS — G8929 Other chronic pain: Secondary | ICD-10-CM | POA: Diagnosis present

## 2016-05-12 DIAGNOSIS — I959 Hypotension, unspecified: Secondary | ICD-10-CM | POA: Diagnosis present

## 2016-05-12 DIAGNOSIS — L89319 Pressure ulcer of right buttock, unspecified stage: Secondary | ICD-10-CM | POA: Diagnosis not present

## 2016-05-12 DIAGNOSIS — D638 Anemia in other chronic diseases classified elsewhere: Secondary | ICD-10-CM | POA: Diagnosis present

## 2016-05-12 DIAGNOSIS — D61810 Antineoplastic chemotherapy induced pancytopenia: Secondary | ICD-10-CM | POA: Diagnosis not present

## 2016-05-12 DIAGNOSIS — B379 Candidiasis, unspecified: Secondary | ICD-10-CM | POA: Diagnosis present

## 2016-05-12 DIAGNOSIS — K219 Gastro-esophageal reflux disease without esophagitis: Secondary | ICD-10-CM | POA: Diagnosis present

## 2016-05-12 DIAGNOSIS — A419 Sepsis, unspecified organism: Secondary | ICD-10-CM | POA: Diagnosis present

## 2016-05-12 DIAGNOSIS — M24451 Recurrent dislocation, right hip: Secondary | ICD-10-CM | POA: Diagnosis present

## 2016-05-12 DIAGNOSIS — M4628 Osteomyelitis of vertebra, sacral and sacrococcygeal region: Secondary | ICD-10-CM | POA: Diagnosis present

## 2016-05-12 DIAGNOSIS — R5383 Other fatigue: Secondary | ICD-10-CM | POA: Diagnosis present

## 2016-05-12 DIAGNOSIS — M609 Myositis, unspecified: Secondary | ICD-10-CM | POA: Diagnosis present

## 2016-05-12 DIAGNOSIS — L89329 Pressure ulcer of left buttock, unspecified stage: Secondary | ICD-10-CM | POA: Diagnosis not present

## 2016-05-12 DIAGNOSIS — N319 Neuromuscular dysfunction of bladder, unspecified: Secondary | ICD-10-CM | POA: Diagnosis present

## 2016-05-12 DIAGNOSIS — G8250 Quadriplegia, unspecified: Secondary | ICD-10-CM | POA: Diagnosis not present

## 2016-05-12 DIAGNOSIS — K5903 Drug induced constipation: Secondary | ICD-10-CM | POA: Diagnosis present

## 2016-05-12 DIAGNOSIS — L89894 Pressure ulcer of other site, stage 4: Secondary | ICD-10-CM | POA: Diagnosis present

## 2016-05-12 DIAGNOSIS — L89304 Pressure ulcer of unspecified buttock, stage 4: Secondary | ICD-10-CM | POA: Diagnosis not present

## 2016-05-12 DIAGNOSIS — L89893 Pressure ulcer of other site, stage 3: Secondary | ICD-10-CM | POA: Diagnosis present

## 2016-05-12 DIAGNOSIS — F17210 Nicotine dependence, cigarettes, uncomplicated: Secondary | ICD-10-CM | POA: Diagnosis present

## 2016-05-12 DIAGNOSIS — E876 Hypokalemia: Secondary | ICD-10-CM | POA: Diagnosis present

## 2016-05-12 DIAGNOSIS — R112 Nausea with vomiting, unspecified: Secondary | ICD-10-CM | POA: Diagnosis present

## 2016-05-12 DIAGNOSIS — L0390 Cellulitis, unspecified: Secondary | ICD-10-CM | POA: Diagnosis present

## 2016-05-12 DIAGNOSIS — D61818 Other pancytopenia: Secondary | ICD-10-CM | POA: Diagnosis present

## 2016-05-12 DIAGNOSIS — A4152 Sepsis due to Pseudomonas: Secondary | ICD-10-CM | POA: Diagnosis not present

## 2016-05-12 LAB — CBC
HCT: 20.9 % — ABNORMAL LOW (ref 39.0–52.0)
HEMOGLOBIN: 6.8 g/dL — AB (ref 13.0–17.0)
MCH: 23.5 pg — AB (ref 26.0–34.0)
MCHC: 32.5 g/dL (ref 30.0–36.0)
MCV: 72.3 fL — AB (ref 78.0–100.0)
Platelets: 50 10*3/uL — ABNORMAL LOW (ref 150–400)
RBC: 2.89 MIL/uL — ABNORMAL LOW (ref 4.22–5.81)
RDW: 17.9 % — ABNORMAL HIGH (ref 11.5–15.5)
WBC: 2.7 10*3/uL — ABNORMAL LOW (ref 4.0–10.5)

## 2016-05-12 LAB — COMPREHENSIVE METABOLIC PANEL
ALBUMIN: 1.4 g/dL — AB (ref 3.5–5.0)
ALK PHOS: 84 U/L (ref 38–126)
ALT: 19 U/L (ref 17–63)
ANION GAP: 6 (ref 5–15)
AST: 29 U/L (ref 15–41)
BUN: 10 mg/dL (ref 6–20)
CALCIUM: 6.7 mg/dL — AB (ref 8.9–10.3)
CHLORIDE: 117 mmol/L — AB (ref 101–111)
CO2: 17 mmol/L — AB (ref 22–32)
CREATININE: 0.95 mg/dL (ref 0.61–1.24)
GFR calc Af Amer: >60 mL/min (ref >60)
GFR calc non Af Amer: >60 mL/min (ref >60)
GLUCOSE: 94 mg/dL (ref 65–99)
Potassium: 2.6 mmol/L — CL (ref 3.5–5.1)
SODIUM: 140 mmol/L (ref 135–145)
Total Bilirubin: 0.7 mg/dL (ref 0.3–1.2)
Total Protein: 5.1 g/dL — ABNORMAL LOW (ref 6.5–8.1)

## 2016-05-12 LAB — URINALYSIS, ROUTINE W REFLEX MICROSCOPIC
BILIRUBIN URINE: NEGATIVE
Glucose, UA: NEGATIVE mg/dL
KETONES UR: 15 mg/dL — AB
NITRITE: NEGATIVE
Protein, ur: 30 mg/dL — AB
SPECIFIC GRAVITY, URINE: 1.006 (ref 1.005–1.030)
pH: 6 (ref 5.0–8.0)

## 2016-05-12 LAB — URINE MICROSCOPIC-ADD ON

## 2016-05-12 LAB — PREPARE RBC (CROSSMATCH)

## 2016-05-12 LAB — HEMOGLOBIN AND HEMATOCRIT, BLOOD
HEMATOCRIT: 26.7 % — AB (ref 39.0–52.0)
HEMOGLOBIN: 9.2 g/dL — AB (ref 13.0–17.0)

## 2016-05-12 MED ORDER — LORAZEPAM 0.5 MG PO TABS
0.5000 mg | ORAL_TABLET | Freq: Four times a day (QID) | ORAL | Status: DC | PRN
Start: 2016-05-12 — End: 2016-05-16

## 2016-05-12 MED ORDER — SODIUM CHLORIDE 0.9 % IV BOLUS (SEPSIS)
1000.0000 mL | Freq: Once | INTRAVENOUS | Status: AC
  Administered 2016-05-12: 1000 mL via INTRAVENOUS

## 2016-05-12 MED ORDER — POLYVINYL ALCOHOL 1.4 % OP SOLN: 1.0000 [drp] | Freq: Four times a day (QID) | OPHTHALMIC | Status: DC | PRN

## 2016-05-12 MED ORDER — LINACLOTIDE 145 MCG PO CAPS
145.0000 ug | ORAL_CAPSULE | Freq: Every day | ORAL | Status: DC | PRN
  Administered 2016-05-12: 145 ug via ORAL
  Filled 2016-05-12 (×2): qty 1

## 2016-05-12 MED ORDER — OXYBUTYNIN CHLORIDE 5 MG PO TABS
5.0000 mg | ORAL_TABLET | Freq: Three times a day (TID) | ORAL | Status: DC
  Administered 2016-05-12 – 2016-05-16 (×11): 5 mg via ORAL
  Filled 2016-05-12 (×14): qty 1

## 2016-05-12 MED ORDER — LINEZOLID 600 MG/300ML IV SOLN
600.0000 mg | Freq: Two times a day (BID) | INTRAVENOUS | Status: DC
  Administered 2016-05-12 (×2): 600 mg via INTRAVENOUS
  Filled 2016-05-12 (×6): qty 300

## 2016-05-12 MED ORDER — ENSURE ENLIVE PO LIQD
237.0000 mL | Freq: Two times a day (BID) | ORAL | Status: DC
  Administered 2016-05-13 – 2016-05-16 (×4): 237 mL via ORAL

## 2016-05-12 MED ORDER — ROPINIROLE HCL 1 MG PO TABS
0.5000 mg | ORAL_TABLET | Freq: Every day | ORAL | Status: DC
  Administered 2016-05-12 – 2016-05-15 (×4): 0.5 mg via ORAL
  Filled 2016-05-12 (×5): qty 1

## 2016-05-12 MED ORDER — PANTOPRAZOLE SODIUM 40 MG PO TBEC
40.0000 mg | DELAYED_RELEASE_TABLET | Freq: Every day | ORAL | Status: DC
  Administered 2016-05-16: 40 mg via ORAL
  Filled 2016-05-12 (×5): qty 1

## 2016-05-12 MED ORDER — SODIUM CHLORIDE 0.9 % IV SOLN
Freq: Once | INTRAVENOUS | Status: AC
  Administered 2016-05-12: 09:00:00 via INTRAVENOUS

## 2016-05-12 MED ORDER — BIOTENE DRY MOUTH MT LIQD: 15.0000 mL | Freq: Every day | OROMUCOSAL | Status: DC | PRN

## 2016-05-12 MED ORDER — PREGABALIN 50 MG PO CAPS
100.0000 mg | ORAL_CAPSULE | Freq: Two times a day (BID) | ORAL | Status: DC
  Administered 2016-05-12 – 2016-05-16 (×9): 100 mg via ORAL
  Filled 2016-05-12 (×9): qty 2

## 2016-05-12 MED ORDER — OXYCODONE HCL 5 MG PO TABS
30.0000 mg | ORAL_TABLET | ORAL | Status: DC | PRN
  Administered 2016-05-12 – 2016-05-16 (×19): 30 mg via ORAL
  Filled 2016-05-12 (×21): qty 6

## 2016-05-12 MED ORDER — ORAL CARE MOUTH RINSE
15.0000 mL | Freq: Two times a day (BID) | OROMUCOSAL | Status: DC
  Administered 2016-05-16: 15 mL via OROMUCOSAL

## 2016-05-12 MED ORDER — BACLOFEN 10 MG PO TABS
10.0000 mg | ORAL_TABLET | Freq: Three times a day (TID) | ORAL | Status: DC
Start: 2016-05-12 — End: 2016-05-16
  Administered 2016-05-12 – 2016-05-16 (×16): 10 mg via ORAL
  Filled 2016-05-12: qty 1
  Filled 2016-05-12: qty 2
  Filled 2016-05-12 (×7): qty 1
  Filled 2016-05-12: qty 2
  Filled 2016-05-12 (×10): qty 1

## 2016-05-12 MED ORDER — DIPHENHYDRAMINE HCL 25 MG PO CAPS: 25.0000 mg | ORAL_CAPSULE | ORAL | Status: DC | PRN

## 2016-05-12 MED ORDER — ENOXAPARIN SODIUM 40 MG/0.4ML ~~LOC~~ SOLN
40.0000 mg | SUBCUTANEOUS | Status: DC
  Administered 2016-05-13: 40 mg via SUBCUTANEOUS
  Filled 2016-05-12 (×2): qty 0.4

## 2016-05-12 MED ORDER — IPRATROPIUM-ALBUTEROL 0.5-2.5 (3) MG/3ML IN SOLN
3.0000 mL | Freq: Three times a day (TID) | RESPIRATORY_TRACT | Status: DC
  Administered 2016-05-12 – 2016-05-13 (×4): 3 mL via RESPIRATORY_TRACT
  Filled 2016-05-12 (×9): qty 3

## 2016-05-12 MED ORDER — SIMETHICONE 80 MG PO CHEW
80.0000 mg | CHEWABLE_TABLET | Freq: Four times a day (QID) | ORAL | Status: DC | PRN
  Filled 2016-05-12: qty 1

## 2016-05-12 MED ORDER — POTASSIUM CHLORIDE CRYS ER 20 MEQ PO TBCR
40.0000 meq | EXTENDED_RELEASE_TABLET | Freq: Every day | ORAL | Status: DC
  Administered 2016-05-12 – 2016-05-13 (×2): 40 meq via ORAL
  Filled 2016-05-12 (×2): qty 2

## 2016-05-12 MED ORDER — GADOBENATE DIMEGLUMINE 529 MG/ML IV SOLN
15.0000 mL | Freq: Once | INTRAVENOUS | Status: AC | PRN
  Administered 2016-05-12: 15 mL via INTRAVENOUS

## 2016-05-12 MED ORDER — ADULT MULTIVITAMIN W/MINERALS CH
1.0000 | ORAL_TABLET | Freq: Every day | ORAL | Status: DC
  Administered 2016-05-16: 1 via ORAL
  Filled 2016-05-12 (×5): qty 1

## 2016-05-12 MED ORDER — BISACODYL 10 MG RE SUPP: 10.0000 mg | Freq: Every day | RECTAL | Status: DC | PRN

## 2016-05-12 MED ORDER — POLYETHYLENE GLYCOL 3350 17 G PO PACK: 17.0000 g | PACK | Freq: Two times a day (BID) | ORAL | Status: DC | PRN

## 2016-05-12 NOTE — ED Notes (Signed)
Pt in MRI.

## 2016-05-12 NOTE — Progress Notes (Signed)
Patient ID: Alan LaineJerry W Botting, male   DOB: Aug 18, 1960, 55 y.o.   MRN: [id number]  Pt had Blood pressure decrease to 75/40.   Pt reports he feels cold and weak.   I gave pt 1000 ns.  Blood pressure increased to 100/48.  Pt has hemoglobin of 6.8.   Pt reports he has had transfusions in the past.  Previous labs reviewed.  Pt had hemoglobin of 10.1 nine days ago.  Type and Rh and transfusion ordered.
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
## 2016-05-12 NOTE — ED Notes (Signed)
Pt given ice water per Gladstone LighterAlecia (RN)

## 2016-05-12 NOTE — ED Notes (Signed)
Patient was cleaned and repositioned in bed. Linen and pull-up changed with Luisa HartPatrick EMT.
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
## 2016-05-12 NOTE — Consult Note (Signed)
WOC Nurse wound consult note Reason for Consult:chronic pressure injuries, known to WOC team Wound type: Stage 3 Pressure Injury: left ischial; 0.5cm x 0.5cm x 0.1cm Stage 4 Pressure Injury: right ischial; 9cm x 1.5cm x 0.2cm  Stage 4 Pressure Injury: sacrum; 3.5cm x 2.5cm x 1.0cm  Healed area on the upper left back Pressure Ulcer POA: Yes Measurement: see above Dressing procedure/placement/frequency: Clean all wounds with normal saline Pack sacral wound with 2x2 gauze moistened with saline, change daily. Cover right and left ischial wounds with silicone foam, change every 3 days and PRN soilage. WOC assessed wounds last week, will reassess once patient arrives to hospital room, communicated with the ED RN that wound care orders have been placed in the computer.  Will request low air loss mattress for patient, however he has refused mattress in the past.    Discussed POC with bedside nurse.  Re consult if needed, will not follow at this time. Thanks  Angelys Yetman M.[employer] MSN, RN,CWOCN, CNS [phone]

## 2016-05-12 NOTE — ED Notes (Signed)
Pt still in MRI 

## 2016-05-12 NOTE — ED Notes (Signed)
Delay in lab draw  Pt not in room 

## 2016-05-12 NOTE — ED Notes (Signed)
Pt. Stated needed pain meds notified Alecia Banker(RN)

## 2016-05-12 NOTE — ED Notes (Signed)
Pt requesting a drink. Will notify the RN when available.

## 2016-05-12 NOTE — ED Notes (Signed)
BP rechecked. BP cuff was malpositioned. Last BP was 122/87.

## 2016-05-12 NOTE — ED Notes (Signed)
Attempted report x1. RN to call Alan Warner [phone].
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
## 2016-05-13 DIAGNOSIS — G8250 Quadriplegia, unspecified: Secondary | ICD-10-CM

## 2016-05-13 DIAGNOSIS — A4152 Sepsis due to Pseudomonas: Secondary | ICD-10-CM

## 2016-05-13 LAB — TYPE AND SCREEN
ABO/RH(D): A NEG
ANTIBODY SCREEN: NEGATIVE
UNIT DIVISION: 0
Unit division: 0

## 2016-05-13 LAB — EXPECTORATED SPUTUM ASSESSMENT W REFEX TO RESP CULTURE

## 2016-05-13 LAB — BASIC METABOLIC PANEL
ANION GAP: 7 (ref 5–15)
BUN: 6 mg/dL (ref 6–20)
CALCIUM: 6.6 mg/dL — AB (ref 8.9–10.3)
CO2: 19 mmol/L — AB (ref 22–32)
CREATININE: 0.83 mg/dL (ref 0.61–1.24)
Chloride: 113 mmol/L — ABNORMAL HIGH (ref 101–111)
Glucose, Bld: 72 mg/dL (ref 65–99)
Potassium: 2.3 mmol/L — CL (ref 3.5–5.1)
Sodium: 139 mmol/L (ref 135–145)

## 2016-05-13 LAB — EXPECTORATED SPUTUM ASSESSMENT W GRAM STAIN, RFLX TO RESP C

## 2016-05-13 MED ORDER — POTASSIUM CHLORIDE 2 MEQ/ML IV SOLN
INTRAVENOUS | Status: DC
  Administered 2016-05-13: 16:00:00 via INTRAVENOUS
  Filled 2016-05-13 (×3): qty 1000

## 2016-05-13 MED ORDER — ALTEPLASE 2 MG IJ SOLR
2.0000 mg | Freq: Once | INTRAMUSCULAR | Status: AC
  Administered 2016-05-13: 2 mg
  Filled 2016-05-13: qty 2

## 2016-05-13 MED ORDER — NYSTATIN 100000 UNIT/ML MT SUSP
5.0000 mL | Freq: Four times a day (QID) | OROMUCOSAL | Status: DC
  Administered 2016-05-13 – 2016-05-16 (×4): 500000 [IU] via ORAL
  Filled 2016-05-13 (×6): qty 5

## 2016-05-13 MED ORDER — SODIUM CHLORIDE 0.9 % IV SOLN
30.0000 meq | Freq: Two times a day (BID) | INTRAVENOUS | Status: AC
  Administered 2016-05-13 – 2016-05-14 (×3): 30 meq via INTRAVENOUS
  Filled 2016-05-13 (×4): qty 15

## 2016-05-13 MED ORDER — SODIUM CHLORIDE 0.9 % IV BOLUS (SEPSIS)
1000.0000 mL | Freq: Once | INTRAVENOUS | Status: AC
  Administered 2016-05-13: 1000 mL via INTRAVENOUS

## 2016-05-13 NOTE — Clinical Social Work Note (Addendum)
Clinical Social Work Assessment  Patient Details  Name: Alan Warner MRN: [id number] Date of Birth: Sep 30, 1960  Date of referral:  05/13/16               Reason for consult:  Discharge Planning                Permission sought to share information with:  Facility Medical sales representativeContact Representative, Family Supports Permission granted to share information::  Yes, Verbal Permission Granted  Name::     Manufacturing engineerCarmelia  Agency::  [facility]  Relationship::  Daughter  Contact Information:     Housing/Transportation Living arrangements for the past 2 months:  Skilled Building surveyorursing Facility Source of Information:  Patient, Parent Patient Interpreter Needed:  None Criminal Activity/Legal Involvement Pertinent to Current Situation/Hospitalization:  No - Comment as needed Significant Relationships:  Adult Children, Parents Lives with:  Facility Resident Do you feel safe going back to the place where you live?  Yes Need for family participation in patient care:  No (Coment)  Care giving concerns:  CSW received consult regarding discharge planning. [facility] and will return there at discharge. CSW to continue to follow.    Social Worker assessment / plan:  [facility]  Employment status:  Disabled (Comment on whether or not currently receiving Disability) Insurance information:  [place] Recommendations:  Not assessed at this time Information / Referral to community resources:  Skilled Nursing Facility  Patient/Family's Response to care:  Patient is known to CSW. Patient reported that he is having a better stay this admission.   Patient/Family's Understanding of and Emotional Response to Diagnosis, Current Treatment, and Prognosis:  Patient/family is realistic regarding therapy needs. Patient expressed understanding of CSW role and discharge process. No questions/concerns about plan or treatment.    Emotional  Assessment Appearance:  Appears stated age Attitude/Demeanor/Rapport:  Other (Appropriate) Affect (typically observed):  Accepting, Appropriate Orientation:  Oriented to Self, Oriented to Situation, Oriented to Place, Oriented to  Time Alcohol / Substance use:  Not Applicable Psych involvement (Current and /or in the community):  No (Comment)  Discharge Needs  Concerns to be addressed:  Care Coordination Readmission within the last 30 days:  No Current discharge risk:  None Barriers to Discharge:  Continued Medical Work up   [employer] S Amila Callies, LCSWA 05/13/2016, 3:53 PM
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
## 2016-05-13 NOTE — [place] FL2 (Signed)
[place] MEDICAID FL2 LEVEL OF CARE SCREENING TOOL     IDENTIFICATION  Patient Name: Alan LaineJerry W Escalona Birthdate: 1961/02/12 Sex: male Admission Date (Current Location): 05/11/2016  [facility] [place]   Facility and Address:  [facility], [address], [place], [place] [address]      Provider Number: [id number]  Attending Physician Name and Address:  Tyrone Nineyan B Grunz, MD  Relative Name and Phone Number:       Current Level of Care: Hospital Recommended Level of Care: Skilled Nursing Facility Prior Approval Number:    Date Approved/Denied:   PASRR Number:    Discharge Plan: SNF    Current Diagnoses: Patient Active Problem List   Diagnosis Date Noted  . Hypotension 05/12/2016  . Urinary tract infection associated with indwelling urethral catheter (HCC)   . Pseudomonas infection   . GERD (gastroesophageal reflux disease) 05/03/2016  . Depression with anxiety 05/03/2016  . Hyperglycemia 04/26/2016  . Anemia, chronic disease 12/24/2015  . Sepsis secondary to UTI (HCC) 12/12/2015  . HCAP (healthcare-associated pneumonia) 12/12/2015  . Pressure ulcer 12/12/2015  . Constipation   . Septic shock (HCC) 08/02/2015  . 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 Fecal impaction (HCC) 04/24/2015  . Hydronephrosis, right 04/24/2015  . Hypokalemia 04/24/2015  . Fever with multiple potential sources of infection 04/24/2015  . UTI (lower urinary tract infection) 04/24/2015  . Complicated UTI (urinary tract infection) 04/24/2015  . Cough 10/05/2014  . Paraplegia (HCC) 10/05/2014  . Chronic pain 10/05/2014  . Sacral decubitus ulcer 10/05/2014    Orientation RESPIRATION BLADDER Height & Weight     Self, Time, Situation, Place  Normal Incontinent, Indwelling catheter (Urethral catheter) Weight: 80.7 kg (178 lb) Height:  6\' 3"  (190.5 cm)  BEHAVIORAL SYMPTOMS/MOOD NEUROLOGICAL BOWEL NUTRITION STATUS      Incontinent Diet (Please see DC Summary)   AMBULATORY STATUS COMMUNICATION OF NEEDS Skin   Extensive Assist Verbally PU Stage and Appropriate Care (Stage III pressure injury on buttocks, sacrum; Unstageable on hip; Stage IV on sacrum, ischial tuberosity;)                       Personal Care Assistance Level of Assistance  Bathing, Dressing Bathing Assistance: Maximum assistance   Dressing Assistance: Maximum assistance     Functional Limitations Info             SPECIAL CARE FACTORS FREQUENCY                       Contractures      Additional Factors Info  Code Status, Allergies, Isolation Precautions Code Status Info: Full Allergies Info: Levaquin Levofloxacin In D5w, Penicillins, Vancomycin     Isolation Precautions Info: MRSA     Current Medications (05/13/2016):  This is the current hospital active medication list Current Facility-Administered Medications  Medication Dose Route Frequency Provider Last Rate Last Dose  . antiseptic oral rinse (BIOTENE) solution 15 mL  15 mL Mouth Rinse Daily PRN Eston EstersAhmad Hamad, MD      . baclofen (LIORESAL) tablet 10 mg  10 mg Oral TID AC & HS Eston EstersAhmad Hamad, MD   10 mg at 05/13/16 1539  . bisacodyl (DULCOLAX) suppository 10 mg  10 mg Rectal Daily PRN Eston EstersAhmad Hamad, MD      . dextrose 5 % and 0.45% NaCl 1,000 mL with potassium chloride 40 mEq infusion   Intravenous Continuous Tyrone Nineyan B Grunz, MD 100 mL/hr  at 05/13/16 1609    . diphenhydrAMINE (BENADRYL) capsule 25 mg  25 mg Oral Q4H PRN Eston EstersAhmad Hamad, MD      . enoxaparin (LOVENOX) injection 40 mg  40 mg Subcutaneous Q24H Eston EstersAhmad Hamad, MD   40 mg at 05/13/16 1041  . feeding supplement (ENSURE ENLIVE) (ENSURE ENLIVE) liquid 237 mL  237 mL Oral BID BM Eston EstersAhmad Hamad, MD   237 mL at 05/13/16 1043  . ipratropium-albuterol (DUONEB) 0.5-2.5 (3) MG/3ML nebulizer solution 3 mL  3 mL Nebulization TID Eston EstersAhmad Hamad, MD   3 mL at 05/13/16 1338  . linaclotide (LINZESS) capsule 145 mcg  145 mcg Oral Daily PRN Eston EstersAhmad Hamad, MD   145 mcg at 05/12/16  2130  . LORazepam (ATIVAN) tablet 0.5 mg  0.5 mg Oral Q6H PRN Eston EstersAhmad Hamad, MD      . MEDLINE mouth rinse  15 mL Mouth Rinse BID Omair Latif Sheikh, DO      . meropenem (MERREM) 1 g in sodium chloride 0.9 % 100 mL IVPB  1 g Intravenous Q8H Stevphen RochesterJames L Ledford, RPH 200 mL/hr at 05/13/16 1332 1 g at 05/13/16 1332  . multivitamin with minerals tablet 1 tablet  1 tablet Oral Daily Eston EstersAhmad Hamad, MD      . oxybutynin (DITROPAN) tablet 5 mg  5 mg Oral TID Eston EstersAhmad Hamad, MD   5 mg at 05/13/16 1605  . oxyCODONE (Oxy IR/ROXICODONE) immediate release tablet 30 mg  30 mg Oral Q4H PRN Eston EstersAhmad Hamad, MD   30 mg at 05/13/16 1410  . pantoprazole (PROTONIX) EC tablet 40 mg  40 mg Oral Daily Eston EstersAhmad Hamad, MD      . polyethylene glycol (MIRALAX / GLYCOLAX) packet 17 g  17 g Oral BID PRN Eston EstersAhmad Hamad, MD      . polyvinyl alcohol (LIQUIFILM TEARS) 1.4 % ophthalmic solution 1 drop  1 drop Both Eyes QID PRN Eston EstersAhmad Hamad, MD      . potassium chloride 30 mEq in sodium chloride 0.9 % 265 mL (KCL MULTIRUN) IVPB  30 mEq Intravenous BID Tyrone Nineyan B Grunz, MD      . pregabalin (LYRICA) capsule 100 mg  100 mg Oral BID Eston EstersAhmad Hamad, MD   100 mg at 05/13/16 1040  . rOPINIRole (REQUIP) tablet 0.5 mg  0.5 mg Oral QHS Eston EstersAhmad Hamad, MD   0.5 mg at 05/12/16 2130  . simethicone (MYLICON) chewable tablet 80 mg  80 mg Oral Q6H PRN Eston EstersAhmad Hamad, MD         Discharge Medications: Please see discharge summary for a list of discharge medications.  Relevant Imaging Results:  Relevant Lab Results:   Additional Information ss# [id number]  Mearl Latinadia S Nicolas Sisler, LCSWA
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
## 2016-05-14 DIAGNOSIS — K219 Gastro-esophageal reflux disease without esophagitis: Secondary | ICD-10-CM

## 2016-05-14 DIAGNOSIS — M609 Myositis, unspecified: Secondary | ICD-10-CM

## 2016-05-14 DIAGNOSIS — T451X5A Adverse effect of antineoplastic and immunosuppressive drugs, initial encounter: Secondary | ICD-10-CM

## 2016-05-14 DIAGNOSIS — A419 Sepsis, unspecified organism: Principal | ICD-10-CM

## 2016-05-14 DIAGNOSIS — D61810 Antineoplastic chemotherapy induced pancytopenia: Secondary | ICD-10-CM

## 2016-05-14 LAB — CBC WITH DIFFERENTIAL/PLATELET
BASOS ABS: 0 10*3/uL (ref 0.0–0.1)
BASOS PCT: 0 %
EOS ABS: 0.1 10*3/uL (ref 0.0–0.7)
EOS PCT: 2 %
HCT: 22.9 % — ABNORMAL LOW (ref 39.0–52.0)
Hemoglobin: 7.7 g/dL — ABNORMAL LOW (ref 13.0–17.0)
LYMPHS PCT: 26 %
Lymphs Abs: 1.1 10*3/uL (ref 0.7–4.0)
MCH: 24.1 pg — ABNORMAL LOW (ref 26.0–34.0)
MCHC: 33.6 g/dL (ref 30.0–36.0)
MCV: 71.6 fL — AB (ref 78.0–100.0)
MONO ABS: 0.4 10*3/uL (ref 0.1–1.0)
Monocytes Relative: 10 %
Neutro Abs: 2.6 10*3/uL (ref 1.7–7.7)
Neutrophils Relative %: 63 %
PLATELETS: 57 10*3/uL — AB (ref 150–400)
RBC: 3.2 MIL/uL — AB (ref 4.22–5.81)
RDW: 18.1 % — AB (ref 11.5–15.5)
WBC: 4.2 10*3/uL (ref 4.0–10.5)

## 2016-05-14 LAB — BASIC METABOLIC PANEL
ANION GAP: 4 — AB (ref 5–15)
BUN: <5 mg/dL — ABNORMAL LOW (ref 6–20)
CALCIUM: 6.5 mg/dL — AB (ref 8.9–10.3)
CO2: 20 mmol/L — ABNORMAL LOW (ref 22–32)
Chloride: 114 mmol/L — ABNORMAL HIGH (ref 101–111)
Creatinine, Ser: 0.69 mg/dL (ref 0.61–1.24)
GFR calc Af Amer: >60 mL/min (ref >60)
GLUCOSE: 89 mg/dL (ref 65–99)
POTASSIUM: 3.4 mmol/L — AB (ref 3.5–5.1)
SODIUM: 138 mmol/L (ref 135–145)

## 2016-05-14 LAB — URINE CULTURE

## 2016-05-14 LAB — MAGNESIUM: Magnesium: 1.1 mg/dL — ABNORMAL LOW (ref 1.7–2.4)

## 2016-05-14 MED ORDER — KCL IN DEXTROSE-NACL 40-5-0.45 MEQ/L-%-% IV SOLN
INTRAVENOUS | Status: AC
  Administered 2016-05-14: 06:00:00 via INTRAVENOUS
  Filled 2016-05-14 (×3): qty 1000

## 2016-05-14 MED ORDER — POLYETHYLENE GLYCOL 3350 17 G PO PACK
17.0000 g | PACK | Freq: Two times a day (BID) | ORAL | Status: DC
Start: 2016-05-14 — End: 2016-05-16
  Administered 2016-05-14: 17 g via ORAL
  Filled 2016-05-14 (×4): qty 1

## 2016-05-14 MED ORDER — SENNOSIDES-DOCUSATE SODIUM 8.6-50 MG PO TABS
2.0000 | ORAL_TABLET | Freq: Every day | ORAL | Status: DC
  Administered 2016-05-14 – 2016-05-15 (×2): 2 via ORAL
  Filled 2016-05-14 (×3): qty 2

## 2016-05-14 MED ORDER — ONDANSETRON HCL 4 MG/2ML IJ SOLN
4.0000 mg | Freq: Four times a day (QID) | INTRAMUSCULAR | Status: DC | PRN
  Filled 2016-05-14: qty 2

## 2016-05-14 MED ORDER — SODIUM CHLORIDE 0.9 % IV SOLN
6.0000 g | Freq: Once | INTRAVENOUS | Status: AC
  Administered 2016-05-14: 6 g via INTRAVENOUS
  Filled 2016-05-14: qty 12

## 2016-05-14 MED ORDER — FLEET ENEMA 7-19 GM/118ML RE ENEM
1.0000 | ENEMA | Freq: Every day | RECTAL | Status: DC | PRN
  Filled 2016-05-14: qty 1

## 2016-05-14 NOTE — Progress Notes (Signed)
Pharmacy Antibiotic Note  Alan Warner is a 55 y.o. male admitted on 05/11/2016 with sepsis (notes with cellulitis of hip/sacral wounds).  Pharmacy has been consulted for Merrem dosing. Recently discharged for severe sepsis/complicated Pseudomonas UTI.  He was on Zyviox but this has been discontinued. ID is also following- plans to add vancomycin if he decompensates.   -WBC= 4.1, afebrile, CrCl > 100   Plan: -Merrem 1g IV q8h -Trend WBC, temp, renal function  -F/U cultures and narrow anti-biotic regimen as able  Height: 6\' 3"  (190.5 cm) Weight: 178 lb (80.7 kg) IBW/kg (Calculated) : 84.5  Temp (24hrs), Avg:98.1 F (36.7 C), Min:97.6 F (36.4 C), Max:98.5 F (36.9 C)   Recent Labs Lab 05/11/16 2203 05/11/16 2245 05/12/16 0435 05/13/16 0925 05/14/16 0438 05/14/16 0542  WBC 3.0*  --  2.7*  --  4.2  --   CREATININE 1.01  --  0.95 0.83  --  0.69  LATICACIDVEN  --  0.68  --   --   --   --     Estimated Creatinine Clearance: 121.9 mL/min (by C-G formula based on SCr of 0.69 mg/dL).    Allergies  Allergen Reactions  . Levaquin [Levofloxacin In D5w] Other (See Comments)    PER MEDICATION MAR  . Penicillins Rash and Other (See Comments)    PER MEDICATION MAR  . Vancomycin Rash    PER MEDICATION MAR    Antimicrobials this admission:  Meropenem 11/26>> Linezolid 11/27>> 11/28   Dose adjustments this admission:    Microbiology results:  11/26 blood x 2:ngtd 11/26 urine:pseudomonas 20k colonies 11/27 Resp cx: GPR/GNR/GPC 11/28 urine pseudomonas (> 100K). Sens to imipenem  Harland GermanAndrew Olisa Quesnel, Pharm D 05/14/2016 8:32 AM
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
## 2016-05-15 DIAGNOSIS — M25559 Pain in unspecified hip: Secondary | ICD-10-CM

## 2016-05-15 DIAGNOSIS — D61818 Other pancytopenia: Secondary | ICD-10-CM | POA: Diagnosis present

## 2016-05-15 DIAGNOSIS — E876 Hypokalemia: Secondary | ICD-10-CM

## 2016-05-15 LAB — CBC WITH DIFFERENTIAL/PLATELET
Basophils Absolute: 0 10*3/uL (ref 0.0–0.1)
Basophils Relative: 0 %
EOS PCT: 2 %
Eosinophils Absolute: 0.1 10*3/uL (ref 0.0–0.7)
HEMATOCRIT: 24.8 % — AB (ref 39.0–52.0)
Hemoglobin: 8.3 g/dL — ABNORMAL LOW (ref 13.0–17.0)
LYMPHS ABS: 1 10*3/uL (ref 0.7–4.0)
LYMPHS PCT: 24 %
MCH: 24.1 pg — AB (ref 26.0–34.0)
MCHC: 33.5 g/dL (ref 30.0–36.0)
MCV: 71.9 fL — AB (ref 78.0–100.0)
MONO ABS: 0.4 10*3/uL (ref 0.1–1.0)
MONOS PCT: 9 %
NEUTROS ABS: 2.8 10*3/uL (ref 1.7–7.7)
Neutrophils Relative %: 65 %
Platelets: 64 10*3/uL — ABNORMAL LOW (ref 150–400)
RBC: 3.45 MIL/uL — ABNORMAL LOW (ref 4.22–5.81)
RDW: 18.4 % — AB (ref 11.5–15.5)
WBC: 4.3 10*3/uL (ref 4.0–10.5)

## 2016-05-15 LAB — BASIC METABOLIC PANEL
ANION GAP: 6 (ref 5–15)
CHLORIDE: 112 mmol/L — AB (ref 101–111)
CO2: 21 mmol/L — AB (ref 22–32)
Calcium: 7.3 mg/dL — ABNORMAL LOW (ref 8.9–10.3)
Creatinine, Ser: 0.65 mg/dL (ref 0.61–1.24)
GFR calc Af Amer: >60 mL/min (ref >60)
GFR calc non Af Amer: >60 mL/min (ref >60)
GLUCOSE: 120 mg/dL — AB (ref 65–99)
POTASSIUM: 4 mmol/L (ref 3.5–5.1)
Sodium: 139 mmol/L (ref 135–145)

## 2016-05-15 LAB — CULTURE, RESPIRATORY: CULTURE: NORMAL

## 2016-05-15 LAB — CULTURE, RESPIRATORY W GRAM STAIN

## 2016-05-15 LAB — MAGNESIUM: Magnesium: 1.8 mg/dL (ref 1.7–2.4)

## 2016-05-15 MED ORDER — MORPHINE SULFATE (PF) 2 MG/ML IV SOLN: 2.0000 mg | Freq: Once | INTRAVENOUS | Status: DC

## 2016-05-15 MED ORDER — KETOROLAC TROMETHAMINE 30 MG/ML IJ SOLN
30.0000 mg | Freq: Once | INTRAMUSCULAR | Status: AC
  Administered 2016-05-16: 30 mg via INTRAVENOUS
  Filled 2016-05-15: qty 1

## 2016-05-15 MED ORDER — FLEET ENEMA 7-19 GM/118ML RE ENEM
1.0000 | ENEMA | Freq: Once | RECTAL | Status: AC
  Administered 2016-05-15: 1 via RECTAL
  Filled 2016-05-15: qty 1

## 2016-05-15 MED ORDER — METOCLOPRAMIDE HCL 5 MG/ML IJ SOLN
10.0000 mg | Freq: Once | INTRAMUSCULAR | Status: AC
  Administered 2016-05-16: 10 mg via INTRAVENOUS
  Filled 2016-05-15: qty 2

## 2016-05-15 MED ORDER — DIPHENHYDRAMINE HCL 50 MG/ML IJ SOLN
25.0000 mg | Freq: Once | INTRAMUSCULAR | Status: AC
  Administered 2016-05-16: 25 mg via INTRAVENOUS
  Filled 2016-05-15: qty 1

## 2016-05-15 MED ORDER — METRONIDAZOLE 500 MG PO TABS
500.0000 mg | ORAL_TABLET | Freq: Three times a day (TID) | ORAL | Status: DC
  Administered 2016-05-16: 500 mg via ORAL
  Filled 2016-05-15: qty 1

## 2016-05-15 MED ORDER — HYDRALAZINE HCL 20 MG/ML IJ SOLN
10.0000 mg | Freq: Once | INTRAMUSCULAR | Status: AC
  Administered 2016-05-16: 10 mg via INTRAVENOUS
  Filled 2016-05-15: qty 1

## 2016-05-15 MED ORDER — DEXTROSE 5 % IV SOLN
2.0000 g | Freq: Three times a day (TID) | INTRAVENOUS | Status: DC
  Administered 2016-05-15 – 2016-05-16 (×2): 2 g via INTRAVENOUS
  Filled 2016-05-15 (×4): qty 2

## 2016-05-15 NOTE — Progress Notes (Addendum)
  [facility]    Date of Admission:  05/11/2016   Total days of antibiotics 5        Day 5 meropenem          ID: Alan Warner is a 55 y.o. male with paraplegia admitted for sepsis thought to be associated with chronic sacral wounds, sacral osteo Active Problems:   Cough   Paraplegia (HCC)   Chronic pain   Sacral decubitus ulcer   Anemia, chronic disease   Hyperglycemia   Hypotension   Hip pain   Other pancytopenia (HCC)    Subjective: Complains of chronic hip pain. No fevers  Leukopenia improving  He has been seen by wound nurse who measured the following lesions: Stage 3 Pressure Injury: left ischial; 0.5cm x 0.5cm x 0.1cm Stage 4 Pressure Injury: right ischial; 9cm x 1.5cm x 0.2cm  Stage 4 Pressure Injury: sacrum; 3.5cm x 2.5cm x 1.0cm   Medications:  . baclofen  10 mg Oral TID AC & HS  . feeding supplement (ENSURE ENLIVE)  237 mL Oral BID BM  . ipratropium-albuterol  3 mL Nebulization TID  . mouth rinse  15 mL Mouth Rinse BID  . meropenem (MERREM) IV  1 g Intravenous Q8H  . multivitamin with minerals  1 tablet Oral Daily  . nystatin  5 mL Oral QID  . oxybutynin  5 mg Oral TID  . pantoprazole  40 mg Oral Daily  . polyethylene glycol  17 g Oral BID  . pregabalin  100 mg Oral BID  . rOPINIRole  0.5 mg Oral QHS  . senna-docusate  2 tablet Oral Daily    Objective: Vital signs in last 24 hours: Temp:  [97.6 F (36.4 C)-98.4 F (36.9 C)] 97.6 F (36.4 C) (11/30 1524) Pulse Rate:  [55-68] 68 (11/30 1524) Resp:  [12-19] 17 (11/30 1524) BP: (93-160)/(63-126) 138/91 (11/30 1400) SpO2:  [96 %-100 %] 96 % (11/30 1524) Constitutional: He is oriented to person, place, and time. He appears well-developed and well-nourished. No distress.  HENT:  Mouth/Throat: Oropharynx is clear and moist. No oropharyngeal exudate.  Cardiovascular: Normal rate, regular rhythm and normal heart sounds. Exam reveals no gallop and no friction rub.  No murmur  heard.  Pulmonary/Chest: Effort normal and breath sounds normal. No respiratory distress. He has no wheezes.  Abdominal: Soft. Bowel sounds are normal. He exhibits no distension. There is no tenderness.  Lymphadenopathy:  He has no cervical adenopathy.  Neurological: He is alert and oriented to person, place, and time.  Skin: Skin is warm and dry. No rash noted. No erythema.  Psychiatric: He has a normal mood and affect. His behavior is normal.      Lab Results  Recent Labs  05/14/16 0438 05/14/16 0542 05/15/16 0600 05/15/16 0954  WBC 4.2  --  4.3  --   HGB 7.7*  --  8.3*  --   HCT 22.9*  --  24.8*  --   NA  --  138  --  139  K  --  3.4*  --  4.0  CL  --  114*  --  112*  CO2  --  20*  --  21*  BUN  --  <5*  --  <5*  CREATININE  --  0.69  --  0.65   Lab Results  Component Value Date   ESRSEDRATE >140 (H) 04/25/2016    Microbiology: Blood cx ngtd  urine cx PsA - pan sensitive Studies/Results:   No results found.  Mri FINDINGS: Deep chronic sacral decubitus ulcer. Chronic probable surgical resection of the lower sacral segments. Decubitus ulcers also involving the ischial regions bilaterally. Both of these extend right down to the bone and there is underlying osteomyelitis. The right ischial tuberosity is chronically eroded or partially resected.  Chronic right hip dislocation and resection of the femoral head. Signal abnormality in the trochanteric region could be chronic reactive change or osteomyelitis.  Diffuse cellulitis and myofasciitis involving the hip and pelvic musculature. No discrete drainable soft tissue abscess.  Chronic markedly distended stool-filled rectum. Suprapubic catheter noted in the bladder.  IMPRESSION: 1. Chronic decubitus ulcers and chronic osteomyelitis. 2. Diffuse myofasciitis and cellulitis without discrete drainable abscess. 3. Chronic rectosigmoid stool distended colon. Assessment/Plan: Chronic sacral decub/osteo =  recommend to treat for 4 wk with  cefepime. Currently on day 5 of 28 days. We will switch his abtx. His pseudomonas isolates from urine have been sensitive. No need for MRSA coverage presently it is negative screen. Recommend to continue with wound care.  Can have him come back to clinic in 4 wk for follow up  Will sign off. ----------------------------------------------------------  Home health orders listed below  Diagnosis: Chronic osteo of pelvis  Culture Result: none  Allergies  Allergen Reactions  . Levaquin [Levofloxacin In D5w] Other (See Comments)    PER MEDICATION MAR  . Penicillins Rash and Other (See Comments)    PER MEDICATION MAR  . Vancomycin Other (See Comments)    GI side effects, probably OK to use.     Discharge antibiotics: Per pharmacy protocol cefepime (plus oral metronidazole)  Duration: 4 End Date: dec 24th  PIC Care Per Protocol:  Labs weekly while on IV antibiotics: __x CBC with differential _x_ BMP __ CMP _x_ CRP _x_ ESR __ Vancomycin trough  __x Please pull PIC at completion of IV antibiotics   Fax weekly labs to [phone]  Clinic Follow Up Appt:  in 4 wk  @ [facility]   ,  [facility] Cell: [phone] Pager: [phone]  05/15/2016, 6:34 PM      
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
## 2016-05-15 NOTE — Progress Notes (Signed)
Pharmacy Antibiotic Note  Alan Warner is a 55 y.o. male admitted on 05/11/2016 with sepsis (notes with cellulitis of hip/sacral wounds).  Pharmacy has been consulted for Merrem dosing. Recently discharged for severe sepsis/complicated Pseudomonas UTI.  He was on Zyvox but this has been discontinued. ID is also following- plans to add vancomycin if he decompensates.  -WBC= 4.3, afebrile, CrCl > 100 -platelet count= 63 (noted 229 on 11/10; nadir was 50 on 11/27). 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 Antibiotics started after the platelet fall started.  Plan: -Merrem 1g IV q8h -Trend WBC, temp, renal function  -F/U cultures and narrow anti-biotic regimen as able  Height: 6\' 3"  (190.5 cm) Weight: 178 lb (80.7 kg) IBW/kg (Calculated) : 84.5  Temp (24hrs), Avg:98.2 F (36.8 C), Min:97.9 F (36.6 C), Max:98.4 F (36.9 C)   Recent Labs Lab 05/11/16 2203 05/11/16 2245 05/12/16 0435 05/13/16 0925 05/14/16 0438 05/14/16 0542 05/15/16 0600  WBC 3.0*  --  2.7*  --  4.2  --  4.3  CREATININE 1.01  --  0.95 0.83  --  0.69  --   LATICACIDVEN  --  0.68  --   --   --   --   --     Estimated Creatinine Clearance: 121.9 mL/min (by C-G formula based on SCr of 0.69 mg/dL).    Allergies  Allergen Reactions  . Levaquin [Levofloxacin In D5w] Other (See Comments)    PER MEDICATION MAR  . Penicillins Rash and Other (See Comments)    PER MEDICATION MAR  . Vancomycin Other (See Comments)    GI side effects, probably OK to use.     Antimicrobials this admission:  Meropenem 11/26>> Linezolid 11/27>> 11/28   Dose adjustments this admission:    Microbiology results:  11/26 blood x 2:ngtd 11/26 urine:pseudomonas 20k colonies 11/27 Resp cx: GPR/GNR/GPC 11/28 urine pseudomonas (> 100K). Sens to imipenem  Alan Warner, Pharm D 05/15/2016 8:40 AM

## 2016-05-15 NOTE — Progress Notes (Signed)
ID contacted on wether to place pt on Contact for MRSA, at this time recommendation is to NOT place on Contact.

## 2016-05-15 NOTE — Progress Notes (Signed)
Pt PICC line dsg documentation not clear, appears dsg is due to be changed as of 05/11/16, however some documentation states 12/06. Pt states does not want dsg  changed now due to being cold and would let the next nurse change the dsg. This RN will update on coming shift

## 2016-05-16 DIAGNOSIS — L89154 Pressure ulcer of sacral region, stage 4: Secondary | ICD-10-CM

## 2016-05-16 LAB — CBC WITH DIFFERENTIAL/PLATELET
Basophils Absolute: 0 10*3/uL (ref 0.0–0.1)
Basophils Relative: 0 %
EOS ABS: 0.1 10*3/uL (ref 0.0–0.7)
EOS PCT: 2 %
HCT: 25.1 % — ABNORMAL LOW (ref 39.0–52.0)
Hemoglobin: 8.3 g/dL — ABNORMAL LOW (ref 13.0–17.0)
LYMPHS ABS: 1 10*3/uL (ref 0.7–4.0)
LYMPHS PCT: 25 %
MCH: 23.8 pg — AB (ref 26.0–34.0)
MCHC: 33.1 g/dL (ref 30.0–36.0)
MCV: 71.9 fL — AB (ref 78.0–100.0)
MONO ABS: 0.4 10*3/uL (ref 0.1–1.0)
MONOS PCT: 9 %
Neutro Abs: 2.6 10*3/uL (ref 1.7–7.7)
Neutrophils Relative %: 64 %
PLATELETS: 71 10*3/uL — AB (ref 150–400)
RBC: 3.49 MIL/uL — ABNORMAL LOW (ref 4.22–5.81)
RDW: 18.7 % — AB (ref 11.5–15.5)
WBC: 4.1 10*3/uL (ref 4.0–10.5)

## 2016-05-16 LAB — BASIC METABOLIC PANEL
Anion gap: 6 (ref 5–15)
CHLORIDE: 111 mmol/L (ref 101–111)
CO2: 22 mmol/L (ref 22–32)
CREATININE: 0.67 mg/dL (ref 0.61–1.24)
Calcium: 7.3 mg/dL — ABNORMAL LOW (ref 8.9–10.3)
GFR calc Af Amer: >60 mL/min (ref >60)
GFR calc non Af Amer: >60 mL/min (ref >60)
GLUCOSE: 80 mg/dL (ref 65–99)
POTASSIUM: 3.1 mmol/L — AB (ref 3.5–5.1)
SODIUM: 139 mmol/L (ref 135–145)

## 2016-05-16 LAB — CULTURE, BLOOD (ROUTINE X 2): CULTURE: NO GROWTH

## 2016-05-16 LAB — MAGNESIUM: MAGNESIUM: 1.5 mg/dL — AB (ref 1.7–2.4)

## 2016-05-16 MED ORDER — OXYCODONE HCL 30 MG PO TABS: 30.0000 mg | ORAL_TABLET | ORAL | 0 refills | Status: DC | PRN

## 2016-05-16 MED ORDER — CEFEPIME IV (FOR PTA / DISCHARGE USE ONLY)
2.0000 g | Freq: Three times a day (TID) | INTRAVENOUS | 0 refills | Status: DC
Start: 2016-05-16 — End: 2016-05-29

## 2016-05-16 MED ORDER — SENNOSIDES-DOCUSATE SODIUM 8.6-50 MG PO TABS: 2.0000 | ORAL_TABLET | Freq: Every day | ORAL | Status: AC

## 2016-05-16 MED ORDER — POTASSIUM CHLORIDE CRYS ER 20 MEQ PO TBCR
40.0000 meq | EXTENDED_RELEASE_TABLET | ORAL | Status: AC
  Administered 2016-05-16 (×2): 40 meq via ORAL
  Filled 2016-05-16 (×2): qty 2

## 2016-05-16 MED ORDER — MAGNESIUM SULFATE 2 GM/50ML IV SOLN
2.0000 g | Freq: Once | INTRAVENOUS | Status: AC
  Administered 2016-05-16: 2 g via INTRAVENOUS
  Filled 2016-05-16: qty 50

## 2016-05-16 MED ORDER — FLEET ENEMA 7-19 GM/118ML RE ENEM: 1.0000 | ENEMA | Freq: Every day | RECTAL | 0 refills | Status: AC | PRN

## 2016-05-16 MED ORDER — DEXTROSE 5 % IV SOLN: 2.0000 g | Freq: Three times a day (TID) | INTRAVENOUS | Status: DC

## 2016-05-16 MED ORDER — POTASSIUM CHLORIDE CRYS ER 20 MEQ PO TBCR: 20.0000 meq | EXTENDED_RELEASE_TABLET | Freq: Every day | ORAL | Status: AC

## 2016-05-16 MED ORDER — LORAZEPAM 0.5 MG PO TABS: 0.5000 mg | ORAL_TABLET | Freq: Four times a day (QID) | ORAL | 0 refills | Status: DC | PRN

## 2016-05-16 MED ORDER — POLYETHYLENE GLYCOL 3350 17 G PO PACK: 17.0000 g | PACK | Freq: Every day | ORAL | 0 refills | Status: AC

## 2016-05-16 MED ORDER — METRONIDAZOLE 500 MG PO TABS: 500.0000 mg | ORAL_TABLET | Freq: Three times a day (TID) | ORAL | Status: DC

## 2016-05-16 NOTE — Care Management Important Message (Signed)
Important Message  Patient Details  Name: Alan Warner MRN: [id number] Date of Birth: 1961-06-08   Medicare Important Message Given:  Yes    Kyla BalzarineShealy, Linsy Ehresman Abena 05/16/2016, 1:02 PM

## 2016-05-16 NOTE — Discharge Summary (Addendum)
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 Estimated Creatinine Clearance: 121.9 mL/min (by C-G formula based on SCr of 0.67 mg/dL). No results for input(s): LIPASE, AMYLASE in the last 72 hours. No results for input(s): CKTOTAL, CKMB, CKMBINDEX, TROPONINI in the last 72 hours. Invalid input(s): POCBNP No results for input(s): DDIMER in the last 72 hours. No results for input(s): HGBA1C in the last 72 hours. No results for input(s): CHOL, HDL, LDLCALC, TRIG, CHOLHDL, LDLDIRECT in the last 72 hours. No results for input(s): TSH, T4TOTAL, T3FREE, THYROIDAB in the last 72 hours.  Invalid input(s): FREET3 No results for input(s): VITAMINB12, FOLATE, FERRITIN, TIBC, IRON, RETICCTPCT in the last 72 hours. Coags: No results for input(s): INR in the last 72 hours.  Invalid input(s): PT Microbiology: Recent Results (from the past 240 hour(s))  Blood Culture (routine x 2)     Status: None   Collection  Time: 05/11/16 10:03 PM  Result Value Ref Range Status   Specimen Description BLOOD RIGHT HAND  Final  Special Requests BOTTLES DRAWN AEROBIC AND ANAEROBIC 5CC  Final   Culture NO GROWTH 5 DAYS  Final   Report Status 05/16/2016 FINAL  Final  Urine culture     Status: Abnormal   Collection Time: 05/11/16 10:03 PM  Result Value Ref Range Status   Specimen Description URINE, RANDOM  Final   Special Requests NONE  Final   Culture 20,000 COLONIES/mL PSEUDOMONAS AERUGINOSA (A)  Final   Report Status 05/14/2016 FINAL  Final   Organism ID, Bacteria PSEUDOMONAS AERUGINOSA (A)  Final      Susceptibility   Pseudomonas aeruginosa - MIC*    CEFTAZIDIME 4 SENSITIVE Sensitive     CIPROFLOXACIN 0.5 SENSITIVE Sensitive     GENTAMICIN 4 SENSITIVE Sensitive     IMIPENEM 2 SENSITIVE Sensitive     CEFEPIME 4 SENSITIVE Sensitive     * 20,000 COLONIES/mL PSEUDOMONAS AERUGINOSA  Blood Culture (routine x 2)     Status: None (Preliminary result)   Collection Time: 05/11/16 10:54 PM  Result Value Ref Range Status   Specimen Description BLOOD RIGHT PICC LINE  Final   Special Requests BOTTLES DRAWN AEROBIC AND ANAEROBIC 5CC  Final   Culture NO GROWTH 4 DAYS  Final   Report Status PENDING  Incomplete  Culture, expectorated sputum-assessment     Status: None   Collection Time: 05/12/16 10:35 PM  Result Value Ref Range Status   Specimen Description EXPECTORATED SPUTUM  Final   Special Requests NONE  Final   Sputum evaluation   Final    THIS SPECIMEN IS ACCEPTABLE. RESPIRATORY CULTURE REPORT TO FOLLOW.   Report Status 05/13/2016 FINAL  Final  Culture, respiratory (NON-Expectorated)     Status: None   Collection Time: 05/12/16 10:35 PM  Result Value Ref Range Status   Specimen Description EXPECTORATED SPUTUM  Final   Special Requests NONE  Final   Gram Stain   Final    ABUNDANT WBC PRESENT, PREDOMINANTLY PMN ABUNDANT SQUAMOUS EPITHELIAL CELLS PRESENT FEW YEAST FEW GRAM POSITIVE RODS RARE GRAM NEGATIVE  RODS RARE GRAM POSITIVE COCCI    Culture Consistent with normal respiratory flora.  Final   Report Status 05/15/2016 FINAL  Final    FURTHER DISCHARGE INSTRUCTIONS:  Get Medicines reviewed and adjusted: Please take all your medications with you for your next visit with your Primary MD  Laboratory/radiological data: Please request your Primary MD to go over all hospital tests and procedure/radiological results at the follow up, please ask your Primary MD to get all Hospital records sent to his/her office.  In some cases, they will be blood work, cultures and biopsy results pending at the time of your discharge. Please request that your primary care M.D. goes through all the records of your hospital data and follows up on these results.  Also Note the following: If you experience worsening of your admission symptoms, develop shortness of breath, life threatening emergency, suicidal or homicidal thoughts you must seek medical attention immediately by calling 911 or calling your MD immediately  if symptoms less severe.  You must read complete instructions/literature along with all the possible adverse reactions/side effects for all the Medicines you take and that have been prescribed to you. Take any new Medicines after you have completely understood and accpet all the possible adverse reactions/side effects.   Do not drive when taking Pain medications or sleeping medications (Benzodaizepines)  Do not take more than prescribed Pain, Sleep and Anxiety Medications. It is not advisable to combine anxiety,sleep and pain medications  without talking with your primary care practitioner  Special Instructions: If you have smoked or chewed Tobacco  in the last 2 yrs please stop smoking, stop any regular Alcohol  and or any Recreational drug use.  Wear Seat belts while driving.  Please note: You were cared for by a hospitalist during your hospital stay. Once you are discharged, your primary care  physician will handle any further medical issues. Please note that NO REFILLS for any discharge medications will be authorized once you are discharged, as it is imperative that you return to your primary care physician (or establish a relationship with a primary care physician if you do not have one) for your post hospital discharge needs so that they can reassess your need for medications and monitor your lab values.  Total Time spent coordinating discharge including counseling, education and face to face time equals  45 minutes.  SignedOren Binet 05/16/2016 11:53 AM

## 2016-05-16 NOTE — Progress Notes (Signed)
Patient will DC to: [facility] Anticipated DC date: 05/16/16 Family notified: Pt notifying family Transport by: Sharin MonsPTAR   Per MD [facility]. RN, patient, patient's family, and facility notified of DC. Discharge Summary sent to facility. RN given number for report. DC packet on chart. Ambulance transport requested for patient.   CSW signing off.  Cristobal GoldmannNadia Varnell Donate, [place] Clinical Social Worker [phone]

## 2016-05-17 LAB — CULTURE, BLOOD (ROUTINE X 2): CULTURE: NO GROWTH

## 2016-05-20 ENCOUNTER — Encounter (HOSPITAL_COMMUNITY): Payer: Self-pay

## 2016-05-20 ENCOUNTER — Inpatient Hospital Stay (HOSPITAL_COMMUNITY)
Admission: EM | Admit: 2016-05-20 | Discharge: 2016-05-29 | DRG: 871 | Disposition: A | Discharge status: Skilled Nursing Facility | Payer: Medicare Other | Attending: Internal Medicine | Admitting: Internal Medicine

## 2016-05-20 ENCOUNTER — Emergency Department (HOSPITAL_COMMUNITY): Payer: Medicare Other

## 2016-05-20 DIAGNOSIS — R509 Fever, unspecified: Secondary | ICD-10-CM | POA: Diagnosis not present

## 2016-05-20 DIAGNOSIS — D62 Acute posthemorrhagic anemia: Secondary | ICD-10-CM | POA: Diagnosis present

## 2016-05-20 DIAGNOSIS — K5641 Fecal impaction: Secondary | ICD-10-CM | POA: Diagnosis present

## 2016-05-20 DIAGNOSIS — G8929 Other chronic pain: Secondary | ICD-10-CM | POA: Diagnosis present

## 2016-05-20 DIAGNOSIS — R05 Cough: Secondary | ICD-10-CM

## 2016-05-20 DIAGNOSIS — B965 Pseudomonas (aeruginosa) (mallei) (pseudomallei) as the cause of diseases classified elsewhere: Secondary | ICD-10-CM | POA: Diagnosis present

## 2016-05-20 DIAGNOSIS — G9340 Encephalopathy, unspecified: Secondary | ICD-10-CM | POA: Diagnosis present

## 2016-05-20 DIAGNOSIS — R058 Other specified cough: Secondary | ICD-10-CM

## 2016-05-20 DIAGNOSIS — K130 Diseases of lips: Secondary | ICD-10-CM | POA: Diagnosis present

## 2016-05-20 DIAGNOSIS — R6520 Severe sepsis without septic shock: Secondary | ICD-10-CM | POA: Diagnosis present

## 2016-05-20 DIAGNOSIS — A419 Sepsis, unspecified organism: Secondary | ICD-10-CM | POA: Diagnosis not present

## 2016-05-20 DIAGNOSIS — L89214 Pressure ulcer of right hip, stage 4: Secondary | ICD-10-CM | POA: Diagnosis present

## 2016-05-20 DIAGNOSIS — G8220 Paraplegia, unspecified: Secondary | ICD-10-CM | POA: Diagnosis present

## 2016-05-20 DIAGNOSIS — R6521 Severe sepsis with septic shock: Secondary | ICD-10-CM | POA: Diagnosis present

## 2016-05-20 DIAGNOSIS — F17210 Nicotine dependence, cigarettes, uncomplicated: Secondary | ICD-10-CM | POA: Diagnosis present

## 2016-05-20 DIAGNOSIS — L89154 Pressure ulcer of sacral region, stage 4: Secondary | ICD-10-CM | POA: Diagnosis present

## 2016-05-20 DIAGNOSIS — B369 Superficial mycosis, unspecified: Secondary | ICD-10-CM | POA: Diagnosis present

## 2016-05-20 DIAGNOSIS — J9811 Atelectasis: Secondary | ICD-10-CM | POA: Diagnosis present

## 2016-05-20 DIAGNOSIS — M8660 Other chronic osteomyelitis, unspecified site: Secondary | ICD-10-CM | POA: Diagnosis present

## 2016-05-20 DIAGNOSIS — Z79899 Other long term (current) drug therapy: Secondary | ICD-10-CM

## 2016-05-20 DIAGNOSIS — F1120 Opioid dependence, uncomplicated: Secondary | ICD-10-CM | POA: Diagnosis present

## 2016-05-20 DIAGNOSIS — T83511A Infection and inflammatory reaction due to indwelling urethral catheter, initial encounter: Secondary | ICD-10-CM

## 2016-05-20 DIAGNOSIS — K922 Gastrointestinal hemorrhage, unspecified: Secondary | ICD-10-CM | POA: Diagnosis present

## 2016-05-20 DIAGNOSIS — N319 Neuromuscular dysfunction of bladder, unspecified: Secondary | ICD-10-CM | POA: Diagnosis present

## 2016-05-20 DIAGNOSIS — L8990 Pressure ulcer of unspecified site, unspecified stage: Secondary | ICD-10-CM | POA: Insufficient documentation

## 2016-05-20 DIAGNOSIS — L89304 Pressure ulcer of unspecified buttock, stage 4: Secondary | ICD-10-CM

## 2016-05-20 DIAGNOSIS — N390 Urinary tract infection, site not specified: Secondary | ICD-10-CM | POA: Diagnosis present

## 2016-05-20 DIAGNOSIS — K567 Ileus, unspecified: Secondary | ICD-10-CM | POA: Diagnosis present

## 2016-05-20 DIAGNOSIS — Z87440 Personal history of urinary (tract) infections: Secondary | ICD-10-CM

## 2016-05-20 DIAGNOSIS — D696 Thrombocytopenia, unspecified: Secondary | ICD-10-CM | POA: Diagnosis present

## 2016-05-20 DIAGNOSIS — E876 Hypokalemia: Secondary | ICD-10-CM

## 2016-05-20 DIAGNOSIS — D638 Anemia in other chronic diseases classified elsewhere: Secondary | ICD-10-CM | POA: Diagnosis present

## 2016-05-20 LAB — I-STAT CG4 LACTIC ACID, ED: LACTIC ACID, VENOUS: 1.54 mmol/L (ref 0.5–1.9)

## 2016-05-20 MED ORDER — SODIUM CHLORIDE 0.9 % IV BOLUS (SEPSIS)
1000.0000 mL | Freq: Once | INTRAVENOUS | Status: AC
  Administered 2016-05-20: 1000 mL via INTRAVENOUS

## 2016-05-20 MED ORDER — SODIUM CHLORIDE 0.9 % IV BOLUS (SEPSIS)
250.0000 mL | Freq: Once | INTRAVENOUS | Status: AC
  Administered 2016-05-21: 250 mL via INTRAVENOUS

## 2016-05-20 NOTE — ED Notes (Signed)
Pt had to be stimulated to wake up, able to answer questions by nodding but will not verbalize answers
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
## 2016-05-20 NOTE — ED Triage Notes (Signed)
Patient transported via [facility] from facility. Reports recent admission/discharge for sepsis. Patient reports to EMS a continuation of symptoms (lethargy, hypotension, productive cough, sweating).

## 2016-05-21 DIAGNOSIS — K5641 Fecal impaction: Secondary | ICD-10-CM | POA: Diagnosis present

## 2016-05-21 DIAGNOSIS — L89153 Pressure ulcer of sacral region, stage 3: Secondary | ICD-10-CM | POA: Diagnosis not present

## 2016-05-21 DIAGNOSIS — K922 Gastrointestinal hemorrhage, unspecified: Secondary | ICD-10-CM | POA: Diagnosis present

## 2016-05-21 DIAGNOSIS — G8929 Other chronic pain: Secondary | ICD-10-CM | POA: Diagnosis present

## 2016-05-21 DIAGNOSIS — R6520 Severe sepsis without septic shock: Secondary | ICD-10-CM | POA: Diagnosis not present

## 2016-05-21 DIAGNOSIS — D638 Anemia in other chronic diseases classified elsewhere: Secondary | ICD-10-CM | POA: Diagnosis not present

## 2016-05-21 DIAGNOSIS — L8990 Pressure ulcer of unspecified site, unspecified stage: Secondary | ICD-10-CM | POA: Insufficient documentation

## 2016-05-21 DIAGNOSIS — I959 Hypotension, unspecified: Secondary | ICD-10-CM | POA: Diagnosis not present

## 2016-05-21 DIAGNOSIS — K5909 Other constipation: Secondary | ICD-10-CM | POA: Diagnosis not present

## 2016-05-21 DIAGNOSIS — R509 Fever, unspecified: Secondary | ICD-10-CM | POA: Diagnosis present

## 2016-05-21 DIAGNOSIS — D696 Thrombocytopenia, unspecified: Secondary | ICD-10-CM | POA: Diagnosis not present

## 2016-05-21 DIAGNOSIS — K567 Ileus, unspecified: Secondary | ICD-10-CM | POA: Diagnosis present

## 2016-05-21 DIAGNOSIS — L89319 Pressure ulcer of right buttock, unspecified stage: Secondary | ICD-10-CM

## 2016-05-21 DIAGNOSIS — K130 Diseases of lips: Secondary | ICD-10-CM | POA: Diagnosis present

## 2016-05-21 DIAGNOSIS — F17210 Nicotine dependence, cigarettes, uncomplicated: Secondary | ICD-10-CM

## 2016-05-21 DIAGNOSIS — G8220 Paraplegia, unspecified: Secondary | ICD-10-CM

## 2016-05-21 DIAGNOSIS — N390 Urinary tract infection, site not specified: Secondary | ICD-10-CM | POA: Diagnosis present

## 2016-05-21 DIAGNOSIS — A419 Sepsis, unspecified organism: Secondary | ICD-10-CM | POA: Diagnosis present

## 2016-05-21 DIAGNOSIS — R6521 Severe sepsis with septic shock: Secondary | ICD-10-CM | POA: Diagnosis not present

## 2016-05-21 DIAGNOSIS — Z87440 Personal history of urinary (tract) infections: Secondary | ICD-10-CM

## 2016-05-21 DIAGNOSIS — Z880 Allergy status to penicillin: Secondary | ICD-10-CM

## 2016-05-21 DIAGNOSIS — F1120 Opioid dependence, uncomplicated: Secondary | ICD-10-CM | POA: Diagnosis present

## 2016-05-21 DIAGNOSIS — Z881 Allergy status to other antibiotic agents status: Secondary | ICD-10-CM

## 2016-05-21 DIAGNOSIS — N319 Neuromuscular dysfunction of bladder, unspecified: Secondary | ICD-10-CM | POA: Diagnosis present

## 2016-05-21 DIAGNOSIS — L89304 Pressure ulcer of unspecified buttock, stage 4: Secondary | ICD-10-CM | POA: Diagnosis present

## 2016-05-21 DIAGNOSIS — G9340 Encephalopathy, unspecified: Secondary | ICD-10-CM | POA: Diagnosis present

## 2016-05-21 DIAGNOSIS — L89219 Pressure ulcer of right hip, unspecified stage: Secondary | ICD-10-CM

## 2016-05-21 DIAGNOSIS — B965 Pseudomonas (aeruginosa) (mallei) (pseudomallei) as the cause of diseases classified elsewhere: Secondary | ICD-10-CM | POA: Diagnosis present

## 2016-05-21 DIAGNOSIS — L89329 Pressure ulcer of left buttock, unspecified stage: Secondary | ICD-10-CM | POA: Diagnosis not present

## 2016-05-21 DIAGNOSIS — Z960 Presence of urogenital implants: Secondary | ICD-10-CM

## 2016-05-21 DIAGNOSIS — L89159 Pressure ulcer of sacral region, unspecified stage: Secondary | ICD-10-CM

## 2016-05-21 DIAGNOSIS — Z95828 Presence of other vascular implants and grafts: Secondary | ICD-10-CM

## 2016-05-21 DIAGNOSIS — M8660 Other chronic osteomyelitis, unspecified site: Secondary | ICD-10-CM | POA: Diagnosis present

## 2016-05-21 DIAGNOSIS — J9811 Atelectasis: Secondary | ICD-10-CM | POA: Diagnosis present

## 2016-05-21 DIAGNOSIS — D62 Acute posthemorrhagic anemia: Secondary | ICD-10-CM | POA: Diagnosis not present

## 2016-05-21 DIAGNOSIS — E876 Hypokalemia: Secondary | ICD-10-CM | POA: Diagnosis not present

## 2016-05-21 DIAGNOSIS — L89154 Pressure ulcer of sacral region, stage 4: Secondary | ICD-10-CM | POA: Diagnosis present

## 2016-05-21 DIAGNOSIS — L89214 Pressure ulcer of right hip, stage 4: Secondary | ICD-10-CM | POA: Diagnosis present

## 2016-05-21 DIAGNOSIS — M4628 Osteomyelitis of vertebra, sacral and sacrococcygeal region: Secondary | ICD-10-CM | POA: Diagnosis not present

## 2016-05-21 DIAGNOSIS — B369 Superficial mycosis, unspecified: Secondary | ICD-10-CM | POA: Diagnosis present

## 2016-05-21 LAB — COMPREHENSIVE METABOLIC PANEL
ALT: 15 U/L — AB (ref 17–63)
ANION GAP: 6 (ref 5–15)
AST: 46 U/L — ABNORMAL HIGH (ref 15–41)
Albumin: 1.4 g/dL — ABNORMAL LOW (ref 3.5–5.0)
Alkaline Phosphatase: 190 U/L — ABNORMAL HIGH (ref 38–126)
BUN: 10 mg/dL (ref 6–20)
CHLORIDE: 110 mmol/L (ref 101–111)
CO2: 23 mmol/L (ref 22–32)
Calcium: 7.8 mg/dL — ABNORMAL LOW (ref 8.9–10.3)
Creatinine, Ser: 0.95 mg/dL (ref 0.61–1.24)
Glucose, Bld: 133 mg/dL — ABNORMAL HIGH (ref 65–99)
POTASSIUM: 2.8 mmol/L — AB (ref 3.5–5.1)
SODIUM: 139 mmol/L (ref 135–145)
Total Bilirubin: 1.5 mg/dL — ABNORMAL HIGH (ref 0.3–1.2)
Total Protein: 5.9 g/dL — ABNORMAL LOW (ref 6.5–8.1)

## 2016-05-21 LAB — CBC
HCT: 27.2 % — ABNORMAL LOW (ref 39.0–52.0)
HEMOGLOBIN: 9.1 g/dL — AB (ref 13.0–17.0)
MCH: 24.5 pg — AB (ref 26.0–34.0)
MCHC: 33.5 g/dL (ref 30.0–36.0)
MCV: 73.1 fL — ABNORMAL LOW (ref 78.0–100.0)
Platelets: 91 10*3/uL — ABNORMAL LOW (ref 150–400)
RBC: 3.72 MIL/uL — AB (ref 4.22–5.81)
RDW: 20.2 % — ABNORMAL HIGH (ref 11.5–15.5)
WBC: 12.5 10*3/uL — AB (ref 4.0–10.5)

## 2016-05-21 LAB — URINALYSIS, ROUTINE W REFLEX MICROSCOPIC
GLUCOSE, UA: NEGATIVE mg/dL
Ketones, ur: 5 mg/dL — AB
Nitrite: POSITIVE — AB
Specific Gravity, Urine: 1.017 (ref 1.005–1.030)
pH: 6 (ref 5.0–8.0)

## 2016-05-21 LAB — CBC WITH DIFFERENTIAL/PLATELET
Basophils Absolute: 0 10*3/uL (ref 0.0–0.1)
Basophils Relative: 0 %
EOS ABS: 0.1 10*3/uL (ref 0.0–0.7)
Eosinophils Relative: 0 %
HEMATOCRIT: 28.2 % — AB (ref 39.0–52.0)
HEMOGLOBIN: 9.2 g/dL — AB (ref 13.0–17.0)
LYMPHS ABS: 1.1 10*3/uL (ref 0.7–4.0)
LYMPHS PCT: 8 %
MCH: 23.9 pg — AB (ref 26.0–34.0)
MCHC: 32.6 g/dL (ref 30.0–36.0)
MCV: 73.2 fL — AB (ref 78.0–100.0)
MONOS PCT: 6 %
Monocytes Absolute: 0.8 10*3/uL (ref 0.1–1.0)
NEUTROS ABS: 12.5 10*3/uL — AB (ref 1.7–7.7)
NEUTROS PCT: 86 %
Platelets: 91 10*3/uL — ABNORMAL LOW (ref 150–400)
RBC: 3.85 MIL/uL — AB (ref 4.22–5.81)
RDW: 20.4 % — ABNORMAL HIGH (ref 11.5–15.5)
WBC: 14.6 10*3/uL — AB (ref 4.0–10.5)

## 2016-05-21 LAB — BASIC METABOLIC PANEL
ANION GAP: 5 (ref 5–15)
ANION GAP: 6 (ref 5–15)
BUN: 8 mg/dL (ref 6–20)
BUN: 9 mg/dL (ref 6–20)
CHLORIDE: 114 mmol/L — AB (ref 101–111)
CHLORIDE: 113 mmol/L — AB (ref 101–111)
CO2: 22 mmol/L (ref 22–32)
CO2: 20 mmol/L — ABNORMAL LOW (ref 22–32)
Calcium: 7.2 mg/dL — ABNORMAL LOW (ref 8.9–10.3)
Calcium: 7.7 mg/dL — ABNORMAL LOW (ref 8.9–10.3)
Creatinine, Ser: 0.78 mg/dL (ref 0.61–1.24)
Creatinine, Ser: 0.74 mg/dL (ref 0.61–1.24)
GFR calc non Af Amer: >60 mL/min (ref >60)
Glucose, Bld: 122 mg/dL — ABNORMAL HIGH (ref 65–99)
Glucose, Bld: 106 mg/dL — ABNORMAL HIGH (ref 65–99)
POTASSIUM: 3 mmol/L — AB (ref 3.5–5.1)
POTASSIUM: 2.8 mmol/L — AB (ref 3.5–5.1)
SODIUM: 141 mmol/L (ref 135–145)
SODIUM: 139 mmol/L (ref 135–145)

## 2016-05-21 LAB — RAPID URINE DRUG SCREEN, HOSP PERFORMED
AMPHETAMINES: NOT DETECTED
BARBITURATES: NOT DETECTED
BENZODIAZEPINES: NOT DETECTED
Cocaine: NOT DETECTED
Opiates: POSITIVE — AB
Tetrahydrocannabinol: NOT DETECTED

## 2016-05-21 LAB — MRSA PCR SCREENING: MRSA BY PCR: NEGATIVE

## 2016-05-21 LAB — GLUCOSE, CAPILLARY: GLUCOSE-CAPILLARY: 116 mg/dL — AB (ref 65–99)

## 2016-05-21 LAB — PHOSPHORUS: PHOSPHORUS: 1.9 mg/dL — AB (ref 2.5–4.6)

## 2016-05-21 LAB — C-REACTIVE PROTEIN: CRP: 2.3 mg/dL — AB (ref <1.0)

## 2016-05-21 LAB — I-STAT CG4 LACTIC ACID, ED: Lactic Acid, Venous: 0.8 mmol/L (ref 0.5–1.9)

## 2016-05-21 LAB — SEDIMENTATION RATE: SED RATE: 22 mm/h — AB (ref 0–16)

## 2016-05-21 LAB — AMMONIA: Ammonia: 39 umol/L — ABNORMAL HIGH (ref 9–35)

## 2016-05-21 LAB — CORTISOL: CORTISOL PLASMA: 12.9 ug/dL

## 2016-05-21 LAB — MAGNESIUM: MAGNESIUM: 1.2 mg/dL — AB (ref 1.7–2.4)

## 2016-05-21 MED ORDER — VANCOMYCIN HCL IN DEXTROSE 1-5 GM/200ML-% IV SOLN
1000.0000 mg | Freq: Two times a day (BID) | INTRAVENOUS | Status: DC
  Administered 2016-05-21 – 2016-05-22 (×4): 1000 mg via INTRAVENOUS
  Filled 2016-05-21 (×7): qty 200

## 2016-05-21 MED ORDER — DEXTROSE 5 % IV SOLN
2.0000 g | Freq: Three times a day (TID) | INTRAVENOUS | Status: DC
  Administered 2016-05-21: 2 g via INTRAVENOUS
  Filled 2016-05-21 (×3): qty 2

## 2016-05-21 MED ORDER — LACTATED RINGERS IV BOLUS (SEPSIS)
1000.0000 mL | Freq: Once | INTRAVENOUS | Status: AC
  Administered 2016-05-21: 1000 mL via INTRAVENOUS

## 2016-05-21 MED ORDER — FAMOTIDINE IN NACL 20-0.9 MG/50ML-% IV SOLN
20.0000 mg | Freq: Two times a day (BID) | INTRAVENOUS | Status: DC
  Administered 2016-05-21: 20 mg via INTRAVENOUS
  Filled 2016-05-21 (×2): qty 50

## 2016-05-21 MED ORDER — OXYCODONE HCL 5 MG PO TABS
15.0000 mg | ORAL_TABLET | ORAL | Status: DC | PRN
  Administered 2016-05-21 – 2016-05-22 (×5): 15 mg via ORAL
  Filled 2016-05-21 (×5): qty 3

## 2016-05-21 MED ORDER — POTASSIUM CHLORIDE CRYS ER 20 MEQ PO TBCR: 40.0000 meq | EXTENDED_RELEASE_TABLET | Freq: Once | ORAL | Status: DC

## 2016-05-21 MED ORDER — SODIUM CHLORIDE 0.9 % IV SOLN: 250.0000 mL | INTRAVENOUS | Status: DC | PRN

## 2016-05-21 MED ORDER — SODIUM CHLORIDE 0.45 % IV SOLN
INTRAVENOUS | Status: DC
  Administered 2016-05-21 – 2016-05-26 (×4): via INTRAVENOUS

## 2016-05-21 MED ORDER — VANCOMYCIN HCL IN DEXTROSE 1-5 GM/200ML-% IV SOLN
1000.0000 mg | Freq: Once | INTRAVENOUS | Status: AC
  Administered 2016-05-21: 1000 mg via INTRAVENOUS
  Filled 2016-05-21: qty 200

## 2016-05-21 MED ORDER — FLEET ENEMA 7-19 GM/118ML RE ENEM
1.0000 | ENEMA | Freq: Once | RECTAL | Status: DC
  Filled 2016-05-21: qty 1

## 2016-05-21 MED ORDER — MAGNESIUM SULFATE 2 GM/50ML IV SOLN
2.0000 g | Freq: Once | INTRAVENOUS | Status: AC
  Administered 2016-05-21: 2 g via INTRAVENOUS
  Filled 2016-05-21: qty 50

## 2016-05-21 MED ORDER — POTASSIUM CHLORIDE 20 MEQ/15ML (10%) PO SOLN
40.0000 meq | Freq: Three times a day (TID) | ORAL | Status: DC
  Filled 2016-05-21 (×2): qty 30

## 2016-05-21 MED ORDER — ACETAMINOPHEN 325 MG PO TABS
650.0000 mg | ORAL_TABLET | ORAL | Status: DC | PRN
  Filled 2016-05-21: qty 2

## 2016-05-21 MED ORDER — SODIUM CHLORIDE 0.9 % IV SOLN
1.0000 g | Freq: Three times a day (TID) | INTRAVENOUS | Status: DC
  Administered 2016-05-21 – 2016-05-28 (×20): 1 g via INTRAVENOUS
  Filled 2016-05-21 (×25): qty 1

## 2016-05-21 MED ORDER — NOREPINEPHRINE BITARTRATE 1 MG/ML IV SOLN
0.0000 ug/min | Freq: Once | INTRAVENOUS | Status: AC
  Administered 2016-05-21: 2 ug/min via INTRAVENOUS
  Filled 2016-05-21 (×2): qty 4

## 2016-05-21 MED ORDER — DEXTROSE 5 % IV SOLN
2.0000 g | Freq: Three times a day (TID) | INTRAVENOUS | Status: DC
  Administered 2016-05-21 (×2): 2 g via INTRAVENOUS
  Filled 2016-05-21 (×4): qty 2

## 2016-05-21 MED ORDER — POTASSIUM PHOSPHATES 15 MMOLE/5ML IV SOLN
30.0000 mmol | Freq: Once | INTRAVENOUS | Status: AC
  Administered 2016-05-21: 30 mmol via INTRAVENOUS
  Filled 2016-05-21: qty 10

## 2016-05-21 MED ORDER — POTASSIUM CHLORIDE 2 MEQ/ML IV SOLN
30.0000 meq | Freq: Once | INTRAVENOUS | Status: AC
  Administered 2016-05-21: 30 meq via INTRAVENOUS
  Filled 2016-05-21: qty 15

## 2016-05-21 MED ORDER — NOREPINEPHRINE BITARTRATE 1 MG/ML IV SOLN
2.0000 ug/min | INTRAVENOUS | Status: DC
  Administered 2016-05-21: 5 ug/min via INTRAVENOUS
  Filled 2016-05-21 (×2): qty 4

## 2016-05-21 MED ORDER — POTASSIUM CHLORIDE 20 MEQ/15ML (10%) PO SOLN: 40.0000 meq | Freq: Three times a day (TID) | ORAL | Status: DC

## 2016-05-21 NOTE — Progress Notes (Signed)
Pharmacy Antibiotic Note  Alan LaineJerry W Warner is a 55 y.o. male admitted on 05/20/2016 with pelvic osteomyelitis.  Pharmacy has been consulted for Vancomycin/Cefepime dosing. Pt was discharged on Cefepime monotherapy and appears to have worsened over the last few days. WBC elevated. Renal function ok. Pt hypotensive requiring vasopressor therapy.   Plan: -Vancomycin 1000 mg IV q12h -Cefepime 2g IV q8h -Trend WBC, temp, renal function  -Drug levels as indicated   Weight: 160 lb (72.6 kg)  Temp (24hrs), Avg:96.9 F (36.1 C), Min:96.2 F (35.7 C), Max:97.6 F (36.4 C)   Recent Labs Lab 05/14/16 0438 05/14/16 0542 05/15/16 0600 05/15/16 0954 05/16/16 0538 05/20/16 2309 05/20/16 2339 05/21/16 0221 05/21/16 0313  WBC 4.2  --  4.3  --  4.1 14.6*  --   --  12.5*  CREATININE  --  0.69  --  0.65 0.67 0.95  --   --   --   LATICACIDVEN  --   --   --   --   --   --  1.54 0.80  --     Estimated Creatinine Clearance: 92.3 mL/min (by C-G formula based on SCr of 0.95 mg/dL).    Allergies  Allergen Reactions  . Levaquin [Levofloxacin In D5w] Other (See Comments)    PER MEDICATION MAR  . Penicillins Rash and Other (See Comments)    PER MEDICATION MAR  . Vancomycin Other (See Comments)    GI side effects, probably OK to use.     Alan DukeLedford, Alan Warner 05/21/2016 3:37 AM

## 2016-05-21 NOTE — Progress Notes (Signed)
eLink Physician-Brief Progress Note Patient Name: Alan LaineJerry W Mattice DOB: [birth year] MRN: [id number]   Date of Service  05/21/2016  HPI/Events of Note  Low K  eICU Interventions  Replace     Intervention Category Major Interventions: Other:  Margerite Impastato 05/21/2016, 10:24 PM
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
## 2016-05-21 NOTE — Progress Notes (Signed)
Pharmacy Antibiotic Note  Alan LaineJerry W Warner is a 55 y.o. male admitted on 05/20/2016 with pelvic osteomyelitis.  Pharmacy has been consulted for Vancomycin/Cefepime dosing. Pt was discharged on Cefepime monotherapy and appears to have worsened over the last few days so ID is escalating therapy to meropenem. WBC elevated. Renal function ok. Pt hypotensive requiring vasopressor therapy.   Plan: -Continue Vancomycin 1000 mg IV q12h -Meropenem 1g q8 hours -Trend WBC, temp, renal function  -Drug levels as indicated   Height: 6' (182.9 cm) Weight: 161 lb 9.6 oz (73.3 kg) IBW/kg (Calculated) : 77.6  Temp (24hrs), Avg:97.6 F (36.4 C), Min:96.2 F (35.7 C), Max:98.1 F (36.7 C)   Recent Labs Lab 05/15/16 0600 05/15/16 0954 05/16/16 0538 05/20/16 2309 05/20/16 2339 05/21/16 0221 05/21/16 0313  WBC 4.3  --  4.1 14.6*  --   --  12.5*  CREATININE  --  0.65 0.67 0.95  --   --  0.78  LATICACIDVEN  --   --   --   --  1.54 0.80  --     Estimated Creatinine Clearance: 110.7 mL/min (by C-G formula based on SCr of 0.78 mg/dL).    Allergies  Allergen Reactions  . Levaquin [Levofloxacin In D5w] Other (See Comments)    PER MEDICATION MAR  . Penicillins Rash and Other (See Comments)    PER MEDICATION MAR  . Vancomycin Other (See Comments)    GI side effects, probably OK to use.     Sheppard CoilFrank Wilson PharmD., BCPS Clinical Pharmacist Pager [phone] 05/21/2016 4:55 PM

## 2016-05-21 NOTE — Consult Note (Addendum)
       WOC Nurse wound consult note Reason for Consult:chronic pressure ulcers, known to WOC team Wound type: Stage 3 Pressure Injury: left ischial; 0.5cm x 0.5cm x 0.5cm Stage 4 Pressure Injury: right ischial; 9cm x 1.5cm x 0.2cm  Stage 4 Pressure Injury: sacrum; 3.5cm x 2.5cm x 1.0cm  Multiple areas of MASD on inner bilateral thighs, and scrotum Pressure Ulcer POA: Yes Measurement:see above Wound ZOX:[id number]:granulating wound beds Drainage (amount, consistency, odor) copious Periwound:denuded from drainage to the point they are white Dressing procedure/placement/frequency: Pt states he sits in wheel chair from 10-6 daily at his facility.  Geomat ordered for chair, I have provided nurses with orders for Aquacell Ag+ and foam dressings daily, and criticaid purple top barrier cream. Anders SimmondsPete Babcock in room to see wounds.Pt on a low air loss mattress which he will need ordered when transfers out of the unit. If nutritional deficit suggest dietician consult. We will not follow, but will remain available to this patient, to nursing, and the medical and/or surgical teams.  Please re-consult if we need to assist further.    Barnett HatterMelinda Kallam, RN-C, WTA-C Wound Treatment Associate

## 2016-05-21 NOTE — Progress Notes (Addendum)
eLink Physician-Brief Progress Note Patient Name: Alan LaineJerry W Jeon DOB: 08-28-1960 MRN: [id number]   Date of Service  05/21/2016  HPI/Events of Note  K+ = 3.0, Mg++ = 1.2, PO4--- = 1.9 and Creatinine = 0.78.  eICU Interventions  Will replace Mg++, K+ and PO4---.     Intervention Category Major Interventions: Electrolyte abnormality - evaluation and management  Aideliz Garmany Eugene 05/21/2016, 5:02 AM

## 2016-05-21 NOTE — Progress Notes (Signed)
[facility] CSW following for return to SNF at discharge.     Enos FlingAshley Kariem Wolfson, MSW, LCSW [facility] ED/44M Clinical Social Worker [phone]

## 2016-05-21 NOTE — Procedures (Signed)
Old SP cath removed.   The stoma was cleaned w/ chlorahex Then new 622fr cath placed w/ sterile tech Balloon inflated w/ 10 cc sterile saline  Pt tolerated welll.   Simonne MartinetPeter E Aubreana Cornacchia ACNP-BC [facility] Pulmonary/Critical Care Pager # [phone] OR # [phone] if no answer

## 2016-05-22 DIAGNOSIS — L89304 Pressure ulcer of unspecified buttock, stage 4: Secondary | ICD-10-CM

## 2016-05-22 DIAGNOSIS — R6521 Severe sepsis with septic shock: Secondary | ICD-10-CM

## 2016-05-22 DIAGNOSIS — K5909 Other constipation: Secondary | ICD-10-CM

## 2016-05-22 LAB — BASIC METABOLIC PANEL
Anion gap: 5 (ref 5–15)
BUN: 8 mg/dL (ref 6–20)
CHLORIDE: 114 mmol/L — AB (ref 101–111)
CO2: 21 mmol/L — ABNORMAL LOW (ref 22–32)
Calcium: 7.7 mg/dL — ABNORMAL LOW (ref 8.9–10.3)
Creatinine, Ser: 0.76 mg/dL (ref 0.61–1.24)
GFR calc non Af Amer: >60 mL/min (ref >60)
Glucose, Bld: 107 mg/dL — ABNORMAL HIGH (ref 65–99)
POTASSIUM: 3.6 mmol/L (ref 3.5–5.1)
SODIUM: 140 mmol/L (ref 135–145)

## 2016-05-22 LAB — CBC
HCT: 28.7 % — ABNORMAL LOW (ref 39.0–52.0)
HEMOGLOBIN: 9.2 g/dL — AB (ref 13.0–17.0)
MCH: 23.7 pg — ABNORMAL LOW (ref 26.0–34.0)
MCHC: 32.1 g/dL (ref 30.0–36.0)
MCV: 73.8 fL — ABNORMAL LOW (ref 78.0–100.0)
Platelets: 90 10*3/uL — ABNORMAL LOW (ref 150–400)
RBC: 3.89 MIL/uL — AB (ref 4.22–5.81)
RDW: 21.1 % — ABNORMAL HIGH (ref 11.5–15.5)
WBC: 12.1 10*3/uL — AB (ref 4.0–10.5)

## 2016-05-22 LAB — MAGNESIUM: MAGNESIUM: 1.7 mg/dL (ref 1.7–2.4)

## 2016-05-22 MED ORDER — POLYETHYLENE GLYCOL 3350 17 G PO PACK
17.0000 g | PACK | Freq: Every day | ORAL | Status: DC
  Administered 2016-05-22: 17 g via ORAL
  Filled 2016-05-22 (×2): qty 1

## 2016-05-22 MED ORDER — MAGIC MOUTHWASH W/LIDOCAINE
15.0000 mL | Freq: Four times a day (QID) | ORAL | Status: DC
  Administered 2016-05-22 – 2016-05-26 (×13): 15 mL via ORAL
  Filled 2016-05-22 (×18): qty 15

## 2016-05-22 MED ORDER — POTASSIUM CHLORIDE CRYS ER 20 MEQ PO TBCR
40.0000 meq | EXTENDED_RELEASE_TABLET | Freq: Two times a day (BID) | ORAL | Status: AC
  Administered 2016-05-22 (×2): 40 meq via ORAL
  Filled 2016-05-22 (×2): qty 2

## 2016-05-22 MED ORDER — OXYCODONE HCL 5 MG PO TABS
30.0000 mg | ORAL_TABLET | ORAL | Status: DC | PRN
  Administered 2016-05-22 – 2016-05-29 (×21): 30 mg via ORAL
  Filled 2016-05-22 (×25): qty 6

## 2016-05-22 MED ORDER — POTASSIUM CHLORIDE CRYS ER 10 MEQ PO TBCR
EXTENDED_RELEASE_TABLET | ORAL | Status: AC
  Administered 2016-05-22: 10:00:00
  Filled 2016-05-22: qty 2

## 2016-05-22 MED ORDER — BACLOFEN 10 MG PO TABS
10.0000 mg | ORAL_TABLET | Freq: Four times a day (QID) | ORAL | Status: DC
  Administered 2016-05-22 – 2016-05-29 (×25): 10 mg via ORAL
  Filled 2016-05-22 (×29): qty 1

## 2016-05-22 MED ORDER — CLONIDINE HCL 0.1 MG PO TABS: 0.1000 mg | ORAL_TABLET | ORAL | Status: DC | PRN

## 2016-05-22 MED ORDER — BISACODYL 10 MG RE SUPP: 10.0000 mg | Freq: Every day | RECTAL | Status: DC | PRN

## 2016-05-22 MED ORDER — ORAL CARE MOUTH RINSE
15.0000 mL | Freq: Two times a day (BID) | OROMUCOSAL | Status: DC
  Administered 2016-05-23 – 2016-05-28 (×8): 15 mL via OROMUCOSAL

## 2016-05-22 MED ORDER — LINACLOTIDE 145 MCG PO CAPS
145.0000 ug | ORAL_CAPSULE | Freq: Every day | ORAL | Status: DC
  Administered 2016-05-24 – 2016-05-29 (×6): 145 ug via ORAL
  Filled 2016-05-22 (×8): qty 1

## 2016-05-22 MED ORDER — PHENOL 1.4 % MT LIQD
1.0000 | OROMUCOSAL | Status: DC | PRN
  Filled 2016-05-22: qty 177

## 2016-05-22 MED ORDER — PREGABALIN 100 MG PO CAPS
100.0000 mg | ORAL_CAPSULE | Freq: Two times a day (BID) | ORAL | Status: DC
  Administered 2016-05-22 – 2016-05-29 (×14): 100 mg via ORAL
  Filled 2016-05-22 (×11): qty 1
  Filled 2016-05-22: qty 2
  Filled 2016-05-22 (×3): qty 1

## 2016-05-22 MED ORDER — NYSTATIN 100000 UNIT/ML MT SUSP
5.0000 mL | Freq: Four times a day (QID) | OROMUCOSAL | Status: DC
  Administered 2016-05-22 – 2016-05-23 (×2): 500000 [IU] via ORAL
  Filled 2016-05-22 (×6): qty 5

## 2016-05-22 MED ORDER — SENNOSIDES-DOCUSATE SODIUM 8.6-50 MG PO TABS
2.0000 | ORAL_TABLET | Freq: Every day | ORAL | Status: DC
  Administered 2016-05-22: 2 via ORAL
  Filled 2016-05-22 (×2): qty 2

## 2016-05-22 MED ORDER — CHLORHEXIDINE GLUCONATE 0.12 % MT SOLN
15.0000 mL | Freq: Two times a day (BID) | OROMUCOSAL | Status: DC
  Administered 2016-05-23 – 2016-05-29 (×13): 15 mL via OROMUCOSAL
  Filled 2016-05-22 (×14): qty 15

## 2016-05-22 MED ORDER — ENSURE ENLIVE PO LIQD
237.0000 mL | Freq: Three times a day (TID) | ORAL | Status: DC
  Administered 2016-05-22 – 2016-05-29 (×15): 237 mL via ORAL

## 2016-05-22 NOTE — Progress Notes (Signed)
Late Entry  Patient transferred to 3S11 from 73M. CCMD and Elink notified of patient's transfer. Assessment documented in Epic. Patient was not very cooperative and declined to answer the majority of my questions during his assessment. He did allow myself and 2 other RN's to assess his skin. WOC RN consulted yesterday and evaluated patient, but did not document all wounds. Wounds are:  1. Bilateral heels with very hard, darkened areas that appear to be callouses. Foam dressings applied. Pt's own prevalon boots reapplied.  2. Right posterior calf with similiarly appearing calloused area. Foam dressing applied.  3. Scrotum very raw and excoriated with MASD. Skin protectant applied. 4. Suprapubic catheter site raw, bleeding, and excoriated. Wet to dry split gauze dressing applied.  5. Bilateral inner thighs with possible MASD, but skin is sloughed off, with tan/purulent drainage and bleeding noted. Left thigh is worse than right thigh. Wet to dry gauze and ABD's applied to both inner thighs.  6. Patient's entire sacrum/coccyx/bilateral buttocks are covered in multiple wounds. Stage 3/4 [facility]. Entire area appears to be macerated, pale, and actively bleeding/draining with a strong foul odor. Wet to dry dressings applied to entire area. Orders state to use Aquacel, but we did not have any available while doing dressing change.   WOC nurse re-consulted to evaluate all these areas of concern.   Alan Warner, Alan Saulter Eileen, RN.

## 2016-05-22 NOTE — Progress Notes (Signed)
Pt difficult to care for. Refuses to turn q2 for healing and prevention of further wounds states "I feel good like this", tells nurses "yall can't do nothing right" states "what do you get paid to do other then give medicines."

## 2016-05-22 NOTE — Progress Notes (Signed)
eLink Physician-Brief Progress Note Patient Name: Alan Warner DOB: August 30, 1960 MRN: [id number]   Date of Service  05/22/2016  HPI/Events of Note  Patient c/o burning tongue with liquid K+ replacement. Requests change to pill form and numbing spray for tongue.   eICU Interventions  Will order: 1. D/C KCl liquid. 2. K Dur 40 meq PO X 2 doses. 3. Chloroseptic spray 1 spray PRN tongue pain.      Intervention Category Intermediate Interventions: Pain - evaluation and management;Electrolyte abnormality - evaluation and management  Jurni Cesaro Eugene 05/22/2016, 12:03 AM

## 2016-05-22 NOTE — Progress Notes (Signed)
Late Entry  On admission BP 160/130, HR 130's. This did not appear to correlate with patient's previous vitals, but patient declined to have vitals rechecked at this time. Elink notified and new orders received for prn clonidine. Patient then allowed RN to recheck vitals and they were normal. PRN med not given. Will monitor.  Leanna BattlesEckelmann, Nicholaus Steinke Eileen, RN.
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
## 2016-05-22 NOTE — Progress Notes (Signed)
INFECTIOUS DISEASE PROGRESS NOTE  ID: Alan Warner is a 55 y.o. male with  Active Problems:   Severe sepsis (HCC)   Pressure injury of skin  Subjective: Without complaints, feels better  Abtx:  Anti-infectives    Start     Dose/Rate Route Frequency Ordered Stop   05/21/16 1800  meropenem (MERREM) 1 g in sodium chloride 0.9 % 100 mL IVPB     1 g 200 mL/hr over 30 Minutes Intravenous Every 8 hours 05/21/16 1653     05/21/16 1600  ceFEPIme (MAXIPIME) 2 g in dextrose 5 % 50 mL IVPB  Status:  Discontinued     2 g 100 mL/hr over 30 Minutes Intravenous Every 8 hours 05/21/16 1103 05/21/16 1653   05/21/16 1000  vancomycin (VANCOCIN) IVPB 1000 mg/200 mL premix     1,000 mg 200 mL/hr over 60 Minutes Intravenous Every 12 hours 05/21/16 0344     05/21/16 0115  ceFEPIme (MAXIPIME) 2 g in dextrose 5 % 50 mL IVPB  Status:  Discontinued     2 g 100 mL/hr over 30 Minutes Intravenous Every 8 hours 05/21/16 0104 05/21/16 1103   05/21/16 0030  vancomycin (VANCOCIN) IVPB 1000 mg/200 mL premix     1,000 mg 200 mL/hr over 60 Minutes Intravenous  Once 05/21/16 0027 05/21/16 0218      Medications:  Scheduled: . baclofen  10 mg Oral QID  . chlorhexidine  15 mL Mouth Rinse BID  . feeding supplement (ENSURE ENLIVE)  237 mL Oral TID BM  . [START ON 05/23/2016] linaclotide  145 mcg Oral QAC breakfast  . magic mouthwash w/lidocaine  15 mL Oral QID  . mouth rinse  15 mL Mouth Rinse q12n4p  . meropenem (MERREM) IV  1 g Intravenous Q8H  . polyethylene glycol  17 g Oral Daily  . pregabalin  100 mg Oral BID  . senna-docusate  2 tablet Oral QHS  . sodium phosphate  1 enema Rectal Once  . vancomycin  1,000 mg Intravenous Q12H    Objective: Vital signs in last 24 hours: Temp:  [97.3 F (36.3 C)-98.3 F (36.8 C)] 97.4 F (36.3 C) (12/07 1140) Pulse Rate:  [77-162] 100 (12/07 1200) Resp:  [14-34] 15 (12/07 1200) BP: (77-136)/(52-100) 106/70 (12/07 1200) SpO2:  [81 %-100 %] 97 % (12/07  1200) Weight:  [78.7 kg (173 lb 8 oz)] 78.7 kg (173 lb 8 oz) (12/07 0500)   General appearance: alert, cooperative and no distress Resp: clear to auscultation bilaterally Cardio: regular rate and rhythm GI: normal findings: bowel sounds normal and soft, non-tender Extremities: RUE PIC clean.   Lab Results  Recent Labs  05/21/16 0313 05/21/16 2115 05/22/16 0515  WBC 12.5*  --  12.1*  HGB 9.1*  --  9.2*  HCT 27.2*  --  28.7*  NA 141 139 140  K 3.0* 2.8* 3.6  CL 114* 113* 114*  CO2 22 20* 21*  BUN 8 9 8   CREATININE 0.78 0.74 0.76   Liver Panel  Recent Labs  05/20/16 2309  PROT 5.9*  ALBUMIN 1.4*  AST 46*  ALT 15*  ALKPHOS 190*  BILITOT 1.5*   Sedimentation Rate  Recent Labs  05/21/16 0027  ESRSEDRATE 22*   C-Reactive Protein  Recent Labs  05/21/16 0027  CRP 2.3*    Microbiology: Recent Results (from the past 240 hour(s))  Culture, expectorated sputum-assessment     Status: None   Collection Time: 05/12/16 10:35 PM  Result Value Ref  Range Status   Specimen Description EXPECTORATED SPUTUM  Final   Special Requests NONE  Final   Sputum evaluation   Final    THIS SPECIMEN IS ACCEPTABLE. RESPIRATORY CULTURE REPORT TO FOLLOW.   Report Status 05/13/2016 FINAL  Final  Culture, respiratory (NON-Expectorated)     Status: None   Collection Time: 05/12/16 10:35 PM  Result Value Ref Range Status   Specimen Description EXPECTORATED SPUTUM  Final   Special Requests NONE  Final   Gram Stain   Final    ABUNDANT WBC PRESENT, PREDOMINANTLY PMN ABUNDANT SQUAMOUS EPITHELIAL CELLS PRESENT FEW YEAST FEW GRAM POSITIVE RODS RARE GRAM NEGATIVE RODS RARE GRAM POSITIVE COCCI    Culture Consistent with normal respiratory flora.  Final   Report Status 05/15/2016 FINAL  Final  Urine culture     Status: Abnormal (Preliminary result)   Collection Time: 05/20/16 11:09 PM  Result Value Ref Range Status   Specimen Description URINE, RANDOM  Final   Special Requests NONE   Final   Culture (A)  Final    60,000 COLONIES/mL PSEUDOMONAS AERUGINOSA SUSCEPTIBILITIES TO FOLLOW    Report Status PENDING  Incomplete  MRSA PCR Screening     Status: None   Collection Time: 05/21/16  5:40 AM  Result Value Ref Range Status   MRSA by PCR NEGATIVE NEGATIVE Final    Comment:        The GeneXpert MRSA Assay (FDA approved for NASAL specimens only), is one component of a comprehensive MRSA colonization surveillance program. It is not intended to diagnose MRSA infection nor to guide or monitor treatment for MRSA infections.     Studies/Results: Dg Chest Portable 1 View  Result Date: 05/21/2016 CLINICAL DATA:  Hypotension.  Code sepsis. EXAM: PORTABLE CHEST 1 VIEW COMPARISON:  Radiographs 05/11/2016 FINDINGS: Tip of the right central line in the mid/distal SVC. Unchanged heart size and mediastinal contours. No pulmonary edema. Hazy opacity at the left lung base may be atelectasis or small effusion. Right lung is clear. Broad-based scoliotic curvature of the thoracic spine. Surgical hardware in the cervical spine is partially included. IMPRESSION: Hazy left lung base opacity may be atelectasis or pleural effusion. Electronically Signed   By: Rubye OaksMelanie  Ehinger M.D.   On: 05/21/2016 00:34     Assessment/Plan: Sepsis Now off pressure support BCx 12-5 pending Sputum Cx upper resp flora  Decubitus ulcers appreciate WOC f/u.   Prev Pseudomonas UTI 60k now  No change in anbx for now.  Will need to decide on long term plan for sacral osteo.   Total days of antibiotics: 1 vanco/merrem         Alan Warner Infectious Diseases (pager) [phone] [email or web address] 05/22/2016, 2:16 PM  LOS: 1 day

## 2016-05-22 NOTE — Progress Notes (Signed)
Pt has refused breakfast (new breakfast ordered and offered as pt requested) pt still did not eat. pt received lunch tray around 1400 and states "i'm good" when asked if he was ready to eat. New order for ensure noted. Will offer to pt

## 2016-05-22 NOTE — Progress Notes (Signed)
eLink Physician-Brief Progress Note Patient Name: Alan Warner DOB: 04/14/1961 MRN: [id number]   Date of Service  05/22/2016  HPI/Events of Note  Intermittent tachycardia/ hypertension denies pain  eICU Interventions  Clonidine prn      Intervention Category Major Interventions: Hypertension - evaluation and management  Sandrea HughsMichael Mazi Schuff 05/22/2016, 5:39 PM

## 2016-05-22 NOTE — Progress Notes (Signed)
Pt refused oral solution potassium, stated it "burns my tongue". Pt also stated he wanted something for the pain on his tongue. MD made aware and potassium pills ordered as well as chloraseptic mouth spray for his mouth pain. Pt refused mouth spray stating that burns as well.

## 2016-05-23 DIAGNOSIS — D696 Thrombocytopenia, unspecified: Secondary | ICD-10-CM

## 2016-05-23 DIAGNOSIS — E876 Hypokalemia: Secondary | ICD-10-CM

## 2016-05-23 DIAGNOSIS — L89153 Pressure ulcer of sacral region, stage 3: Secondary | ICD-10-CM

## 2016-05-23 DIAGNOSIS — D62 Acute posthemorrhagic anemia: Secondary | ICD-10-CM

## 2016-05-23 DIAGNOSIS — L89304 Pressure ulcer of unspecified buttock, stage 4: Secondary | ICD-10-CM

## 2016-05-23 LAB — URINE CULTURE

## 2016-05-23 LAB — HEMOGLOBIN AND HEMATOCRIT, BLOOD
HCT: 26.1 % — ABNORMAL LOW (ref 39.0–52.0)
Hemoglobin: 8.6 g/dL — ABNORMAL LOW (ref 13.0–17.0)

## 2016-05-23 LAB — VANCOMYCIN, TROUGH: VANCOMYCIN TR: 38 ug/mL — AB (ref 15–20)

## 2016-05-23 MED ORDER — POLYETHYLENE GLYCOL 3350 17 G PO PACK
17.0000 g | PACK | Freq: Two times a day (BID) | ORAL | Status: DC
  Administered 2016-05-24 – 2016-05-27 (×4): 17 g via ORAL
  Filled 2016-05-23 (×8): qty 1

## 2016-05-23 MED ORDER — SODIUM CHLORIDE 0.9 % IV BOLUS (SEPSIS)
500.0000 mL | Freq: Once | INTRAVENOUS | Status: AC
  Administered 2016-05-23: 500 mL via INTRAVENOUS

## 2016-05-23 MED ORDER — FLUCONAZOLE 100 MG PO TABS
150.0000 mg | ORAL_TABLET | Freq: Every day | ORAL | Status: AC
  Administered 2016-05-23 – 2016-05-29 (×7): 150 mg via ORAL
  Filled 2016-05-23: qty 2
  Filled 2016-05-23 (×2): qty 1
  Filled 2016-05-23 (×2): qty 2
  Filled 2016-05-23 (×2): qty 1

## 2016-05-23 MED ORDER — WHITE PETROLATUM GEL
Freq: Four times a day (QID) | Status: DC
  Administered 2016-05-23 (×3): via TOPICAL
  Administered 2016-05-24: 1 via TOPICAL
  Administered 2016-05-24 – 2016-05-29 (×20): via TOPICAL
  Filled 2016-05-23 (×6): qty 1

## 2016-05-23 MED ORDER — ACETAMINOPHEN 325 MG PO TABS: 650.0000 mg | ORAL_TABLET | ORAL | Status: DC | PRN

## 2016-05-23 MED ORDER — SODIUM CHLORIDE 0.9 % IV BOLUS (SEPSIS)
250.0000 mL | Freq: Once | INTRAVENOUS | Status: AC
  Administered 2016-05-23: 250 mL via INTRAVENOUS

## 2016-05-23 MED ORDER — NYSTATIN 100000 UNIT/GM EX OINT
TOPICAL_OINTMENT | Freq: Three times a day (TID) | CUTANEOUS | Status: DC
  Administered 2016-05-23 – 2016-05-27 (×13): via TOPICAL
  Administered 2016-05-28: 1 via TOPICAL
  Administered 2016-05-28 – 2016-05-29 (×4): via TOPICAL
  Filled 2016-05-23 (×2): qty 15

## 2016-05-23 MED ORDER — SENNOSIDES-DOCUSATE SODIUM 8.6-50 MG PO TABS
2.0000 | ORAL_TABLET | Freq: Two times a day (BID) | ORAL | Status: DC
  Administered 2016-05-23 – 2016-05-29 (×9): 2 via ORAL
  Filled 2016-05-23 (×10): qty 2
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
## 2016-05-23 NOTE — Progress Notes (Signed)
INFECTIOUS DISEASE PROGRESS NOTE  ID: Alan LaineJerry W Cumbie is a 55 y.o. male with  Active Problems:   Severe sepsis (HCC)   Pressure injury of skin   Decubitus ulcer of buttock, stage 4 (HCC)  Subjective: Spitting up blood  Abtx:  Anti-infectives    Start     Dose/Rate Route Frequency Ordered Stop   05/21/16 1800  meropenem (MERREM) 1 g in sodium chloride 0.9 % 100 mL IVPB     1 g 200 mL/hr over 30 Minutes Intravenous Every 8 hours 05/21/16 1653     05/21/16 1600  ceFEPIme (MAXIPIME) 2 g in dextrose 5 % 50 mL IVPB  Status:  Discontinued     2 g 100 mL/hr over 30 Minutes Intravenous Every 8 hours 05/21/16 1103 05/21/16 1653   05/21/16 1000  vancomycin (VANCOCIN) IVPB 1000 mg/200 mL premix     1,000 mg 200 mL/hr over 60 Minutes Intravenous Every 12 hours 05/21/16 0344     05/21/16 0115  ceFEPIme (MAXIPIME) 2 g in dextrose 5 % 50 mL IVPB  Status:  Discontinued     2 g 100 mL/hr over 30 Minutes Intravenous Every 8 hours 05/21/16 0104 05/21/16 1103   05/21/16 0030  vancomycin (VANCOCIN) IVPB 1000 mg/200 mL premix     1,000 mg 200 mL/hr over 60 Minutes Intravenous  Once 05/21/16 0027 05/21/16 0218      Medications:  Scheduled: . baclofen  10 mg Oral QID  . chlorhexidine  15 mL Mouth Rinse BID  . feeding supplement (ENSURE ENLIVE)  237 mL Oral TID BM  . linaclotide  145 mcg Oral QAC breakfast  . magic mouthwash w/lidocaine  15 mL Oral QID  . mouth rinse  15 mL Mouth Rinse q12n4p  . meropenem (MERREM) IV  1 g Intravenous Q8H  . nystatin  5 mL Oral QID  . polyethylene glycol  17 g Oral Daily  . pregabalin  100 mg Oral BID  . senna-docusate  2 tablet Oral QHS  . sodium phosphate  1 enema Rectal Once  . vancomycin  1,000 mg Intravenous Q12H    Objective: Vital signs in last 24 hours: Temp:  [97.4 F (36.3 C)-98.6 F (37 C)] 98.2 F (36.8 C) (12/08 0727) Pulse Rate:  [82-129] 96 (12/08 0727) Resp:  [10-21] 15 (12/08 0727) BP: (67-106)/(46-75) 94/63 (12/08 0727) SpO2:  [97  %-100 %] 98 % (12/08 0727)   General appearance: alert, cooperative and mild distress Resp: clear to auscultation bilaterally Cardio: regular rate and rhythm GI: normal findings: bowel sounds normal and soft, non-tender Extremities: RUE PIC clean  Lab Results  Recent Labs  05/21/16 0313 05/21/16 2115 05/22/16 0515  WBC 12.5*  --  12.1*  HGB 9.1*  --  9.2*  HCT 27.2*  --  28.7*  NA 141 139 140  K 3.0* 2.8* 3.6  CL 114* 113* 114*  CO2 22 20* 21*  BUN 8 9 8   CREATININE 0.78 0.74 0.76   Liver Panel  Recent Labs  05/20/16 2309  PROT 5.9*  ALBUMIN 1.4*  AST 46*  ALT 15*  ALKPHOS 190*  BILITOT 1.5*   Sedimentation Rate  Recent Labs  05/21/16 0027  ESRSEDRATE 22*   C-Reactive Protein  Recent Labs  05/21/16 0027  CRP 2.3*    Microbiology: Recent Results (from the past 240 hour(s))  Blood Culture (routine x 2)     Status: None (Preliminary result)   Collection Time: 05/20/16 11:00 PM  Result Value Ref  Range Status   Specimen Description BLOOD PICC LINE  Final   Special Requests BOTTLES DRAWN AEROBIC AND ANAEROBIC 5ML  Final   Culture NO GROWTH 2 DAYS  Final   Report Status PENDING  Incomplete  Urine culture     Status: Abnormal   Collection Time: 05/20/16 11:09 PM  Result Value Ref Range Status   Specimen Description URINE, RANDOM  Final   Special Requests NONE  Final   Culture 60,000 COLONIES/mL PSEUDOMONAS AERUGINOSA (A)  Final   Report Status 05/23/2016 FINAL  Final   Organism ID, Bacteria PSEUDOMONAS AERUGINOSA (A)  Final      Susceptibility   Pseudomonas aeruginosa - MIC*    CEFTAZIDIME 4 SENSITIVE Sensitive     CIPROFLOXACIN 0.5 SENSITIVE Sensitive     GENTAMICIN 8 INTERMEDIATE Intermediate     IMIPENEM 2 SENSITIVE Sensitive     CEFEPIME 2 SENSITIVE Sensitive     * 60,000 COLONIES/mL PSEUDOMONAS AERUGINOSA  Blood Culture (routine x 2)     Status: None (Preliminary result)   Collection Time: 05/20/16 11:35 PM  Result Value Ref Range Status    Specimen Description BLOOD LEFT HAND  Final   Special Requests IN PEDIATRIC BOTTLE 2ML  Final   Culture NO GROWTH 2 DAYS  Final   Report Status PENDING  Incomplete  MRSA PCR Screening     Status: None   Collection Time: 05/21/16  5:40 AM  Result Value Ref Range Status   MRSA by PCR NEGATIVE NEGATIVE Final    Comment:        The GeneXpert MRSA Assay (FDA approved for NASAL specimens only), is one component of a comprehensive MRSA colonization surveillance program. It is not intended to diagnose MRSA infection nor to guide or monitor treatment for MRSA infections.     Studies/Results: No results found.   Assessment/Plan: Sepsis off pressure support BCx 12-5 ngtd Sputum Cx upper resp flora  Decubitus ulcers appreciate WOC f/u.  Aim for 6 weeks of vanco/merrem  Prev Pseudomonas UTI 60k now  ? GI bleed Will send stat h/h Will defer to primary He has IV access T/S   Total days of antibiotics: 2 vanco/merrem                                                                                    Johny SaxJeffrey Juniel Groene Infectious Diseases (pager) [phone] [email or web address] 05/23/2016, 9:54 AM  LOS: 2 days

## 2016-05-23 NOTE — Progress Notes (Signed)
MD paged about blood coming from patients mouth, RN unsure where. Suction cannister in room filled with blood.

## 2016-05-23 NOTE — Consult Note (Signed)
WOC Nurse wound consult note Reason for Consult: re-consulted for heels, legs and thighs Patient is well known to the WOC team.  When we saw patient earlier this week it was noted that the patient has MASD thighs and scrotum.  This was new for this admission.  Apparently the SP cath has been leaking, per the patient.   Wound type: Bilateral ischial wounds, previously documented in our note Sacral/coccyx wound, previously documented in our note MASD: inner thighs with partial thickness skin loss and bleeding; scrotum and around the stoma site (SP cath site).  This is SEVERE.  WOC has assessed the bilateral heels and the right lateral calf today.  Right heel, I did not find a pressure injury here Right lateral leg, chronic stable area, most likely Unstageable Pressure injury however with patient poor healing, failure to thrive and current nutritional status this area should be left intact and protected from further injury. Left heel: calloused Patient refuses to allow WOC nurse to re-assess sacral and ischial wounds today  Pressure Ulcer POA: Yes Measurement: Right lateral calf: 4.5cm x 0.5cm x 0 (new area measured today) Wound bed:see flow sheets and previous WOC consult notes Drainage (amount, consistency, odor) moderate to heavy from the sacral and ischial wounds, consistent with chronic osteomyelitis  Periwound: intact   Dressing procedure/placement/frequency:  Will add silver hydrofiber to the inner thighs for the antimicrobial effects and to attempt to dry this area out.  Currently the SP is not leaking so hopefully this will help.  I will re-assess the patient on Monday and may need to switch to different topical care if area is not improving. Concerned patient may have candida overgrowth in the groin and inner thighs based on presentation and satellite lesions in the right groin.  Will ask MD to consider PO antifungal.   Add hydroconductive dressing around the SP cath site, it is a  bit macerated, this should dry site up a bit.   Will maintain current topical care for the sacral and ischial wounds, attempt to re-assess early next week.  Patient has low air loss mattress which will aid in management of moisture and pressure redistribution  Foam dressing to the right lateral leg wound to protect from further injury  Heel lift boots in place to protect heels from pressure (from facility he resides).   Needs appropriate seating cushion for his wheelchair, DME company should be able to accommodate this need.    WOC Nurse team will follow along with you for weekly wound assessments.  Please notify me of any acute changes in the wounds or any new areas of concerns Ronrico Dupin [facility] MSN, RN,CWOCN, CNS [phone]

## 2016-05-23 NOTE — Progress Notes (Signed)
RN re-page to Md to ask him to call daughter with update. Phone number sent in text page

## 2016-05-23 NOTE — NC FL2 (Signed)
[place] MEDICAID FL2 LEVEL OF CARE SCREENING TOOL     IDENTIFICATION  Patient Name: Alan Warner Birthdate: 18-Sep-1960 Sex: male Admission Date (Current Location): 05/20/2016  [facility] [place]   Fac[facility] and Address:  [facility] [address], [place], [place] [address]      Provider Number: [id number]  Attending Physician Name and Address:  Lonia BloodJeffrey T McClung, MD  Relative Name and Phone Number:       Current Level of Care: Hospital Recommended Level of Care: Skilled Nursing Facility Prior Approval Number:    Date Approved/Denied:   PASRR Number: [id number] [id number]  Discharge Plan: SNF    Current Diagnoses: Patient Active Problem List   Diagnosis Date Noted  . Decubitus ulcer of buttock, stage 4 (HCC)   . Severe sepsis (HCC) 05/21/2016  . Pressure injury of skin 05/21/2016  . Hip pain 05/15/2016  . Other pancytopenia (HCC) 05/15/2016  . Hypotension 05/12/2016  . Urinary tract infection associated with indwelling urethral catheter (HCC)   . Pseudomonas infection   . GERD (gastroesophageal reflux disease) 05/03/2016  . Depression with anxiety 05/03/2016  . Sepsis (HCC) 05/03/2016  . Hyperglycemia 04/26/2016  . Anemia, chronic disease 12/24/2015  . Sepsis secondary to UTI (HCC) 12/12/2015  . HCAP (healthcare-associated pneumonia) 12/12/2015  . Pressure ulcer 12/12/2015  . Constipation   . Septic shock (HCC) 08/02/2015  . 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 Fecal impaction (HCC) 04/24/2015  . Hydronephrosis, right 04/24/2015  . Hypokalemia 04/24/2015  . Fever with multiple potential sources of infection 04/24/2015  . UTI (lower urinary tract infection) 04/24/2015  . Complicated UTI (urinary tract infection) 04/24/2015  . Cough 10/05/2014  . Paraplegia (HCC) 10/05/2014  . Chronic pain 10/05/2014  . Sacral decubitus ulcer 10/05/2014    Orientation RESPIRATION BLADDER Height & Weight     Self, Time, Situation, Place  Normal Continent (Suprapubic catheter) Weight: 173 lb 8 oz (78.7 kg) Height:  6' (182.9 cm)  BEHAVIORAL SYMPTOMS/MOOD NEUROLOGICAL BOWEL NUTRITION STATUS   (None)  (None) Incontinent Diet (Regular)  AMBULATORY STATUS COMMUNICATION OF NEEDS Skin   Extensive Assist Verbally Other (Comment), PU Stage and Appropriate Care (Catheter entry/exit. MASD. Three stage 3 pressure injuries, two stage 4 pressure injuries, four unstageable pressure injuries, one unstageable pressure ulcer. )                       Personal Care Assistance Level of Assistance    Bathing Assistance: Maximum assistance   Dressing Assistance: Maximum assistance     Functional Limitations Info  Sight, Hearing, Speech Sight Info: Adequate Hearing Info: Adequate Speech Info: Adequate    SPECIAL CARE FACTORS FREQUENCY  Blood pressure                    Contractures Contractures Info: Not present    Additional Factors Info  Code Status, Allergies Code Status Info: Full Allergies Info: Levaquin (Levofloxacin In D5w), Penicillins, Vancomycin           Current Medications (05/23/2016):  This is the current hospital active medication list Current Facility-Administered Medications  Medication Dose Route Frequency Provider Last Rate Last Dose  . 0.45 % sodium chloride infusion   Intravenous Continuous Lonia BloodJeffrey T McClung, MD 10 mL/hr at 05/23/16 1320    . acetaminophen (TYLENOL) tablet 650 mg  650 mg Oral Q4H PRN Lonia BloodJeffrey T McClung, MD      . baclofen (LIORESAL) tablet 10 mg  10 mg Oral QID Simonne MartinetPeter E Babcock, NP   10 mg at 05/23/16 1400  . bisacodyl (DULCOLAX) suppository 10 mg  10 mg Rectal Daily PRN Simonne MartinetPeter E Babcock, NP      . chlorhexidine (PERIDEX) 0.12 % solution 15 mL  15 mL Mouth Rinse BID Roslynn AmbleJennings E Nestor, MD   15 mL at 05/23/16 0015  . cloNIDine (CATAPRES) tablet 0.1 mg  0.1 mg Oral Q4H PRN Nyoka CowdenMichael B Wert, MD      . feeding supplement (ENSURE ENLIVE) (ENSURE ENLIVE) liquid 237 mL  237 mL Oral TID BM  Roslynn AmbleJennings E Nestor, MD   237 mL at 05/23/16 1422  . fluconazole (DIFLUCAN) tablet 150 mg  150 mg Oral Daily Lonia BloodJeffrey T McClung, MD   150 mg at 05/23/16 1359  . linaclotide (LINZESS) capsule 145 mcg  145 mcg Oral QAC breakfast Simonne MartinetPeter E Babcock, NP      . magic mouthwash w/lidocaine  15 mL Oral QID Simonne MartinetPeter E Babcock, NP   15 mL at 05/23/16 1323  . MEDLINE mouth rinse  15 mL Mouth Rinse q12n4p Roslynn AmbleJennings E Nestor, MD   15 mL at 05/23/16 1108  . meropenem (MERREM) 1 g in sodium chloride 0.9 % 100 mL IVPB  1 g Intravenous Q8H Earnie LarssonFrank R Wilson, RPH   1 g at 05/23/16 1320  . nystatin ointment (MYCOSTATIN)   Topical TID Lonia BloodJeffrey T McClung, MD      . oxyCODONE (Oxy IR/ROXICODONE) immediate release tablet 30 mg  30 mg Oral Q4H PRN Simonne MartinetPeter E Babcock, NP   30 mg at 05/23/16 1051  . phenol (CHLORASEPTIC) mouth spray 1 spray  1 spray Mouth/Throat PRN Karl ItoSteven E Sommer, MD      . polyethylene glycol (MIRALAX / GLYCOLAX) packet 17 g  17 g Oral BID Lonia BloodJeffrey T McClung, MD      . pregabalin (LYRICA) capsule 100 mg  100 mg Oral BID Simonne MartinetPeter E Babcock, NP   100 mg at 05/23/16 1051  . senna-docusate (Senokot-S) tablet 2 tablet  2 tablet Oral BID Lonia BloodJeffrey T McClung, MD      . white petrolatum (VASELINE) gel   Topical QID Lonia BloodJeffrey T McClung, MD         Discharge Medications: Please see discharge summary for a list of discharge medications.  Relevant Imaging Results:  Relevant Lab Results:   Additional Information SS#: [id number]  Margarito LinerSarah C Joal Eakle, LCSW

## 2016-05-23 NOTE — Clinical Social Work Note (Signed)
Clinical Social Work Assessment  Patient Details  Name: Alan Warner MRN: [id number] Date of Birth: [birth year]  Date of referral:  05/23/16               Reason for consult:  Discharge Planning                Permission sought to share information with:  Facility Sport and exercise psychologist, Family Supports Permission granted to share information::  Yes, Verbal Permission Granted  Name::     Charlesetta Shanks  Agency::  [employer]  Relationship::  Daughter  Contact Information:  [phone]  Housing/Transportation Living arrangements for the past 2 months:  [place] of Information:  Patient, Medical Team, Facility Patient Interpreter Needed:  None Criminal Activity/Legal Involvement Pertinent to Current Situation/Hospitalization:  No - Comment as needed Significant Relationships:  Adult Children Lives with:  Facility Resident Do you feel safe going back to the place where you live?  Yes Need for family participation in patient care:  Yes (Comment)  Care giving concerns:  Patient is a long-term resident from [employer].   Social Worker assessment / plan:  CSW met with patient. No supports at bedside. CSW introduced role and explained that discharge planning would be discussed. Patient confirmed that he came from [employer] and plans to return once stable. Patient would only answer yes to questions and then began to yell multiple times to close the door all the way even thought CSW said that the door was being closed all the way. No further concerns. CSW encouraged patient to contact CSW as needed. CSW will continue to follow patient for support and facilitate discharge back to SNF once medically stable.  Employment status:  Disabled (Comment on whether or not currently receiving Disability) Insurance information:  Medicare PT Recommendations:  Not assessed at this time Information / Referral to community resources:  [place]  Patient/Family's Response to care:  Patient agreeable to return to [employer]. Patient's daughter supportive and involved in patient's care. Patient appreciated social work intervention.  Patient/Family's Understanding of and Emotional Response to Diagnosis, Current Treatment, and Prognosis:  Patient understands and is agreeable to discharge plan. Patient did not engage with CSW enough to know if he is happy with hospital care or not.  Emotional Assessment Appearance:  Appears stated age Attitude/Demeanor/Rapport:  Guarded, Other (Irritable) Affect (typically observed):  Agitated Orientation:  Oriented to Self, Oriented to Place, Oriented to  Time, Oriented to Situation Alcohol / Substance use:  Never Used Psych involvement (Current and /or in the community):  No (Comment)  Discharge Needs  Concerns to be addressed:  Care Coordination Readmission within the last 30 days:  Yes Current discharge risk:  Dependent with Mobility Barriers to Discharge:  Continued Medical Work up   Candie Chroman, LCSW 05/23/2016, 11:53 AM
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
## 2016-05-23 NOTE — Progress Notes (Signed)
Pt received 500cc bolus [facility] 70s after 250cc bolus. Pt BP currently 99/46. Pt coherent and asymptomatic. States he is feeling fine. Will continue to monitor.

## 2016-05-23 NOTE — Progress Notes (Signed)
eLink Physician-Brief Progress Note Patient Name: Alan LaineJerry W Komorowski DOB: Sep 25, 1960 MRN: [id number]   Date of Service  05/23/2016  HPI/Events of Note  Systolic BP in the low to mid 40J80s.  MAP in the 60s.  eICU Interventions  Plan: 250 cc NS bolus for BP support     Intervention Category Intermediate Interventions: Hypotension - evaluation and management  DETERDING,ELIZABETH 05/23/2016, 2:08 AM

## 2016-05-23 NOTE — Progress Notes (Signed)
Patients SBP in the low 80's was given pain med earlier after 2 hours of pain med BP remains low x2. Dr.Deterding  made aware with orders made. 250 NS bolus initiated and given will monitor BP  after bolus.

## 2016-05-24 ENCOUNTER — Other Ambulatory Visit (HOSPITAL_COMMUNITY): Payer: Medicare Other

## 2016-05-24 ENCOUNTER — Inpatient Hospital Stay (HOSPITAL_COMMUNITY): Payer: Medicare Other

## 2016-05-24 LAB — CBC
HCT: 24.1 % — ABNORMAL LOW (ref 39.0–52.0)
Hemoglobin: 7.7 g/dL — ABNORMAL LOW (ref 13.0–17.0)
MCH: 24.4 pg — ABNORMAL LOW (ref 26.0–34.0)
MCHC: 32 g/dL (ref 30.0–36.0)
MCV: 76.5 fL — ABNORMAL LOW (ref 78.0–100.0)
PLATELETS: 70 10*3/uL — AB (ref 150–400)
RBC: 3.15 MIL/uL — AB (ref 4.22–5.81)
RDW: 22.7 % — AB (ref 11.5–15.5)
WBC: 5.7 10*3/uL (ref 4.0–10.5)

## 2016-05-24 LAB — COMPREHENSIVE METABOLIC PANEL
ALT: 16 U/L — AB (ref 17–63)
AST: 48 U/L — AB (ref 15–41)
Albumin: 1.4 g/dL — ABNORMAL LOW (ref 3.5–5.0)
Alkaline Phosphatase: 447 U/L — ABNORMAL HIGH (ref 38–126)
Anion gap: 7 (ref 5–15)
BUN: 12 mg/dL (ref 6–20)
CHLORIDE: 114 mmol/L — AB (ref 101–111)
CO2: 20 mmol/L — AB (ref 22–32)
CREATININE: 1.09 mg/dL (ref 0.61–1.24)
Calcium: 7.9 mg/dL — ABNORMAL LOW (ref 8.9–10.3)
GFR calc Af Amer: >60 mL/min (ref >60)
GFR calc non Af Amer: >60 mL/min (ref >60)
Glucose, Bld: 88 mg/dL (ref 65–99)
POTASSIUM: 3.4 mmol/L — AB (ref 3.5–5.1)
SODIUM: 141 mmol/L (ref 135–145)
Total Bilirubin: 1.6 mg/dL — ABNORMAL HIGH (ref 0.3–1.2)
Total Protein: 5.3 g/dL — ABNORMAL LOW (ref 6.5–8.1)

## 2016-05-24 LAB — PROTIME-INR
INR: 1.72
Prothrombin Time: 20.4 seconds — ABNORMAL HIGH (ref 11.4–15.2)

## 2016-05-24 LAB — VANCOMYCIN, RANDOM: Vancomycin Rm: 40

## 2016-05-24 LAB — APTT: APTT: 45 s — AB (ref 24–36)

## 2016-05-24 MED ORDER — POTASSIUM CHLORIDE CRYS ER 20 MEQ PO TBCR
20.0000 meq | EXTENDED_RELEASE_TABLET | Freq: Two times a day (BID) | ORAL | Status: AC
  Administered 2016-05-24 – 2016-05-26 (×6): 20 meq via ORAL
  Filled 2016-05-24 (×6): qty 1

## 2016-05-24 MED ORDER — IOPAMIDOL (ISOVUE-300) INJECTION 61%
INTRAVENOUS | Status: DC
Start: 2016-05-24 — End: 2016-05-24
  Filled 2016-05-24: qty 100

## 2016-05-24 MED ORDER — SIMETHICONE 80 MG PO CHEW
160.0000 mg | CHEWABLE_TABLET | Freq: Four times a day (QID) | ORAL | Status: DC | PRN
  Filled 2016-05-24: qty 2

## 2016-05-24 MED ORDER — IOPAMIDOL (ISOVUE-300) INJECTION 61%
INTRAVENOUS | Status: AC
  Administered 2016-05-24: 100 mL
  Filled 2016-05-24: qty 100

## 2016-05-24 MED ORDER — PREGABALIN 100 MG PO CAPS: 100.0000 mg | ORAL_CAPSULE | Freq: Two times a day (BID) | ORAL | Status: DC

## 2016-05-24 MED ORDER — IOPAMIDOL (ISOVUE-300) INJECTION 61%
INTRAVENOUS | Status: AC
  Filled 2016-05-24: qty 30

## 2016-05-24 MED ORDER — ENSURE ENLIVE PO LIQD: 237.0000 mL | Freq: Two times a day (BID) | ORAL | Status: DC

## 2016-05-24 MED ORDER — BACLOFEN 10 MG PO TABS: 10.0000 mg | ORAL_TABLET | Freq: Four times a day (QID) | ORAL | Status: DC

## 2016-05-24 MED ORDER — DIPHENHYDRAMINE HCL 25 MG PO TABS
25.0000 mg | ORAL_TABLET | ORAL | Status: DC | PRN
  Filled 2016-05-24: qty 1

## 2016-05-24 MED ORDER — ADULT MULTIVITAMIN W/MINERALS CH
1.0000 | ORAL_TABLET | Freq: Every day | ORAL | Status: DC
Start: 2016-05-24 — End: 2016-05-30
  Administered 2016-05-24 – 2016-05-29 (×5): 1 via ORAL
  Filled 2016-05-24 (×5): qty 1
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
## 2016-05-25 LAB — COMPREHENSIVE METABOLIC PANEL
ALBUMIN: 1.4 g/dL — AB (ref 3.5–5.0)
ALT: 15 U/L — ABNORMAL LOW (ref 17–63)
AST: 44 U/L — ABNORMAL HIGH (ref 15–41)
Alkaline Phosphatase: 483 U/L — ABNORMAL HIGH (ref 38–126)
Anion gap: 6 (ref 5–15)
BUN: 12 mg/dL (ref 6–20)
CHLORIDE: 113 mmol/L — AB (ref 101–111)
CO2: 20 mmol/L — AB (ref 22–32)
Calcium: 7.5 mg/dL — ABNORMAL LOW (ref 8.9–10.3)
Creatinine, Ser: 0.93 mg/dL (ref 0.61–1.24)
GFR calc Af Amer: >60 mL/min (ref >60)
Glucose, Bld: 87 mg/dL (ref 65–99)
POTASSIUM: 3.7 mmol/L (ref 3.5–5.1)
SODIUM: 139 mmol/L (ref 135–145)
Total Bilirubin: 1.1 mg/dL (ref 0.3–1.2)
Total Protein: 5.2 g/dL — ABNORMAL LOW (ref 6.5–8.1)

## 2016-05-25 LAB — CBC
HEMATOCRIT: 21 % — AB (ref 39.0–52.0)
Hemoglobin: 6.6 g/dL — CL (ref 13.0–17.0)
MCH: 24.4 pg — AB (ref 26.0–34.0)
MCHC: 31.4 g/dL (ref 30.0–36.0)
MCV: 77.8 fL — AB (ref 78.0–100.0)
Platelets: 47 10*3/uL — ABNORMAL LOW (ref 150–400)
RBC: 2.7 MIL/uL — ABNORMAL LOW (ref 4.22–5.81)
RDW: 23.2 % — AB (ref 11.5–15.5)
WBC: 2.5 10*3/uL — AB (ref 4.0–10.5)

## 2016-05-25 LAB — PREPARE RBC (CROSSMATCH)

## 2016-05-25 LAB — PHOSPHORUS: Phosphorus: 1.8 mg/dL — ABNORMAL LOW (ref 2.5–4.6)

## 2016-05-25 LAB — MAGNESIUM: Magnesium: 1.5 mg/dL — ABNORMAL LOW (ref 1.7–2.4)

## 2016-05-25 LAB — VANCOMYCIN, RANDOM: VANCOMYCIN RM: 23

## 2016-05-25 MED ORDER — VANCOMYCIN HCL IN DEXTROSE 1-5 GM/200ML-% IV SOLN
1000.0000 mg | Freq: Once | INTRAVENOUS | Status: AC
  Administered 2016-05-25: 1000 mg via INTRAVENOUS
  Filled 2016-05-25: qty 200

## 2016-05-25 MED ORDER — K PHOS MONO-SOD PHOS DI & MONO 155-852-130 MG PO TABS
500.0000 mg | ORAL_TABLET | Freq: Two times a day (BID) | ORAL | Status: DC
  Administered 2016-05-25 – 2016-05-26 (×3): 500 mg via ORAL
  Filled 2016-05-25 (×3): qty 2

## 2016-05-25 MED ORDER — MAGNESIUM OXIDE 400 (241.3 MG) MG PO TABS
400.0000 mg | ORAL_TABLET | Freq: Two times a day (BID) | ORAL | Status: DC
  Administered 2016-05-25 – 2016-05-29 (×9): 400 mg via ORAL
  Filled 2016-05-25 (×9): qty 1

## 2016-05-25 MED ORDER — SODIUM CHLORIDE 0.9 % IV SOLN
Freq: Once | INTRAVENOUS | Status: AC
  Administered 2016-05-25: 11:00:00 via INTRAVENOUS

## 2016-05-25 MED ORDER — FLEET ENEMA 7-19 GM/118ML RE ENEM: 1.0000 | ENEMA | Freq: Every day | RECTAL | Status: DC | PRN
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
## 2016-05-25 NOTE — Progress Notes (Signed)
Pharmacy Antibiotic Note  Alan Warner is a 55 y.o. male admitted on 05/20/2016 with sepsis, UTI, possible osteomylitis.  Pharmacy has been consulted for vancomycin and meropenem dosing. Vancomycin trough was checked on 12/8 which was elevated, and further doses were held (previously on vancomycin 1g Q12H w/last dose on 12/7). SCr/estimated Cl unreliable due to paraplegia, I&O similar since that time.  Vancomycin random level today now 23 (goal 15-20). Will give 1 time dose timed a little later today with vanc random in AM. If continues to clear may be able to schedule daily.  Plan: Meropenem 1g IV q8hr Vancomycin 1g*1 today Watch cultures, renal fx   Height: 6' (182.9 cm) Weight: 183 lb 6.2 oz (83.2 kg) IBW/kg (Calculated) : 77.6  Temp (24hrs), Avg:97.7 F (36.5 C), Min:97 F (36.1 C), Max:98.2 F (36.8 C)   Recent Labs Lab 05/20/16 2309 05/20/16 2339 05/21/16 0221 05/21/16 0313 05/21/16 2115 05/22/16 0515 05/23/16 1026 05/24/16 0434 05/25/16 0454  WBC 14.6*  --   --  12.5*  --  12.1*  --  5.7 2.5*  CREATININE 0.95  --   --  0.78 0.74 0.76  --  1.09 0.93  LATICACIDVEN  --  1.54 0.80  --   --   --   --   --   --   VANCOTROUGH  --   --   --   --   --   --  6838*  --   --   VANCORANDOM  --   --   --   --   --   --   --  40 23    Estimated Creatinine Clearance: 100.8 mL/min (by C-G formula based on SCr of 0.93 mg/dL).    Allergies  Allergen Reactions  . Levaquin [Levofloxacin In D5w] Other (See Comments)    PER MEDICATION MAR  . Penicillins Rash and Other (See Comments)    PER MEDICATION MAR  . Vancomycin Other (See Comments)    GI side effects, probably OK to use.     Antimicrobials this admission: PTA 12/1 cefepime >> 12/6 PTA 12/1 flagyl >> 12/5 Fluconazole 12/8>>12/15 Vanc 12/6 >>  Meropenem 12/6 >>   Dose adjustments this admission: 12/8: VT 38 - hold 12/9: VR 40 - hold 12/10: VR 23 - vanco 1g*1  Microbiology results: 12/5 urine cx: pseudomonas 12/5 blood  cx: ngtd 12/6 mrsa pcr: neg  Thank you for allowing pharmacy to be a part of this patient's care.  Sherron Mondayubrey N Maurio Baize 05/25/2016 8:09 AM

## 2016-05-26 DIAGNOSIS — M4628 Osteomyelitis of vertebra, sacral and sacrococcygeal region: Secondary | ICD-10-CM

## 2016-05-26 LAB — CULTURE, BLOOD (ROUTINE X 2)
CULTURE: NO GROWTH
CULTURE: NO GROWTH

## 2016-05-26 LAB — TYPE AND SCREEN
BLOOD PRODUCT EXPIRATION DATE: 201712132359
BLOOD PRODUCT EXPIRATION DATE: 201712132359
ISSUE DATE / TIME: 201712101042
ISSUE DATE / TIME: 201712101611
UNIT TYPE AND RH: 600
Unit Type and Rh: 600

## 2016-05-26 LAB — VANCOMYCIN, RANDOM: VANCOMYCIN RM: 25

## 2016-05-26 LAB — COMPREHENSIVE METABOLIC PANEL
ALBUMIN: 1.4 g/dL — AB (ref 3.5–5.0)
ALK PHOS: 926 U/L — AB (ref 38–126)
ALT: 18 U/L (ref 17–63)
AST: 59 U/L — AB (ref 15–41)
Anion gap: 7 (ref 5–15)
BUN: 11 mg/dL (ref 6–20)
CHLORIDE: 113 mmol/L — AB (ref 101–111)
CO2: 19 mmol/L — AB (ref 22–32)
CREATININE: 0.84 mg/dL (ref 0.61–1.24)
Calcium: 7.4 mg/dL — ABNORMAL LOW (ref 8.9–10.3)
GFR calc non Af Amer: >60 mL/min (ref >60)
GLUCOSE: 79 mg/dL (ref 65–99)
Potassium: 3.7 mmol/L (ref 3.5–5.1)
SODIUM: 139 mmol/L (ref 135–145)
Total Bilirubin: 1.4 mg/dL — ABNORMAL HIGH (ref 0.3–1.2)
Total Protein: 5.6 g/dL — ABNORMAL LOW (ref 6.5–8.1)

## 2016-05-26 LAB — CBC
HCT: 25.3 % — ABNORMAL LOW (ref 39.0–52.0)
Hemoglobin: 8.3 g/dL — ABNORMAL LOW (ref 13.0–17.0)
MCH: 25.9 pg — ABNORMAL LOW (ref 26.0–34.0)
MCHC: 32.8 g/dL (ref 30.0–36.0)
MCV: 79.1 fL (ref 78.0–100.0)
PLATELETS: 69 10*3/uL — AB (ref 150–400)
RBC: 3.2 MIL/uL — AB (ref 4.22–5.81)
RDW: 21.9 % — ABNORMAL HIGH (ref 11.5–15.5)
WBC: 2.2 10*3/uL — AB (ref 4.0–10.5)

## 2016-05-26 LAB — MAGNESIUM: MAGNESIUM: 1.5 mg/dL — AB (ref 1.7–2.4)

## 2016-05-26 LAB — PHOSPHORUS: Phosphorus: 3.1 mg/dL (ref 2.5–4.6)

## 2016-05-26 MED ORDER — VANCOMYCIN HCL 500 MG IV SOLR
500.0000 mg | Freq: Two times a day (BID) | INTRAVENOUS | Status: DC
  Administered 2016-05-26 – 2016-05-28 (×4): 500 mg via INTRAVENOUS
  Filled 2016-05-26 (×6): qty 500

## 2016-05-26 MED ORDER — MAGIC MOUTHWASH W/LIDOCAINE: 15.0000 mL | Freq: Four times a day (QID) | ORAL | Status: DC | PRN

## 2016-05-26 MED ORDER — MAGNESIUM SULFATE 2 GM/50ML IV SOLN
2.0000 g | Freq: Once | INTRAVENOUS | Status: AC
  Administered 2016-05-26: 2 g via INTRAVENOUS
  Filled 2016-05-26: qty 50

## 2016-05-26 NOTE — Progress Notes (Signed)
Pharmacy Antibiotic Note  Alan Warner is a [age] [address].o. male admitted on 05/20/2016 with sepsis, UTI, possible osteomylitis, chronic decubs.  Pharmacy has been consulted for vancomycin and meropenem dosing. SCr/estimated Cl unreliable due to paraplegia, I&O similar since that time.    Vancomycin random level today now 25 around 14.5 hrs after last dose.  Appears to have adequate vancomycin clearance.  Goal trough level 15-20  Plan: Continue Meropenem 1g IV q8hr Vancomycin 500 mg IV q 12 hrs.   Check vancomycin trough at steady state ASAP.   Height: 6' (182.9 cm) Weight: 183 lb 6.8 oz (83.2 kg) IBW/kg (Calculated) : 77.6  Temp (24hrs), Avg:97.8 F (36.6 C), Min:97.5 F (36.4 C), Max:98.4 F (36.9 C)   Recent Labs Lab 05/20/16 2339 05/21/16 0221 05/21/16 0313 05/21/16 2115 05/22/16 0515 05/23/16 1026  05/24/16 0434 05/25/16 0454 05/26/16 0411  WBC  --   --  12.5*  --  12.1*  --   --  5.7 2.5* 2.2*  CREATININE  --   --  0.78 0.74 0.76  --   --  1.09 0.93 0.84  LATICACIDVEN 1.54 0.80  --   --   --   --   --   --   --   --   VANCOTROUGH  --   --   --   --   --  2638*  --   --   --   --   VANCORANDOM  --   --   --   --   --   --   < > 40 23 25  < > = values in this interval not displayed.  Estimated Creatinine Clearance: 111.6 mL/min (by C-G formula based on SCr of 0.84 mg/dL).    Allergies  Allergen Reactions  . Levaquin [Levofloxacin In D5w] Other (See Comments)    PER MEDICATION MAR  . Penicillins Rash and Other (See Comments)    PER MEDICATION MAR  . Vancomycin Other (See Comments)    GI side effects, probably OK to use.     Antimicrobials PTA 12/1 cefepime >> 12/6 PTA 12/1 flagyl >> 12/5 Fluconazole 12/8>>12/15 Vanc 12/6 >> (6 weeks) Meropenem 12/6 >> (6 weeks)  Dose adjustments  12/8: VT 38 - hold 12/9: VR 40 - hold 12/10: VR 23 - given 1 g x 1 12/11: VR 25 (had 1g dose ~14.5 hrs prior to this level)  Cultures: 12/5 urine cx: pseudomonas (60K/ml) - pan S  except to gent 12/5 blood cx: ngtd 12/6 mrsa pcr: neg  Thank you for allowing pharmacy to be a part of this patient's care.  Tad MooreJessica Curtina Grills, Pharm D, BCPS  Clinical Pharmacist Pager [phone]  05/26/2016 9:44 AM
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
## 2016-05-26 NOTE — Care Management Important Message (Signed)
Important Message  Patient Details  Name: Alan Warner MRN: [id number] Date of Birth: 1960/12/12   Medicare Important Message Given:  Yes    Patrich Heinze Abena 05/26/2016, 10:01 AM
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
## 2016-05-27 LAB — CBC
HCT: 30.3 % — ABNORMAL LOW (ref 39.0–52.0)
HEMOGLOBIN: 9.9 g/dL — AB (ref 13.0–17.0)
MCH: 25.8 pg — AB (ref 26.0–34.0)
MCHC: 32.7 g/dL (ref 30.0–36.0)
MCV: 79.1 fL (ref 78.0–100.0)
PLATELETS: 101 10*3/uL — AB (ref 150–400)
RBC: 3.83 MIL/uL — ABNORMAL LOW (ref 4.22–5.81)
RDW: 23.2 % — AB (ref 11.5–15.5)
WBC: 3.3 10*3/uL — ABNORMAL LOW (ref 4.0–10.5)

## 2016-05-27 LAB — MAGNESIUM: MAGNESIUM: 1.8 mg/dL (ref 1.7–2.4)

## 2016-05-27 LAB — BASIC METABOLIC PANEL
ANION GAP: 6 (ref 5–15)
BUN: 14 mg/dL (ref 6–20)
CALCIUM: 7.9 mg/dL — AB (ref 8.9–10.3)
CO2: 21 mmol/L — ABNORMAL LOW (ref 22–32)
CREATININE: 0.86 mg/dL (ref 0.61–1.24)
Chloride: 112 mmol/L — ABNORMAL HIGH (ref 101–111)
GFR calc Af Amer: >60 mL/min (ref >60)
GLUCOSE: 97 mg/dL (ref 65–99)
Potassium: 4.4 mmol/L (ref 3.5–5.1)
Sodium: 139 mmol/L (ref 135–145)

## 2016-05-27 MED ORDER — FUROSEMIDE 10 MG/ML IJ SOLN
40.0000 mg | Freq: Once | INTRAMUSCULAR | Status: AC
  Administered 2016-05-27: 40 mg via INTRAVENOUS
  Filled 2016-05-27: qty 4
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
## 2016-05-27 NOTE — Consult Note (Signed)
WOC Nurse wound follow up  Call from 5W staff regarding orders for the [facility] cath site dressing. I have reassessed the areas today.  Much improvement of the peristomal skin and the inner thighs with the addition of the antifungal and the absorptive dressings, however today patient had moist gauze dressings and staff reports being unclear on the wound care orders. Orders updated with indications to not use moist/wet dressings on any of the skin between the legs or around the SP site.  Wound type:severe MASD inner thighs, scrotum Peristomal MASD at [place] cath site. Wound bed: not as moist at both sites, pink, fungal overgrowth is much improved  Drainage (amount, consistency, odor) serosanguinous  Periwound: intact  Dressing procedure/placement/frequency:  Continue Aquacel Ag+ to the sacral wound, bilateral ischial wounds, inner thighs/scrotum.  Cover with dry dressing on the inner thighs, cover with silicone foam on the sacrum and ischial wounds.  Continue low air loss mattress for moisture management, would be wonderful to have this patient on therapeutic surface in the facility.  Add xeroform around the [facility] cath site for antibacterial and drying effects.   WOC Nurse team will follow along with you for weekly wound assessments.  Please notify me of any acute changes in the wounds or any new areas of concerns Malana Eberwein [facility] MSN, RN,CWOCN, CNS [phone]
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
## 2016-05-28 ENCOUNTER — Inpatient Hospital Stay (HOSPITAL_COMMUNITY): Payer: Medicare Other

## 2016-05-28 DIAGNOSIS — D638 Anemia in other chronic diseases classified elsewhere: Secondary | ICD-10-CM

## 2016-05-28 DIAGNOSIS — I959 Hypotension, unspecified: Secondary | ICD-10-CM

## 2016-05-28 LAB — BASIC METABOLIC PANEL WITH GFR
Anion gap: 6 (ref 5–15)
BUN: 13 mg/dL (ref 6–20)
CO2: 24 mmol/L (ref 22–32)
Calcium: 7.8 mg/dL — ABNORMAL LOW (ref 8.9–10.3)
Chloride: 110 mmol/L (ref 101–111)
Creatinine, Ser: 0.75 mg/dL (ref 0.61–1.24)
GFR calc Af Amer: >60 mL/min
GFR calc non Af Amer: >60 mL/min
Glucose, Bld: 83 mg/dL (ref 65–99)
Potassium: 4.3 mmol/L (ref 3.5–5.1)
Sodium: 140 mmol/L (ref 135–145)

## 2016-05-28 LAB — CBC
HCT: 28.4 % — ABNORMAL LOW (ref 39.0–52.0)
Hemoglobin: 9.3 g/dL — ABNORMAL LOW (ref 13.0–17.0)
MCH: 26 pg (ref 26.0–34.0)
MCHC: 32.7 g/dL (ref 30.0–36.0)
MCV: 79.3 fL (ref 78.0–100.0)
Platelets: 113 K/uL — ABNORMAL LOW (ref 150–400)
RBC: 3.58 MIL/uL — ABNORMAL LOW (ref 4.22–5.81)
RDW: 23.6 % — ABNORMAL HIGH (ref 11.5–15.5)
WBC: 3.1 K/uL — ABNORMAL LOW (ref 4.0–10.5)

## 2016-05-28 LAB — VANCOMYCIN, TROUGH: VANCOMYCIN TR: 24 ug/mL — AB (ref 15–20)

## 2016-05-28 MED ORDER — GUAIFENESIN ER 600 MG PO TB12
600.0000 mg | ORAL_TABLET | Freq: Two times a day (BID) | ORAL | Status: DC
  Administered 2016-05-28 – 2016-05-29 (×2): 600 mg via ORAL
  Filled 2016-05-28 (×2): qty 1

## 2016-05-28 MED ORDER — VANCOMYCIN HCL IN DEXTROSE 750-5 MG/150ML-% IV SOLN
750.0000 mg | INTRAVENOUS | Status: DC
  Administered 2016-05-28: 750 mg via INTRAVENOUS
  Filled 2016-05-28 (×2): qty 150

## 2016-05-28 MED ORDER — SODIUM CHLORIDE 0.9 % IV BOLUS (SEPSIS)
500.0000 mL | Freq: Once | INTRAVENOUS | Status: AC
  Administered 2016-05-28: 500 mL via INTRAVENOUS

## 2016-05-28 MED ORDER — MIDODRINE HCL 5 MG PO TABS
5.0000 mg | ORAL_TABLET | Freq: Three times a day (TID) | ORAL | Status: DC
  Administered 2016-05-28 – 2016-05-29 (×4): 5 mg via ORAL
  Filled 2016-05-28 (×4): qty 1

## 2016-05-28 MED ORDER — GUAIFENESIN-DM 100-10 MG/5ML PO SYRP: 5.0000 mL | ORAL_SOLUTION | ORAL | Status: DC | PRN

## 2016-05-28 MED ORDER — SODIUM CHLORIDE 0.9 % IV SOLN
1.0000 g | Freq: Two times a day (BID) | INTRAVENOUS | Status: DC
  Administered 2016-05-29 (×2): 1 g via INTRAVENOUS
  Filled 2016-05-28 (×4): qty 1

## 2016-05-28 NOTE — Progress Notes (Signed)
Pharmacy Antibiotic Note  Alan Warner is a 55 y.o. male admitted on 05/20/2016 with sepsis, UTI, possible osteomylitis, chronic decubitus.  Pharmacy was consulted on 05/21/16  for vancomycin and meropenem dosing. SCr/estimated CrCl unreliable due to paraplegia, I&O similar since that time.    Vancomycin trough today= 24 mcg/ml, drawn at appropriate time.   Goal trough level 15-20.  I will adjust vancomycin dose again, will change to a q24h dose instead of q12h.  And adjust meropenem dose as appears to be clearing vancomycin slower than anticipated.   Plan: Decrease Vancomycin to 750 mg IV q 24 hrs will start tonight @22 :00  Decrease Meropenem 1g IV q12hr Monitor clinical progress, renal function. Vancomycin trough at steady state on new dosage.  Height: 6' (182.9 cm) Weight: 159 lb (72.1 kg) IBW/kg (Calculated) : 77.6  Temp (24hrs), Avg:98 F (36.7 C), Min:97.5 F (36.4 C), Max:98.8 F (37.1 C)   Recent Labs Lab 05/23/16 1026  05/24/16 0434 05/25/16 0454 05/26/16 0411 05/27/16 0729 05/28/16 0417 05/28/16 1403  WBC  --   --  5.7 2.5* 2.2* 3.3* 3.1*  --   CREATININE  --   --  1.09 0.93 0.84 0.86 0.75  --   VANCOTROUGH 38*  --   --   --   --   --   --  24*  VANCORANDOM  --   < > 40 23 25  --   --   --   < > = values in this interval not displayed.  Estimated Creatinine Clearance: 108.9 mL/min (by C-G formula based on SCr of 0.75 mg/dL).    Allergies  Allergen Reactions  . Levaquin [Levofloxacin In D5w] Other (See Comments)    PER MEDICATION MAR  . Penicillins Rash and Other (See Comments)    PER MEDICATION MAR  . Vancomycin Other (See Comments)    GI side effects, probably OK to use.     Antimicrobials PTA 12/1 cefepime >> 12/6 PTA 12/1 flagyl >> 12/5 Fluconazole 12/8>>12/15 Vanc 12/6 >> (6 weeks) Meropenem 12/6 >> (6 weeks)  Dose adjustments  12/8: VT 38 - hold 12/9: VR 40 - hold 12/10: VR 23 - given 1 g x 1 12/11: VR 25 (had 1g dose ~14.5 hrs prior to this  level) 12/13: VT = 24 (on 500mg  IV q12h)   Cultures: 12/5 urine cx: pseudomonas (60K/ml) - pan S except to gent 12/5 blood cx: negative 12/6 mrsa pcr: neg 12/12 BCx: pending  Alan Warner, RPh Clinical Pharmacist [phone] until 4 PM , then [phone] or [phone] Thank you for allowing pharmacy to be a part of this patient's care.  05/28/2016 3:10 PM

## 2016-05-28 NOTE — Progress Notes (Signed)
Patient blood pressure 75/49.  Notified the on call NP K. Craige CottaKirby.
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
## 2016-05-28 NOTE — Progress Notes (Signed)
Pt lethargic but arousal, BP 82/42. MD paged.

## 2016-05-28 NOTE — Progress Notes (Signed)
BP 75/35, pt lethargic, RR team notified.

## 2016-05-29 MED ORDER — WHITE PETROLATUM GEL: 1.0000 "application " | Freq: Four times a day (QID) | 0 refills | Status: AC

## 2016-05-29 MED ORDER — HEPARIN SOD (PORK) LOCK FLUSH 100 UNIT/ML IV SOLN
250.0000 [IU] | INTRAVENOUS | Status: AC | PRN
  Administered 2016-05-29: 250 [IU]

## 2016-05-29 MED ORDER — GUAIFENESIN-DM 100-10 MG/5ML PO SYRP: 5.0000 mL | ORAL_SOLUTION | ORAL | 0 refills | Status: AC | PRN

## 2016-05-29 MED ORDER — LORAZEPAM 0.5 MG PO TABS: 0.5000 mg | ORAL_TABLET | Freq: Four times a day (QID) | ORAL | 0 refills | Status: AC | PRN

## 2016-05-29 MED ORDER — SODIUM CHLORIDE 0.9% FLUSH: 10.0000 mL | INTRAVENOUS | Status: DC | PRN

## 2016-05-29 MED ORDER — SODIUM CHLORIDE 0.9 % IV SOLN: 1.0000 g | Freq: Two times a day (BID) | INTRAVENOUS | Status: AC

## 2016-05-29 MED ORDER — MAGIC MOUTHWASH W/LIDOCAINE: 15.0000 mL | Freq: Four times a day (QID) | ORAL | 0 refills | Status: AC | PRN

## 2016-05-29 MED ORDER — VANCOMYCIN HCL IN DEXTROSE 750-5 MG/150ML-% IV SOLN: 750.0000 mg | INTRAVENOUS | Status: AC

## 2016-05-29 MED ORDER — MAGNESIUM OXIDE 400 (241.3 MG) MG PO TABS: 400.0000 mg | ORAL_TABLET | Freq: Two times a day (BID) | ORAL | Status: AC

## 2016-05-29 MED ORDER — NYSTATIN 100000 UNIT/GM EX OINT: TOPICAL_OINTMENT | Freq: Three times a day (TID) | CUTANEOUS | 0 refills | Status: AC

## 2016-05-29 MED ORDER — MIDODRINE HCL 5 MG PO TABS: 5.0000 mg | ORAL_TABLET | Freq: Three times a day (TID) | ORAL | Status: AC

## 2016-05-29 MED ORDER — OXYCODONE HCL 30 MG PO TABS: 30.0000 mg | ORAL_TABLET | ORAL | 0 refills | Status: AC | PRN

## 2016-05-29 MED ORDER — GUAIFENESIN ER 600 MG PO TB12: 600.0000 mg | ORAL_TABLET | Freq: Two times a day (BID) | ORAL | Status: AC
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
## 2016-05-29 NOTE — Care Management Important Message (Signed)
Important Message  Patient Details  Name: Alan LaineJerry W Defeo MRN: [id number] Date of Birth: 1960-12-08   Medicare Important Message Given:  Yes    Kyla BalzarineShealy, Chima Astorino Abena 05/29/2016, 9:55 AM

## 2016-05-29 NOTE — Progress Notes (Signed)
IV removed and report given to Gardner CandleJessica Darvey, RN at [facility]. Alan CarboAubrey  Zeven Warner

## 2016-05-29 NOTE — Progress Notes (Signed)
RN called for BP 75/35, Craige CottaKirby NP paged pta, 500 cc ns bolus ordered and in process upon my arrival. BP 86/47. Pt sleeping, easily awakened with verbal stimuli, answers all questions appropriately, a/o x4. Pt repositioned in bed, BP 99/58. Pt's norm SBP 80-90's. He has had some SBP 70's this visit. No interventions from this RN at this time.  Ivana RN Encouraged to call with any concerns.

## 2016-05-29 NOTE — Progress Notes (Signed)
Pt requested pain med, transportation at bedside to take pt to SNF, BP 100/60, SpO2 96 on 2L Ellin Sabanc. Kirby, NP paged, ok to give pain med.  Pt sent to SNF via EMS at 0943, no signs of acute distress, pt fully alert and oriented.

## 2016-05-29 NOTE — Progress Notes (Signed)
RR nurse assessed pt, will fallow as needed. Bolus 500 cc completed, BP 98/56, HR 74. Craige CottaKirby, NP notified. Pt sleepy, easy to aurous, oriented x 4. Will continue to monitor.

## 2016-05-29 NOTE — Progress Notes (Signed)
Patient will DC to: [facility] Anticipated DC date: 05/29/16 Family notified: Patient notifying family Transport by: Sharin MonsPTAR   Per MD patient ready for DC to [facility]. RN, patient, patient's family, and facility notified of DC. Discharge Summary sent to facility. RN given number for report. DC packet on chart. Ambulance transport requested for patient.   CSW signing off.  Cristobal GoldmannNadia Almendra Loria, [place] Clinical Social Worker [phone]

## 2016-05-29 NOTE — Progress Notes (Signed)
Upon going to transfer patient Alan Warner reported to writer that patient had a large BM and dressings were soiled. Alan Warner left and said they would return for patient. Writer changed all dressings and cleaned patient. Will pass on to night shift Rn. Lenord CarboAubrey  Carlos Quackenbush

## 2016-05-29 NOTE — Progress Notes (Addendum)
Belongings gathered and verified with patient. [facility].  Alan CarboAubrey  Alan Warner

## 2016-05-29 NOTE — Progress Notes (Signed)
Peripherally Inserted Central Catheter/Midline Placement  The IV Nurse has discussed with the patient and/or persons authorized to consent for the patient, the purpose of this procedure and the potential benefits and risks involved with this procedure.  The benefits include less needle sticks, lab draws from the catheter, and the patient may be discharged home with the catheter. Risks include, but not limited to, infection, bleeding, blood clot (thrombus formation), and puncture of an artery; nerve damage and irregular heartbeat and possibility to perform a PICC exchange if needed/ordered by physician.  Alternatives to this procedure were also discussed.  B[employer] PICC patient education guide, fact sheet on infection prevention and patient information card has been provided to patient /or left at bedside.    PICC/Midline Placement Documentation        Alan Warner, Alan Warner 05/29/2016, 9:31 AM

## 2016-06-01 LAB — CULTURE, BLOOD (ROUTINE X 2)
CULTURE: NO GROWTH
Culture: NO GROWTH

## 2016-06-16 DEATH — deceased

## 2017-01-31 IMAGING — US US RENAL
1 series · 14 of 25 positions shown · non-contrast
Comparison: CT abdomen and pelvis April 24, 2015

CLINICAL DATA: Recent right-sided hydronephrosis

EXAM:
RENAL / URINARY TRACT ULTRASOUND COMPLETE

[Series 1: us renal · 0.24mm/px · 14 of 28 slices shown]
[im 1/28]
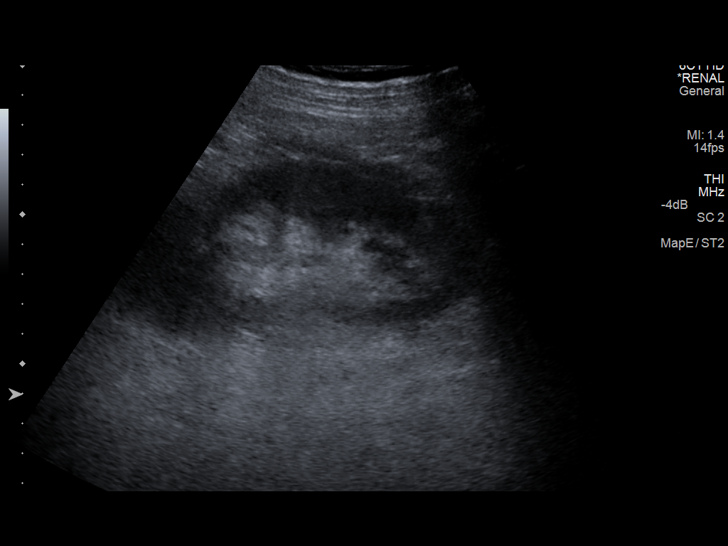
[im 3/28]
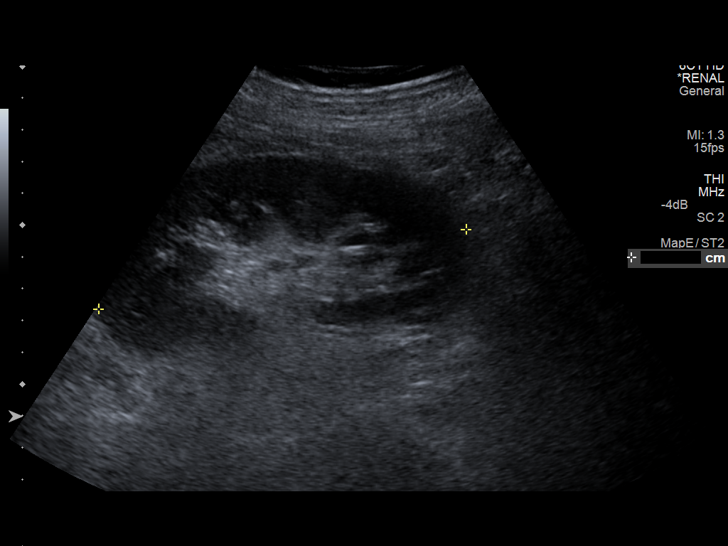
[im 5/28]
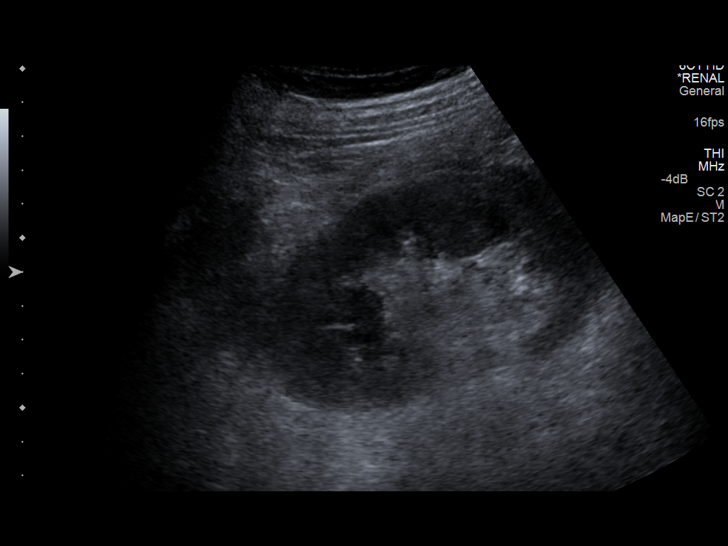
[im 7/28]
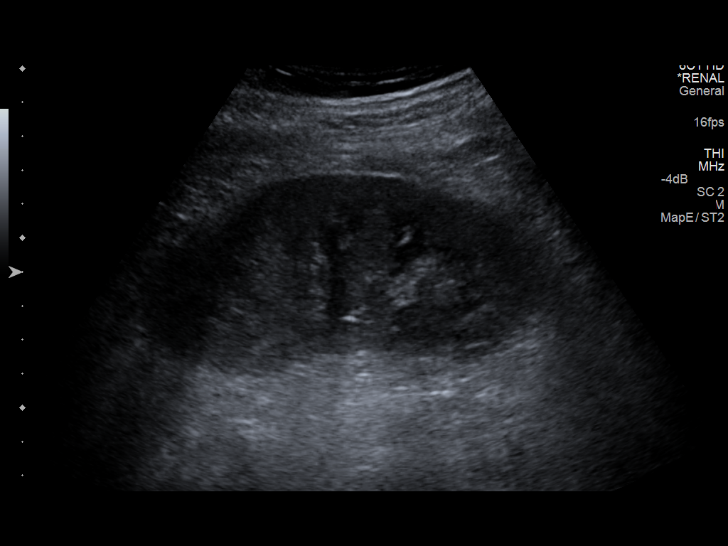
[im 10/28]
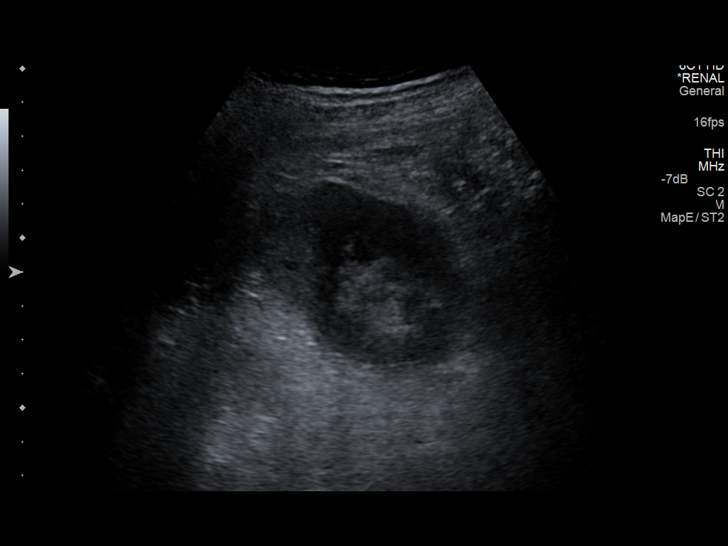
[im 11/28]
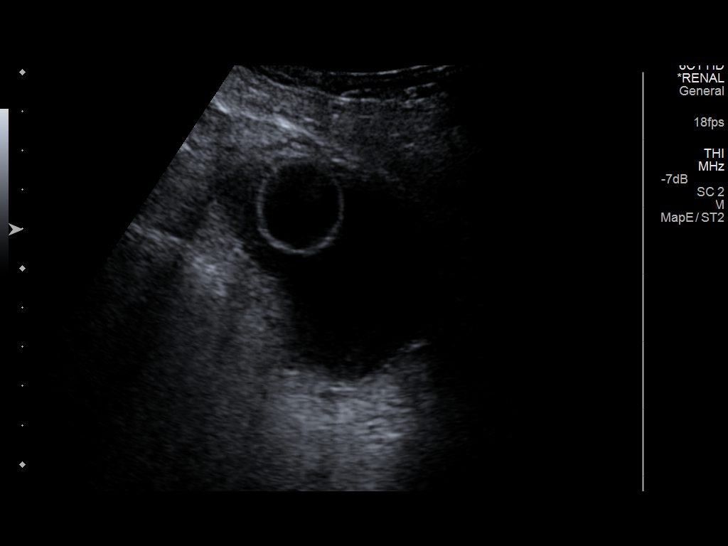
[im 13/28]
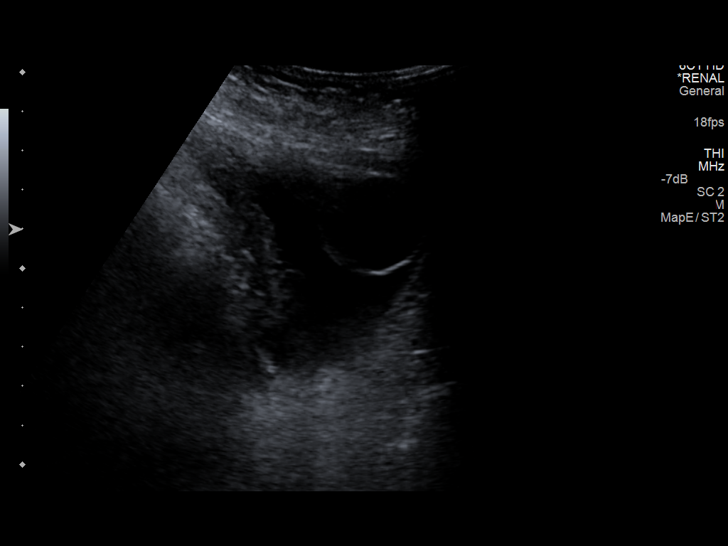
[im 15/28]
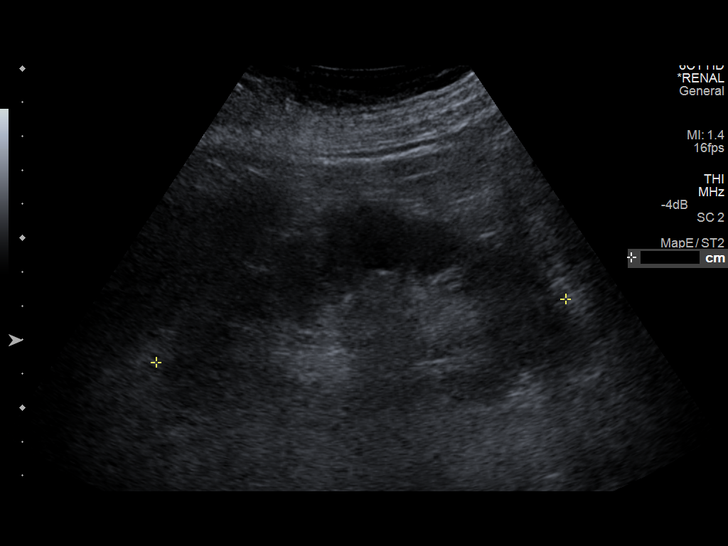
[im 17/28]
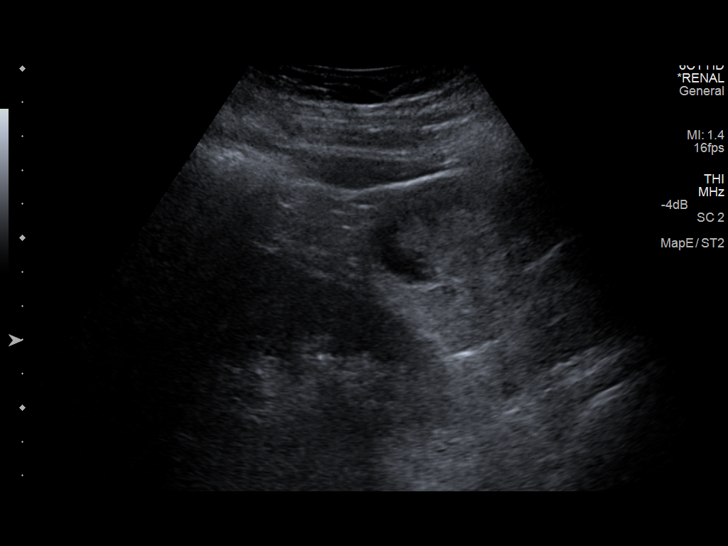
[im 19/28]
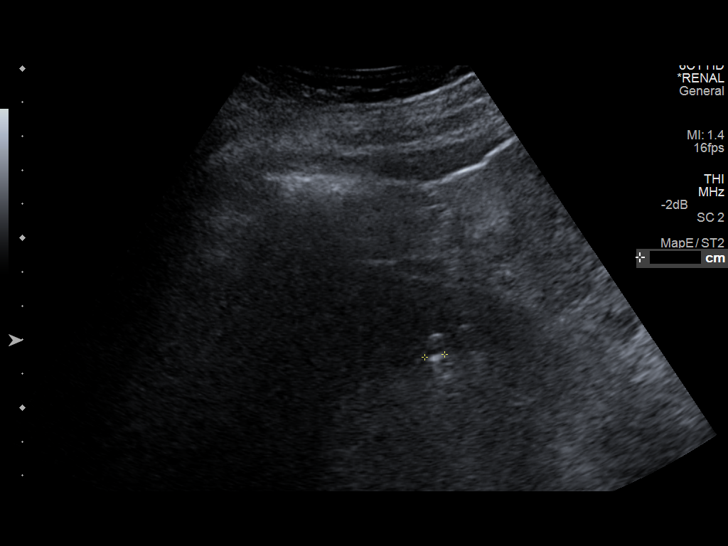
[im 21/28]
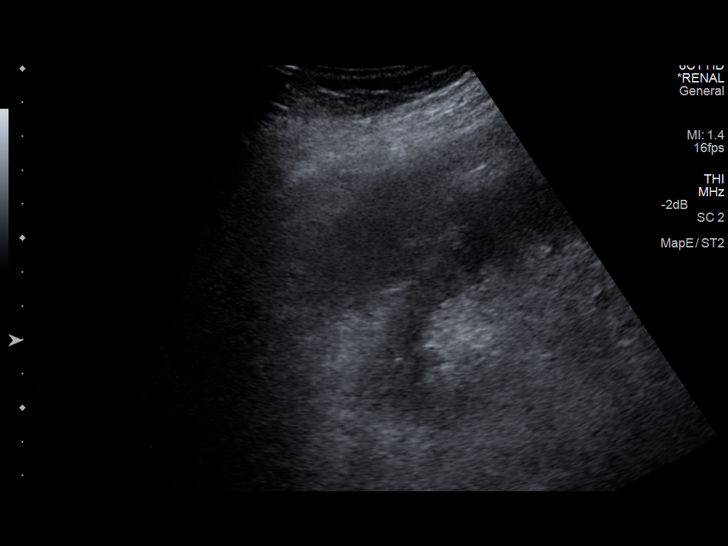
[im 23/28]
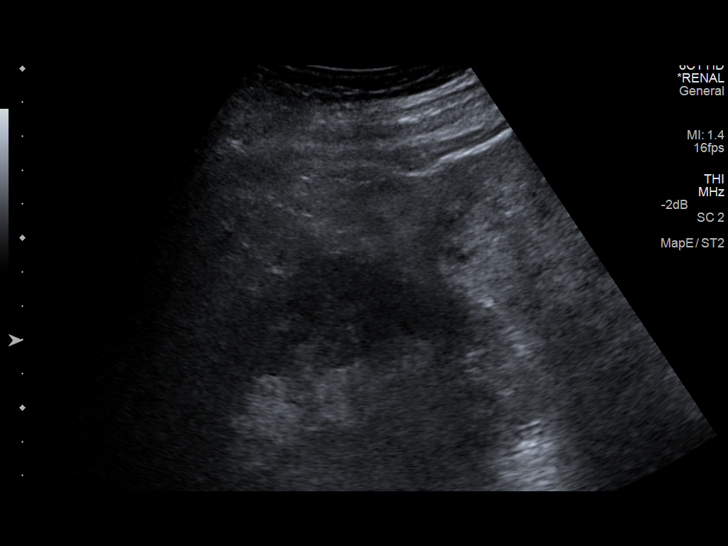
[im 25/28]
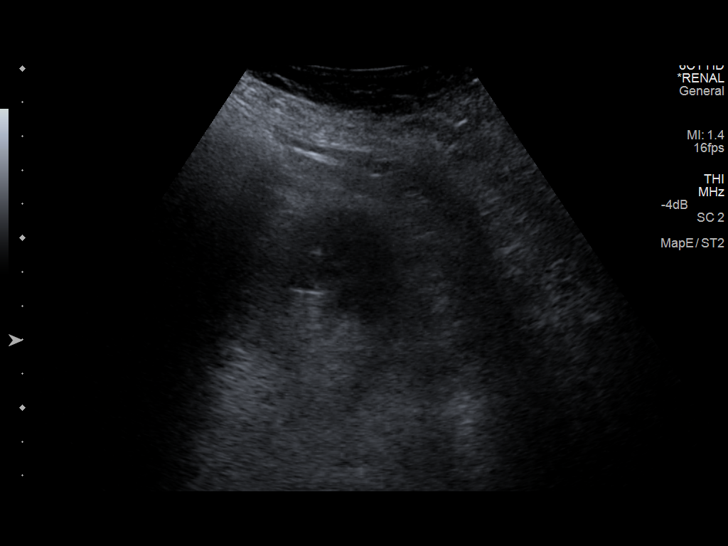
[im 28/28]
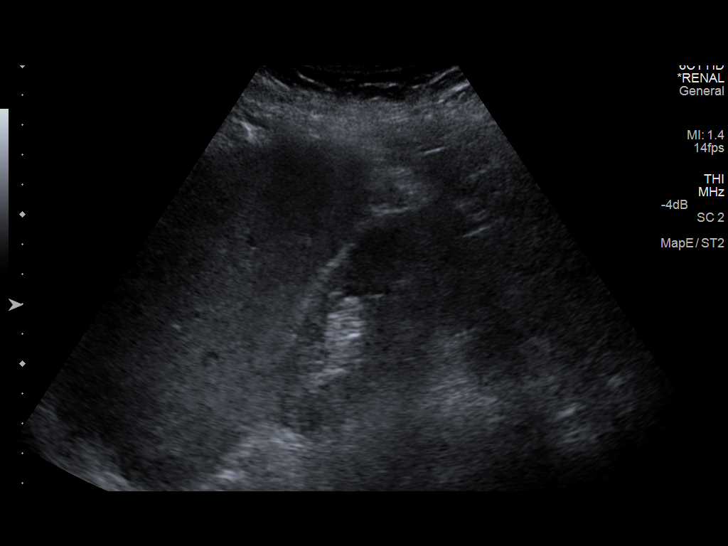

[14 of 25 positions shown; findings below may reference images not displayed]

FINDINGS: Right Kidney:

Length: 11.8 cm. Echogenicity and renal cortical thickness are
within normal limits. No mass, perinephric fluid, or hydronephrosis
visualized. The recently noted hydronephrosis on the right has
resolved. There is a 4 mm calculus in the lower pole of the right
kidney. No ureterectasis.

Left Kidney:

Length: 12.8 cm. Echogenicity and renal cortical thickness are
within normal limits. No mass, perinephric fluid, or hydronephrosis
visualized. There is a calculus in the lower pole the left kidney
measuring 7 mm. No ureterectasis.

Bladder:

Appears normal for degree of bladder distention. Suprapubic catheter
present.
IMPRESSION: Intrarenal calculi bilaterally. Currently no
pelvicaliectasis/hydronephrosis. The hydronephrosis on the right has
resolved since 1 day prior. Renal cortical thickness and
echogenicity are normal. Suprapubic catheter in place.

## 2017-09-15 IMAGING — CR DG CHEST 2V
2 series · 2 of 2 positions shown · non-contrast
Comparison: 10/08/2014

CLINICAL DATA: Cough

EXAM:
CHEST  2 VIEW

[w chest lat]
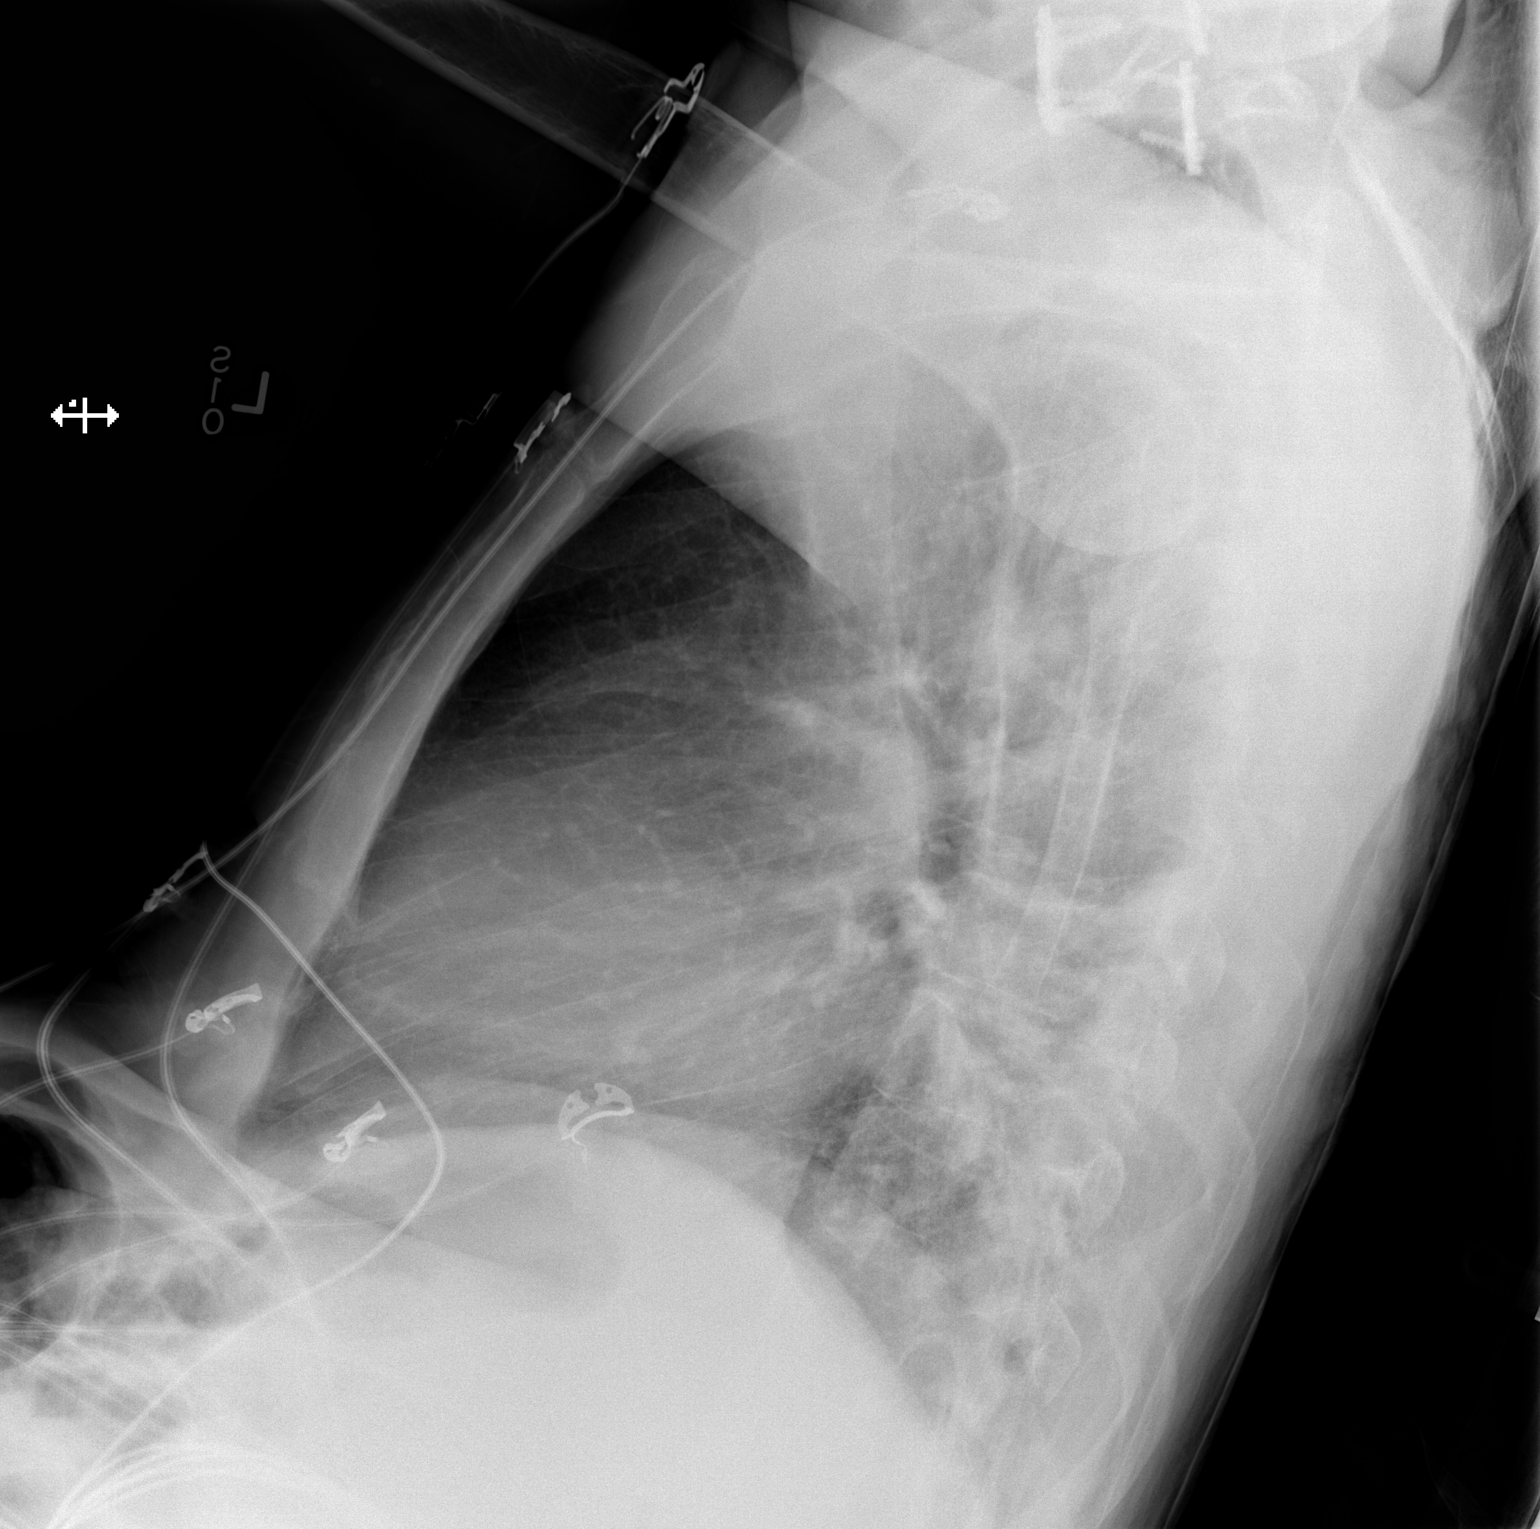

[x chest ap]
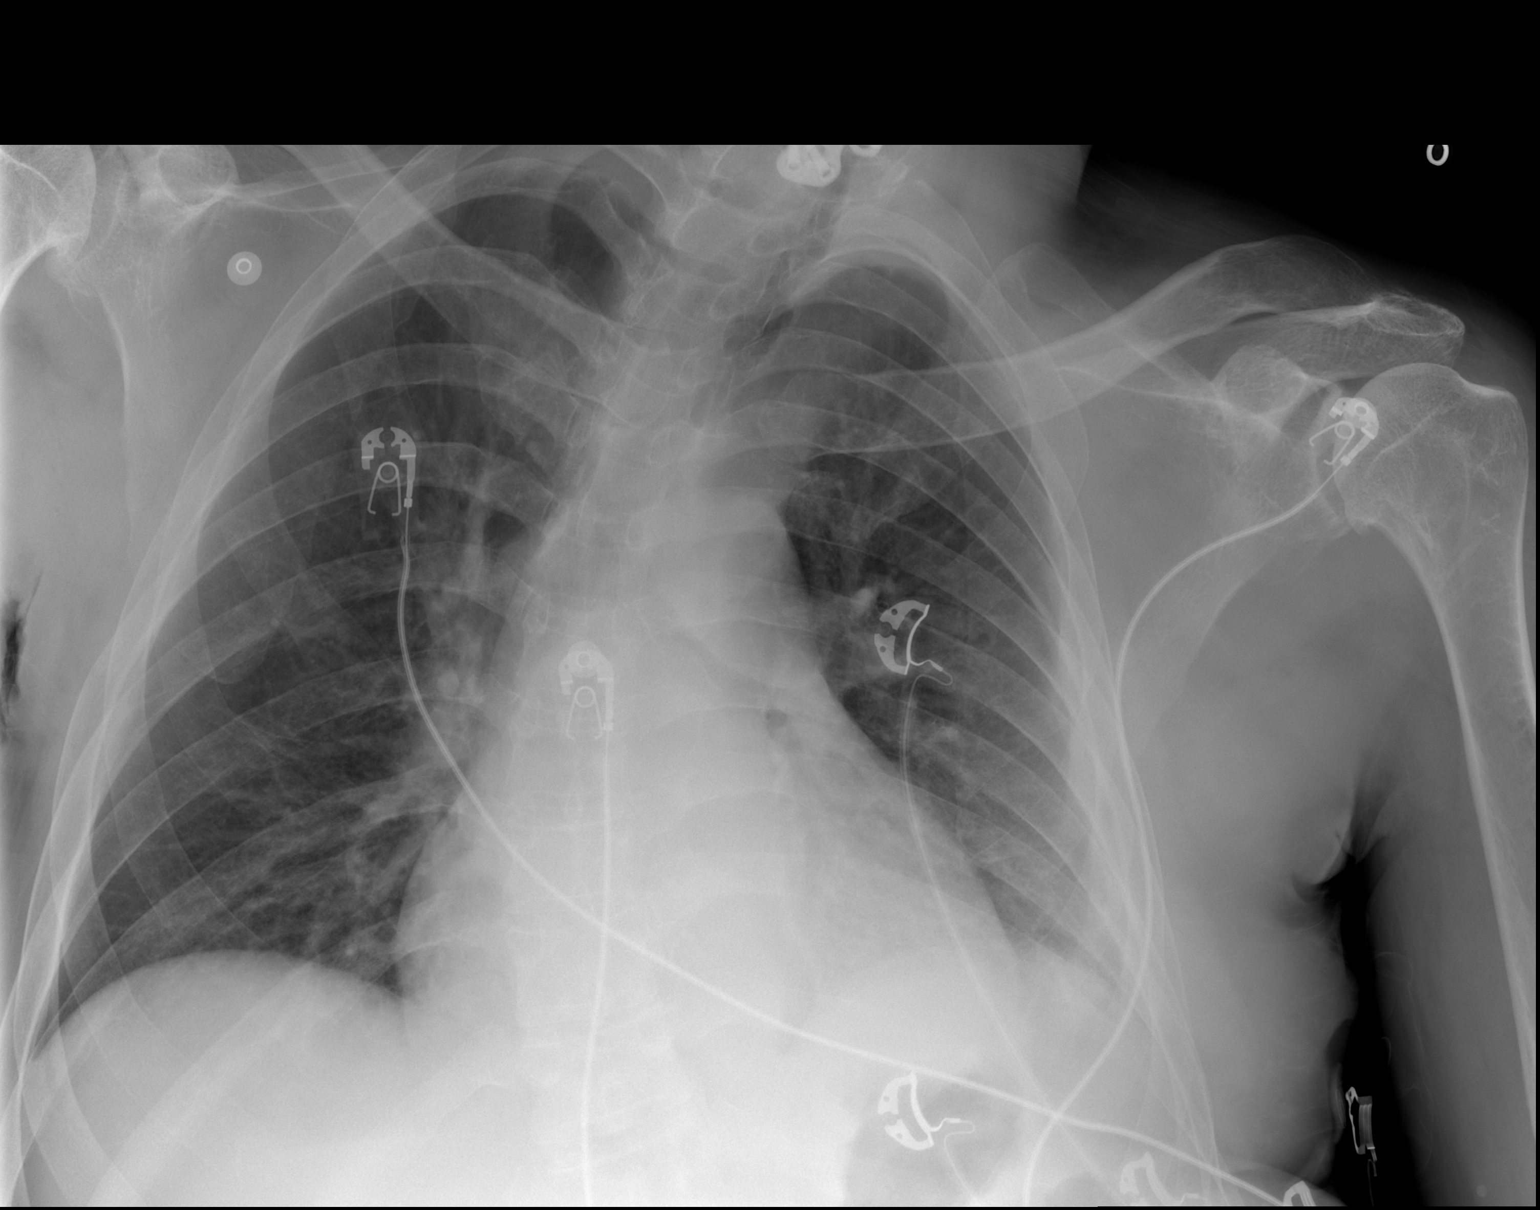

[2 of 2 positions shown; findings below may reference images not displayed]

FINDINGS: Cardiomegaly. Minimal left base opacity likely reflects atelectasis.
No focal opacity on the right. No effusions. No acute bony
abnormality.
IMPRESSION: Cardiomegaly.  Minimal left basilar opacity, likely atelectasis.

## 2017-09-15 IMAGING — CT CT ABD-PELV W/ CM
2 of 5 series · 16 of 46 positions shown, 18 images · IV contrast (OMNIPAQUE 300)
Comparison: 11/02/2014

CLINICAL DATA: Chronic pain, left lower quadrant pain, chest
burning, tobacco abuse, paraplegia

EXAM:
CT ABDOMEN AND PELVIS WITH CONTRAST
TECHNIQUE: Multidetector CT imaging of the abdomen and pelvis was performed
using the standard protocol following bolus administration of
intravenous contrast.
CONTRAST:  100mL OMNIPAQUE IOHEXOL 300 MG/ML  SOLN

[Series 2: abd/pel with · axial · 0.82mm/px · z∈[-712,-286]mm · 13 of 95 slices shown, 15 images]
[im 5/95  soft-tissue]
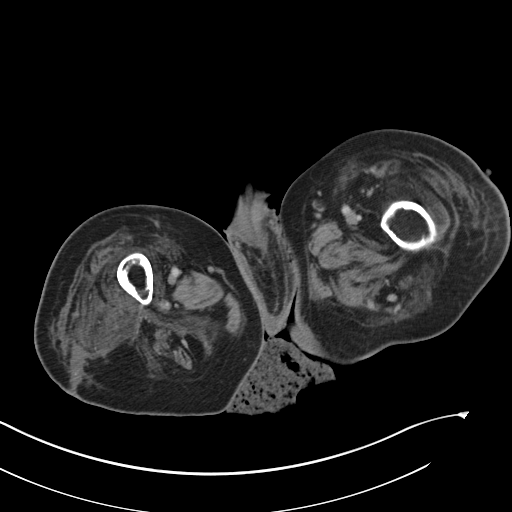
[im 5/95  bone]
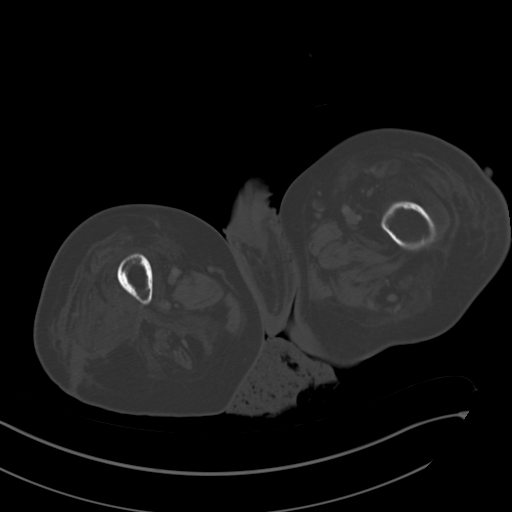
[im 15/95  soft-tissue]
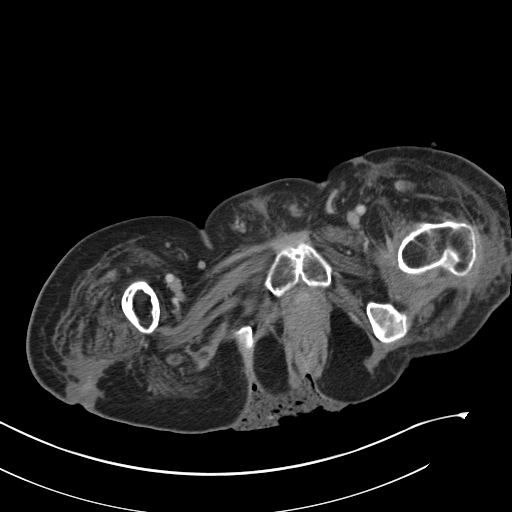
[im 19/95  soft-tissue]
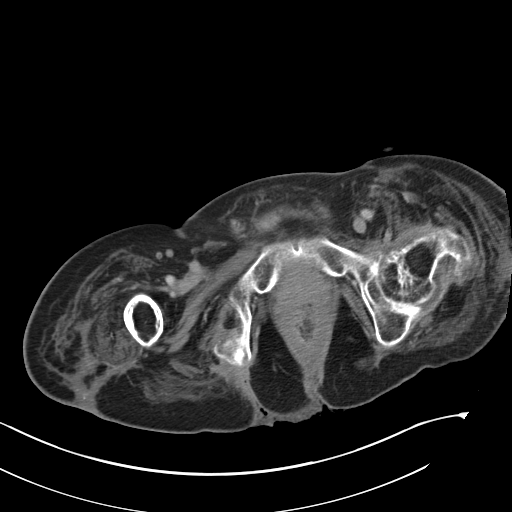
[im 29/95  soft-tissue]
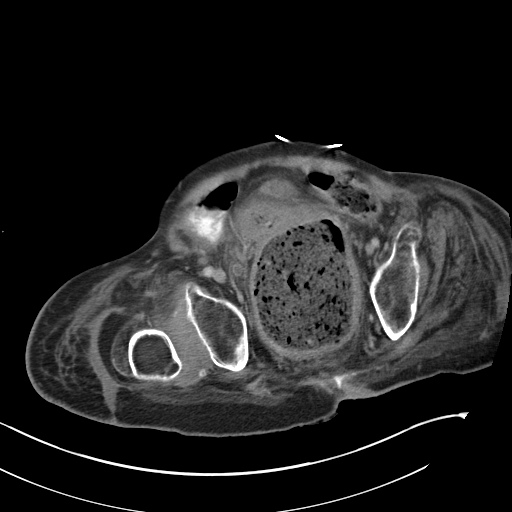
[im 33/95  soft-tissue]
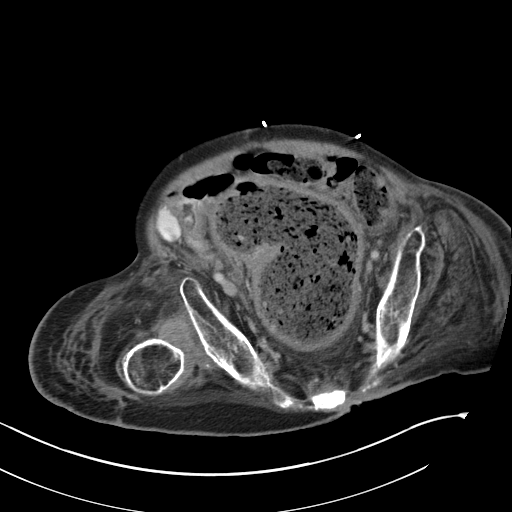
[im 43/95  soft-tissue]
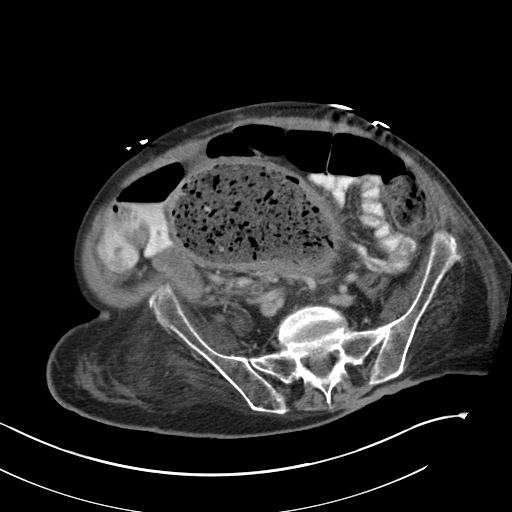
[im 48/95  soft-tissue]
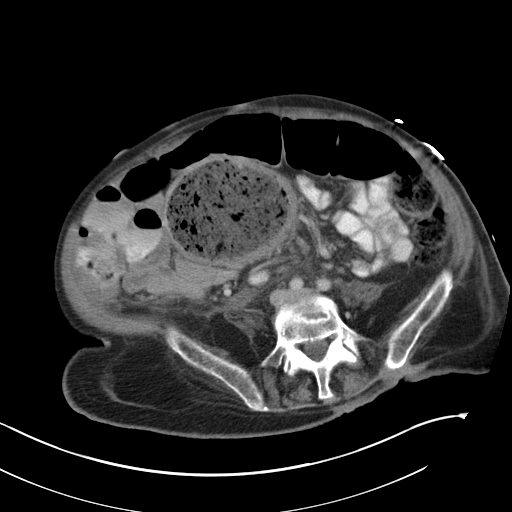
[im 52/95  soft-tissue]
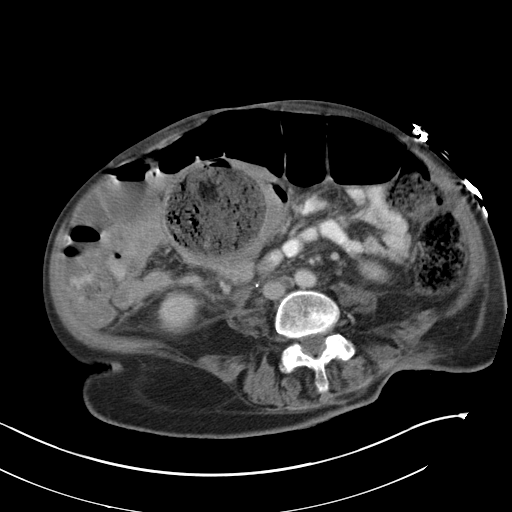
[im 62/95  soft-tissue]
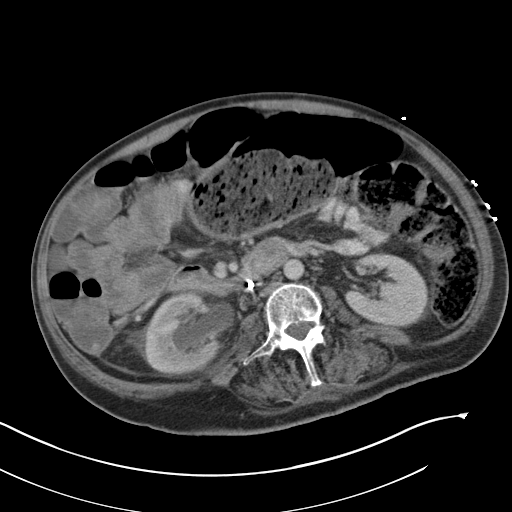
[im 62/95  bone]
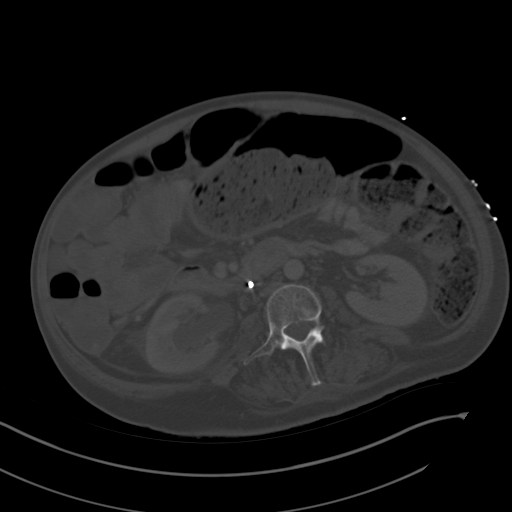
[im 66/95  soft-tissue]
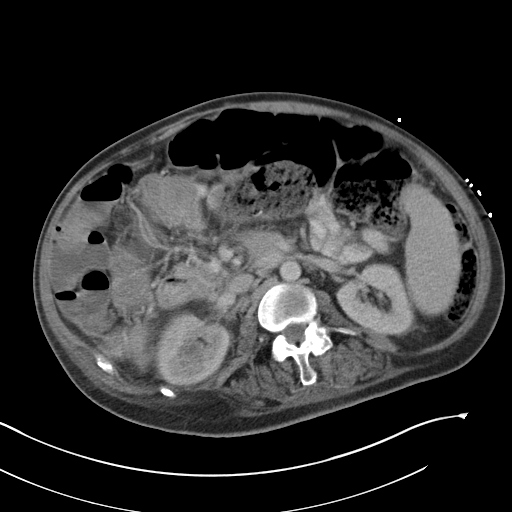
[im 76/95  soft-tissue]
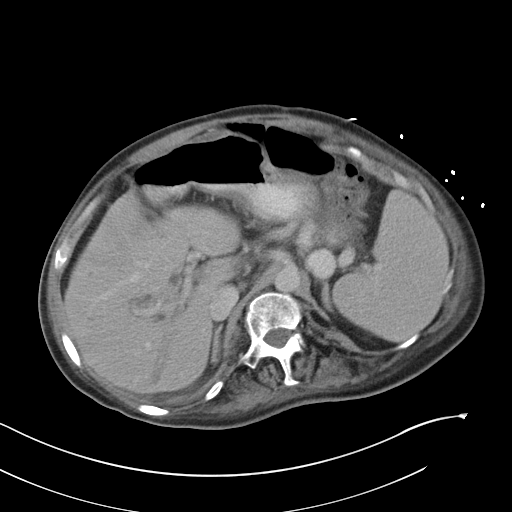
[im 80/95  soft-tissue]
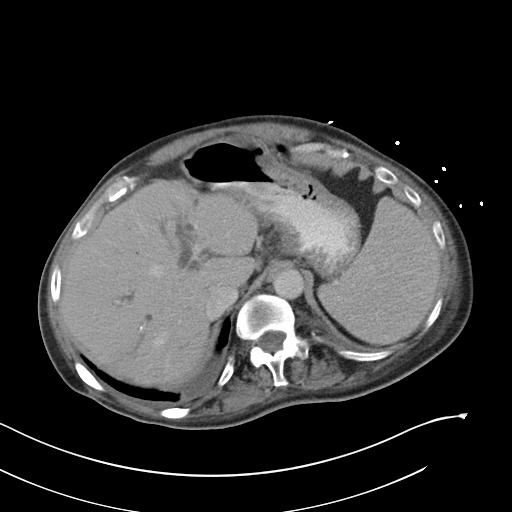
[im 90/95  soft-tissue]
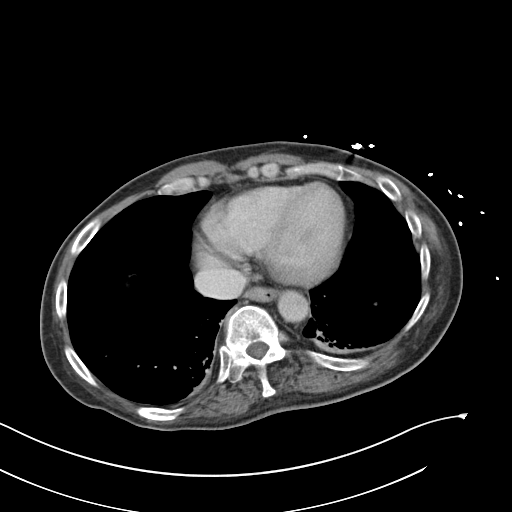

[Series 3: coronal a/|p · coronal · 0.81mm/px · 3 of 100 slices shown]
[im 34/100  soft-tissue]
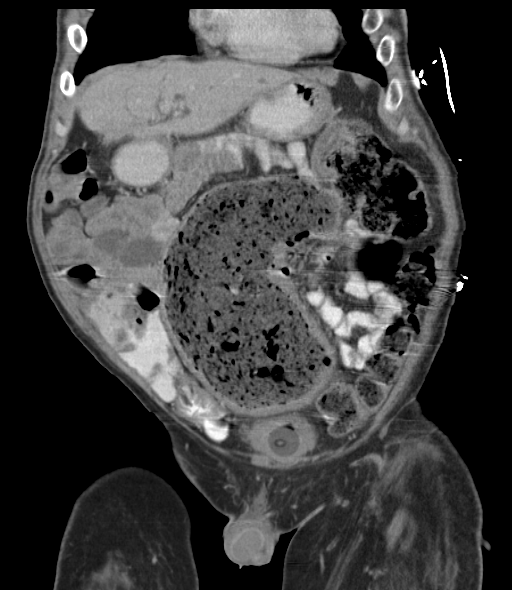
[im 45/100  soft-tissue]
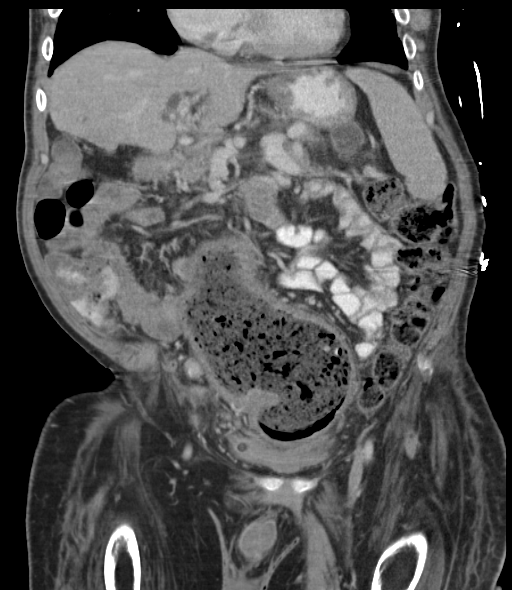
[im 56/100  soft-tissue]
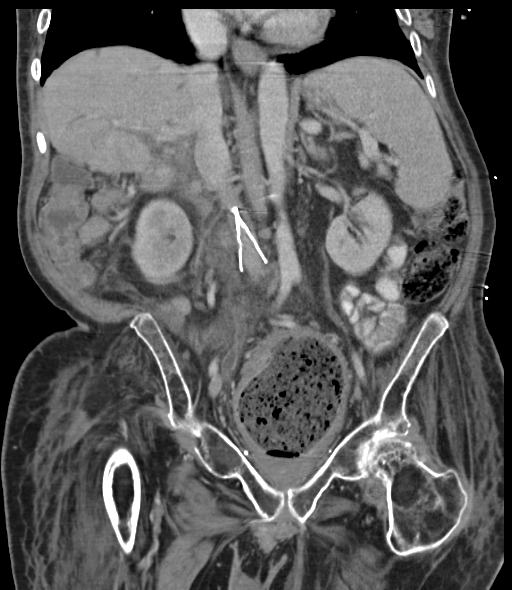

[16 of 46 positions shown; findings below may reference images not displayed]

FINDINGS: Mild subsegmental dependent atelectasis in the visualized lung bases
left greater than right. Intrahepatic central biliary ductal
dilatation as before. No focal liver lesion. Prominent spleen.
Unremarkable adrenal glands.

Left nephrolithiasis, largest stone or cluster 8 mm in lower pole.

Right nephrolithiasis with a 4 mm calculus in the lower pole. There
is moderate right hydronephrosis and ureterectasis down nearly to
the ureteral orifice without calculus. Urinary bladder decompressed
by suprapubic catheter, directed towards the right ureteral orifice.

Pancreatic parenchymal atrophy with scattered calcifications.
Infrarenal IVC filter. Stomach and small bowel are nondilated. There
is the large amount of stool in the splenic flexure and descending
colon, and marked colonic dilatation of the sigmoid colon to
diameter of 10.6 cm, with circumferential wall thickening in the mid
and distal sigmoid colon and rectum, which has increased since
previous exam. Adjacent inflammatory/edematous changes.

No ascites.  No free air.  No adenopathy localized.

Chronic right hip dislocation.
IMPRESSION: 1. New moderate right hydronephrosis and ureterectasis to the
ureteral orifice, possibly obstructed by the tip of the suprapubic
catheter. No ureteral calculus identified .
2. Progressive fecal distention, wall thickening, and adjacent
inflammatory changes suggesting Stercoral colitis in the sigmoid
colon and rectum.
